# Patient Record
Sex: Female | Born: 1960 | Race: White | Hispanic: No | Smoking: Current every day smoker
Health system: Southern US, Community
[De-identification: ages and names within clinical notes are randomized; demographics above are authoritative.]

## PROBLEM LIST (undated history)

## (undated) DIAGNOSIS — I10 Essential (primary) hypertension: Secondary | ICD-10-CM

## (undated) DIAGNOSIS — E119 Type 2 diabetes mellitus without complications: Secondary | ICD-10-CM

## (undated) HISTORY — PX: ABDOMINAL HYSTERECTOMY: SHX81

## (undated) HISTORY — DX: Essential (primary) hypertension: I10

## (undated) HISTORY — DX: Type 2 diabetes mellitus without complications: E11.9

---

## 1997-06-03 ENCOUNTER — Emergency Department (HOSPITAL_COMMUNITY): Admission: EM | Admit: 1997-06-03 | Discharge: 1997-06-03 | Payer: Self-pay | Admitting: Emergency Medicine

## 1998-05-24 ENCOUNTER — Ambulatory Visit (HOSPITAL_COMMUNITY): Admission: RE | Admit: 1998-05-24 | Discharge: 1998-05-24 | Payer: Self-pay | Admitting: *Deleted

## 1998-05-24 ENCOUNTER — Encounter: Payer: Self-pay | Admitting: Family Medicine

## 1999-04-10 ENCOUNTER — Other Ambulatory Visit: Admission: RE | Admit: 1999-04-10 | Discharge: 1999-04-10 | Payer: Self-pay | Admitting: Obstetrics and Gynecology

## 1999-05-30 ENCOUNTER — Ambulatory Visit (HOSPITAL_COMMUNITY): Admission: RE | Admit: 1999-05-30 | Discharge: 1999-05-30 | Payer: Self-pay | Admitting: Obstetrics and Gynecology

## 2000-07-15 ENCOUNTER — Encounter: Payer: Self-pay | Admitting: Internal Medicine

## 2000-07-15 ENCOUNTER — Encounter: Admission: RE | Admit: 2000-07-15 | Discharge: 2000-07-15 | Payer: Self-pay | Admitting: Internal Medicine

## 2001-04-18 ENCOUNTER — Other Ambulatory Visit: Admission: RE | Admit: 2001-04-18 | Discharge: 2001-04-18 | Payer: Self-pay | Admitting: Obstetrics and Gynecology

## 2001-05-02 ENCOUNTER — Observation Stay (HOSPITAL_COMMUNITY): Admission: RE | Admit: 2001-05-02 | Discharge: 2001-05-03 | Payer: Self-pay | Admitting: Obstetrics and Gynecology

## 2005-03-19 ENCOUNTER — Other Ambulatory Visit: Admission: RE | Admit: 2005-03-19 | Discharge: 2005-03-19 | Payer: Self-pay | Admitting: Obstetrics and Gynecology

## 2010-05-23 ENCOUNTER — Ambulatory Visit: Payer: Self-pay | Admitting: Cardiology

## 2018-12-22 ENCOUNTER — Other Ambulatory Visit (HOSPITAL_COMMUNITY): Payer: Self-pay | Admitting: Obstetrics and Gynecology

## 2018-12-22 DIAGNOSIS — R011 Cardiac murmur, unspecified: Secondary | ICD-10-CM

## 2018-12-27 ENCOUNTER — Ambulatory Visit (HOSPITAL_COMMUNITY): Payer: 59

## 2019-02-14 ENCOUNTER — Encounter: Payer: Self-pay | Admitting: Obstetrics and Gynecology

## 2019-02-21 ENCOUNTER — Encounter: Payer: Self-pay | Admitting: General Practice

## 2019-03-06 ENCOUNTER — Encounter: Payer: Self-pay | Admitting: Obstetrics and Gynecology

## 2019-03-13 ENCOUNTER — Telehealth: Payer: Self-pay

## 2019-03-13 NOTE — Telephone Encounter (Signed)
NOTES ON FILE FROM DR JOHN MCCOMB [phone], REFERRAL SENT TO SCHEDULING 

## 2019-04-03 ENCOUNTER — Encounter (INDEPENDENT_AMBULATORY_CARE_PROVIDER_SITE_OTHER): Payer: Self-pay

## 2019-04-03 ENCOUNTER — Ambulatory Visit: Payer: 59 | Admitting: Internal Medicine

## 2019-04-03 ENCOUNTER — Other Ambulatory Visit: Payer: Self-pay

## 2019-04-03 ENCOUNTER — Encounter: Payer: Self-pay | Admitting: Internal Medicine

## 2019-04-03 VITALS — BP 152/64 | HR 53 | Temp 94.8°F | Ht 63.0 in | Wt 152.8 lb

## 2019-04-03 DIAGNOSIS — I10 Essential (primary) hypertension: Secondary | ICD-10-CM | POA: Diagnosis not present

## 2019-04-03 DIAGNOSIS — I358 Other nonrheumatic aortic valve disorders: Secondary | ICD-10-CM | POA: Diagnosis not present

## 2019-04-03 DIAGNOSIS — R001 Bradycardia, unspecified: Secondary | ICD-10-CM | POA: Diagnosis not present

## 2019-04-03 NOTE — Patient Instructions (Signed)
Medication Instructions:  No changes *If you need a refill on your cardiac medications before your next appointment, please call your pharmacy*  Lab Work: Not needed  Testing/Procedures: Not needed  Follow-Up: At [facility], you and your health needs are our priority.  As part of our continuing mission to provide you with exceptional heart care, we have created designated Provider Care Teams.  These Care Teams include your primary Cardiologist (physician) and Advanced Practice Providers (APPs -  Physician Assistants and Nurse Practitioners) who all work together to provide you with the care you need, when you need it.  Your next appointment:   3 month(s)  The format for your next appointment:   In Person  Provider:   Cherlynn Kaiser, MD  Other Instructions  n/a    Coping with Quitting Smoking  Quitting smoking is a physical and mental challenge. You will face cravings, withdrawal symptoms, and temptation. Before quitting, work with your health care provider to make a plan that can help you cope. Preparation can help you quit and keep you from giving in. How can I cope with cravings? Cravings usually last for 5-10 minutes. If you get through it, the craving will pass. Consider taking the following actions to help you cope with cravings:  Keep your mouth busy: ? Chew sugar-free gum. ? Suck on hard candies or a straw. ? Brush your teeth.  Keep your hands and body busy: ? Immediately change to a different activity when you feel a craving. ? Squeeze or play with a ball. ? Do an activity or a hobby, like making bead jewelry, practicing needlepoint, or working with wood. ? Mix up your normal routine. ? Take a short exercise break. Go for a quick walk or run up and down stairs. ? Spend time in public places where smoking is not allowed.  Focus on doing something kind or helpful for someone else.  Call a friend or family member to talk during a craving.  Join a support  group.  Call a quit line, such as 1-800-QUIT-NOW.  Talk with your health care provider about medicines that might help you cope with cravings and make quitting easier for you. How can I deal with withdrawal symptoms? Your body may experience negative effects as it tries to get used to not having nicotine in the system. These effects are called withdrawal symptoms. They may include:  Feeling hungrier than normal.  Trouble concentrating.  Irritability.  Trouble sleeping.  Feeling depressed.  Restlessness and agitation.  Craving a cigarette. To manage withdrawal symptoms:  Avoid places, people, and activities that trigger your cravings.  Remember why you want to quit.  Get plenty of sleep.  Avoid coffee and other caffeinated drinks. These may worsen some of your symptoms. How can I handle social situations? Social situations can be difficult when you are quitting smoking, especially in the first few weeks. To manage this, you can:  Avoid parties, bars, and other social situations where people might be smoking.  Avoid alcohol.  Leave right away if you have the urge to smoke.  Explain to your family and friends that you are quitting smoking. Ask for understanding and support.  Plan activities with friends or family where smoking is not an option. What are some ways I can cope with stress? Wanting to smoke may cause stress, and stress can make you want to smoke. Find ways to manage your stress. Relaxation techniques can help. For example:  Breathe slowly and deeply, in through your  nose and out through your mouth.  Listen to soothing, relaxing music.  Talk with a family member or friend about your stress.  Light a candle.  Soak in a bath or take a shower.  Think about a peaceful place. What are some ways I can prevent weight gain? Be aware that many people gain weight after they quit smoking. However, not everyone does. To keep from gaining weight, have a plan in  place before you quit and stick to the plan after you quit. Your plan should include:  Having healthy snacks. When you have a craving, it may help to: ? Eat plain popcorn, crunchy carrots, celery, or other cut vegetables. ? Chew sugar-free gum.  Changing how you eat: ? Eat small portion sizes at meals. ? Eat 4-6 small meals throughout the day instead of 1-2 large meals a day. ? Be mindful when you eat. Do not watch television or do other things that might distract you as you eat.  Exercising regularly: ? Make time to exercise each day. If you do not have time for a long workout, do short bouts of exercise for 5-10 minutes several times a day. ? Do some form of strengthening exercise, like weight lifting, and some form of aerobic exercise, like running or swimming.  Drinking plenty of water or other low-calorie or no-calorie drinks. Drink 6-8 glasses of water daily, or as much as instructed by your health care provider. Summary  Quitting smoking is a physical and mental challenge. You will face cravings, withdrawal symptoms, and temptation to smoke again. Preparation can help you as you go through these challenges.  You can cope with cravings by keeping your mouth busy (such as by chewing gum), keeping your body and hands busy, and making calls to family, friends, or a helpline for people who want to quit smoking.  You can cope with withdrawal symptoms by avoiding places where people smoke, avoiding drinks with caffeine, and getting plenty of rest.  Ask your health care provider about the different ways to prevent weight gain, avoid stress, and handle social situations. This information is not intended to replace advice given to you by your health care provider. Make sure you discuss any questions you have with your health care provider. Document Revised: 01/22/2017 Document Reviewed: 02/07/2016 Elsevier Patient Education  2020 [employer].    Mediterranean Diet A Mediterranean diet  refers to food and lifestyle choices that are based on the traditions of countries located on the [employer] [place]. This way of eating has been shown to help prevent certain conditions and improve outcomes for people who have chronic diseases, like kidney disease and heart disease. What are tips for following this plan? Lifestyle  Cook and eat meals together with your family, when possible.  Drink enough fluid to keep your urine clear or pale yellow.  Be physically active every day. This includes: ? Aerobic exercise like running or swimming. ? Leisure activities like gardening, walking, or housework.  Get 7-8 hours of sleep each night.  If recommended by your health care provider, drink red wine in moderation. This means 1 glass a day for nonpregnant women and 2 glasses a day for men. A glass of wine equals 5 oz (150 mL). Reading food labels   Check the serving size of packaged foods. For foods such as rice and pasta, the serving size refers to the amount of cooked product, not dry.  Check the total fat in packaged foods. Avoid foods that have saturated fat  or trans fats.  Check the ingredients list for added sugars, such as corn syrup. Shopping  At the grocery store, buy most of your food from the areas near the walls of the store. This includes: ? Fresh fruits and vegetables (produce). ? Grains, beans, nuts, and seeds. Some of these may be available in unpackaged forms or large amounts (in bulk). ? Fresh seafood. ? Poultry and eggs. ? Low-fat dairy products.  Buy whole ingredients instead of prepackaged foods.  Buy fresh fruits and vegetables in-season from local farmers markets.  Buy frozen fruits and vegetables in resealable bags.  If you do not have access to quality fresh seafood, buy precooked frozen shrimp or canned fish, such as tuna, salmon, or sardines.  Buy small amounts of raw or cooked vegetables, salads, or olives from the deli or salad bar at your  store.  Stock your pantry so you always have certain foods on hand, such as olive oil, canned tuna, canned tomatoes, rice, pasta, and beans. Cooking  Cook foods with extra-virgin olive oil instead of using butter or other vegetable oils.  Have meat as a side dish, and have vegetables or grains as your main dish. This means having meat in small portions or adding small amounts of meat to foods like pasta or stew.  Use beans or vegetables instead of meat in common dishes like chili or lasagna.  Experiment with different cooking methods. Try roasting or broiling vegetables instead of steaming or sauteing them.  Add frozen vegetables to soups, stews, pasta, or rice.  Add nuts or seeds for added healthy fat at each meal. You can add these to yogurt, salads, or vegetable dishes.  Marinate fish or vegetables using olive oil, lemon juice, garlic, and fresh herbs. Meal planning   Plan to eat 1 vegetarian meal one day each week. Try to work up to 2 vegetarian meals, if possible.  Eat seafood 2 or more times a week.  Have healthy snacks readily available, such as: ? Vegetable sticks with hummus. ? [place] yogurt. ? Fruit and nut trail mix.  Eat balanced meals throughout the week. This includes: ? Fruit: 2-3 servings a day ? Vegetables: 4-5 servings a day ? Low-fat dairy: 2 servings a day ? Fish, poultry, or lean meat: 1 serving a day ? Beans and legumes: 2 or more servings a week ? Nuts and seeds: 1-2 servings a day ? Whole grains: 6-8 servings a day ? Extra-virgin olive oil: 3-4 servings a day  Limit red meat and sweets to only a few servings a month What are my food choices?  Mediterranean diet ? Recommended  Grains: Whole-grain pasta. Brown rice. Bulgar wheat. Polenta. Couscous. Whole-wheat bread. Orpah Cobb.  Vegetables: Artichokes. Beets. Broccoli. Cabbage. Carrots. Eggplant. Green beans. Chard. Kale. Spinach. Onions. Leeks. Peas. Squash. Tomatoes. Peppers.  Radishes.  Fruits: Apples. Apricots. Avocado. Berries. Bananas. Cherries. Dates. Figs. Grapes. Lemons. Melon. Oranges. Peaches. Plums. Pomegranate.  Meats and other protein foods: Beans. Almonds. Sunflower seeds. Pine nuts. Peanuts. Cod. Salmon. Scallops. Shrimp. Tuna. Tilapia. Clams. Oysters. Eggs.  Dairy: Low-fat milk. Cheese. Greek yogurt.  Beverages: Water. Red wine. Herbal tea.  Fats and oils: Extra virgin olive oil. Avocado oil. Grape seed oil.  Sweets and desserts: [place] yogurt with honey. Baked apples. Poached pears. Trail mix.  Seasoning and other foods: Basil. Cilantro. Coriander. Cumin. Mint. Parsley. Sage. Rosemary. Tarragon. Garlic. Oregano. Thyme. Pepper. Balsalmic vinegar. Tahini. Hummus. Tomato sauce. Olives. Mushrooms. ? Limit these  Grains: Prepackaged pasta or rice  dishes. Prepackaged cereal with added sugar.  Vegetables: Deep fried potatoes (french fries).  Fruits: Fruit canned in syrup.  Meats and other protein foods: Beef. Pork. Lamb. Poultry with skin. Hot dogs. Tomasa Blase.  Dairy: Ice cream. Sour cream. Whole milk.  Beverages: Juice. Sugar-sweetened soft drinks. Beer. Liquor and spirits.  Fats and oils: Butter. Canola oil. Vegetable oil. Beef fat (tallow). Lard.  Sweets and desserts: Cookies. Cakes. Pies. Candy.  Seasoning and other foods: Mayonnaise. Premade sauces and marinades. The items listed may not be a complete list. Talk with your dietitian about what dietary choices are right for you. Summary  The Mediterranean diet includes both food and lifestyle choices.  Eat a variety of fresh fruits and vegetables, beans, nuts, seeds, and whole grains.  Limit the amount of red meat and sweets that you eat.  Talk with your health care provider about whether it is safe for you to drink red wine in moderation. This means 1 glass a day for nonpregnant women and 2 glasses a day for men. A glass of wine equals 5 oz (150 mL). This information is not intended to  replace advice given to you by your health care provider. Make sure you discuss any questions you have with your health care provider. Document Revised: 10/10/2015 Document Reviewed: 10/03/2015 Elsevier Patient Education  2020 [employer].   The Journal of Orthopaedic and Sports Physical Therapy, 44(10), 748. FishingAward.fi.2014.0506">  How to Increase Your Level of Physical Activity Getting regular physical activity is important for your overall health and well-being. Most people do not get enough exercise. There are easy ways to increase your level of physical activity, even if you have not been very active in the past or if you are just starting out. How can increasing my physical activity affect me? Physical activity has many short-term and long-term benefits. Being active on a regular basis can improve your physical and mental health as well as provide other benefits. Physical health benefits  Helping you lose weight or maintain a healthy weight.  Strengthening your muscles and bones.  Reducing your risk of certain long-term (chronic) diseases, including heart disease, cancer, and diabetes.  Being able to move around more easily and for longer periods of time without getting tired (increased stamina).  Improving your ability to fight off illness (enhanced immunity).  Being able to sleep better.  Helping you stay healthy as you get older, including: ? Helping you stay mobile, or capable of walking and moving around. ? Preventing accidents, such as falls. ? Increasing life expectancy. Mental health benefits  Boosting your mood and improving your self-esteem.  Lowering your chance of having mental health problems, such as depression or anxiety.  Helping you feel good about your body. Other benefits  Finding new sources of fun and enjoyment.  Meeting new people who share a common interest. What steps can I take to be more physically active? Getting  started  If you have a chronic illness or have not been active for a while, check with your health care provider about how to get started. Ask your health care provider what activities are safe for you.  Start out slowly. Walking or doing some simple chair exercises is a good place to start, especially if you have not been active before or for a long time.  Set goals that you can work toward. Ask your health care provider how much exercise is best for you. In general, most adults should: ? Do moderate-intensity exercise for at least  150 minutes each week (30 minutes on most days of the week) or vigorous exercise for at least 75 minutes each week, or a combination of these.  Moderate-intensity exercise can include walking at a quick pace, biking, yoga, water aerobics, or gardening.  Vigorous exercise involves activities that take more effort, such as jogging or running, playing sports, swimming laps, or jumping rope. ? Do strength exercises on at least 2 days each week. This can include weight lifting, body weight exercises, and resistance-band exercises.  Consider using a fitness tracker, such as a mobile phone app or a device worn like a watch, that will count the number of steps you take each day. Many people strive to reach 10,000 steps a day. Choosing activities  Try to find activities that you enjoy. You are more likely to commit to an exercise routine if it does not feel like a chore.  If you have bone or joint problems, choose low-impact exercises, like walking or swimming.  Use these tips for being successful with an exercise plan: ? Find a workout partner for accountability. ? Join a group or class, such as an aerobics class, cycling class, or sports team. ? Make family time active. Go for a walk, bike, or swim. ? Include a variety of exercises each week. Being active in your daily routines Besides your formal exercise plans, you can find ways to do physical activity during your  daily routines, such as:  Walking or biking to work or to the store.  Taking the stairs instead of the elevator.  Parking farther away from the door at work or at the store.  Planning walking meetings.  Walking around while you are on the phone.  Where to find more information  Centers for Disease Control and Prevention: CampusCasting.com.pt  President's Council on Fitness, Sports & Nutrition: www.fitness.gov  ChooseMyPlate: https://ball-collins.biz/ Contact a health care provider if:  You have headaches, muscle aches, or joint pain.  You feel dizzy or light-headed while exercising.  You faint.  You have chest pain while exercising. Summary  Exercise benefits your mind and body at any age, even if you are just starting out.  If you have a chronic illness or have not been active for a while, check with your health care provider before increasing your physical activity.  Choose activities that are safe and enjoyable for you. Ask your health care provider what activities are safe for you.  Start slowly. Tell your health care provider if you have problems as you start to increase your activity level. This information is not intended to replace advice given to you by your health care provider. Make sure you discuss any questions you have with your health care provider. Document Revised: 11/14/2018 Document Reviewed: 09/05/2018 Elsevier Patient Education  2020 [employer].

## 2019-04-03 NOTE — Progress Notes (Signed)
Cardiology Office Note:    Date:  04/03/2019   ID:  Samantha Cooley, DOB 1960/08/01, MRN [id number]  PCP:  Richardean Chimera, MD  Cardiologist:  No primary care provider on file.  Electrophysiologist:  None   Referring MD: Richardean Chimera, MD   Chief Complaint: Heart murmur  History of Present Illness:    Samantha Cooley is a 59 y.o. female with a history of hysterectomy for abnormal bleeding, diabetes, hypertension who presents today for evaluation of a systolic murmur noted at her gynecologist office.  She tells me she is asymptomatic from a cardiopulmonary standpoint.  She denies chest discomfort when she takes walks with her husband which is her primary activity.  They also ride [place], she does not drive she rides in the back.  She denies dyspnea on exertion, palpitations, PND, orthopnea, syncope.  She has a history of hypertension and takes metoprolol succinate 50 mg daily as well as spironolactone-HCTZ 25-25 mg daily.  Smoking - cig 40 year 1ppd.  Currently vaping in an attempt to quit smoking.  Past Medical History:  Diagnosis Date  . Diabetes (HCC)   . HTN (hypertension)     Past Surgical History:  Procedure Laterality Date  . ABDOMINAL HYSTERECTOMY    . CESAREAN SECTION      Current Medications: Current Meds  Medication Sig  . ALPRAZolam (XANAX) 1 MG tablet Take 1 mg by mouth 3 (three) times daily as needed.  Marland Kitchen escitalopram (LEXAPRO) 20 MG tablet Take 20 mg by mouth daily.  Marland Kitchen HYDROcodone-acetaminophen (NORCO) 10-325 MG tablet Take 1/2 to 1 tablet three times a day as needed for pain  . metFORMIN (GLUCOPHAGE-XR) 500 MG 24 hr tablet Take 1 tablet twice a day, at breakfast and supper, for control of diabetes.  . metoprolol succinate (TOPROL-XL) 50 MG 24 hr tablet TAKE 1 TABLET BY MOUTH ONCE DAILY FOR BLOOD PRESSURE AND TO PREVENT PALPITATIONS  . omeprazole (PRILOSEC) 20 MG capsule TAKE ONE CAPSULE BY MOUTH TWICE DAILY TO CONTROL ACID REFLUX  .  spironolactone-hydrochlorothiazide (ALDACTAZIDE) 25-25 MG tablet TAKE ONE TABLET BY MOUTH ONCE DAILY  . SUMAtriptan (IMITREX) 50 MG tablet Take one tablet at the earliest sign of a migraine  . zolpidem (AMBIEN) 10 MG tablet TAKE 1/2 TO 1 (ONE-HALF TO ONE) TABLET BY MOUTH AT BEDTIME AS NEEDED FOR INSOMNIA     Allergies:   Patient has no allergy information on record.   Social History   Socioeconomic History  . Marital status: Divorced    Spouse name: Not on file  . Number of children: Not on file  . Years of education: Not on file  . Highest education level: Not on file  Occupational History  . Not on file  Tobacco Use  . Smoking status: Current Every Day Smoker    Types: E-cigarettes, Cigarettes  . Smokeless tobacco: Former Engineer, water and Sexual Activity  . Alcohol use: Yes  . Drug use: Yes    Types: Marijuana    Comment: once in a while  . Sexual activity: Yes  Other Topics Concern  . Not on file  Social History Narrative  . Not on file   Social Determinants of Health   Financial Resource Strain:   . Difficulty of Paying Living Expenses: Not on file  Food Insecurity:   . Worried About Programme researcher, broadcasting/film/video in the Last Year: Not on file  . Ran Out of Food in the Last Year: Not on file  Transportation Needs:   .  Lack of Transportation (Medical): Not on file  . Lack of Transportation (Non-Medical): Not on file  Physical Activity:   . Days of Exercise per Week: Not on file  . Minutes of Exercise per Session: Not on file  Stress:   . Feeling of Stress : Not on file  Social Connections:   . Frequency of Communication with Friends and Family: Not on file  . Frequency of Social Gatherings with Friends and Family: Not on file  . Attends Religious Services: Not on file  . Active Member of Clubs or Organizations: Not on file  . Attends Banker Meetings: Not on file  . Marital Status: Not on file     Family History: The patient's family history includes  Cancer in her mother.  ROS:   Please see the history of present illness.    All other systems reviewed and are negative.  EKGs/Labs/Other Studies Reviewed:    The following studies were reviewed today:  EKG:  Sinus bradycardia, rate 53.   Recent Labs: No results found for requested labs within last 8760 hours.  Recent Lipid Panel No results found for: CHOL, TRIG, HDL, CHOLHDL, VLDL, LDLCALC, LDLDIRECT  Physical Exam:    VS:  BP (!) 152/64   Pulse (!) 53   Temp (!) 94.8 F (34.9 C)   Ht 5\' 3"  (1.6 m)   Wt 152 lb 12.8 oz (69.3 kg)   SpO2 99%   BMI 27.07 kg/m     Wt Readings from Last 5 Encounters:  04/03/19 152 lb 12.8 oz (69.3 kg)     Constitutional: No acute distress Eyes: sclera non-icteric, normal conjunctiva and lids ENMT: normal dentition, moist mucous membranes Cardiovascular: regular rhythm, normal rate, 2/6 early peaking systolic ejection murmur.  S1 and S2 normal. Radial pulses normal bilaterally. No jugular venous distention.  Respiratory: clear to auscultation bilaterally GI : normal bowel sounds, soft and nontender. No distention.   MSK: extremities warm, well perfused. No edema.  NEURO: grossly nonfocal exam, moves all extremities. PSYCH: alert and oriented x 3, normal mood and affect.   ASSESSMENT:    1. Aortic valve sclerosis   2. Bradycardia, sinus   3. Essential hypertension    PLAN:    Aortic valve sclerosis-her systolic murmur heard on physical exam appears to be aortic valve sclerosis based on recent outside echocardiogram.  Mean gradient is not provided, however the peak gradient is noted to be 12 mmHg.  We discussed how aortic valve sclerosis creates a murmur.  She has mild aortic valve regurgitation and mild tricuspid valve regurgitation.  Given the mild aortic valve regurgitation, we will follow her with echocardiograms intermittently.  Sinus bradycardia-likely secondary to beta-blocker therapy for hypertension.  Not symptomatic,  continue.  Hypertension-she takes metoprolol succinate 50 mg daily, this is also for the indication of palpitations.  She also takes spironolactone HCTZ 25-25 mg daily.  She tells me her blood pressure when checked at [facility] is 125/85.  No change to therapy today.  At follow-up if blood pressure continues to be elevated, we will discuss change in therapy.  Tobacco use-currently vaping in hopes to quit smoking, in the contemplative phase.  I have encouraged smoking cessation.  Total time of encounter: 60 minutes total time of encounter, including 35 minutes spent in face-to-face patient care. This time includes coordination of care and counseling regarding above mentioned problem list. Remainder of non-face-to-face time involved reviewing chart documents/testing relevant to the patient encounter and documentation in the medical  record. I have independently reviewed documentation from referring provider.  Approximately 32 pages of outside medical records were reviewed in preparation for this consultation.  Weston BrassGayatri Dan Dissinger, MD [place]  [facility]    Medication Adjustments/Labs and Tests Ordered: Current medicines are reviewed at length with the patient today.  Concerns regarding medicines are outlined above.  Orders Placed This Encounter  Procedures  . EKG 12-Lead   No orders of the defined types were placed in this encounter.   Patient Instructions  Medication Instructions:  No changes *If you need a refill on your cardiac medications before your next appointment, please call your pharmacy*  Lab Work: Not needed  Testing/Procedures: Not needed  Follow-Up: [facility], you and your health needs are our priority.  As part of our continuing mission to provide you with exceptional heart care, we have created designated Provider Care Teams.  These Care Teams include your primary Cardiologist (physician) and Advanced Practice Providers (APPs -  Physician Assistants and Nurse  Practitioners) who all work together to provide you with the care you need, when you need it.  Your next appointment:   3 month(s)  The format for your next appointment:   In Person  Provider:   Weston BrassGayatri Rehan Holness, MD  Other Instructions  n/a    Coping with Quitting Smoking  Quitting smoking is a physical and mental challenge. You will face cravings, withdrawal symptoms, and temptation. Before quitting, work with your health care provider to make a plan that can help you cope. Preparation can help you quit and keep you from giving in. How can I cope with cravings? Cravings usually last for 5-10 minutes. If you get through it, the craving will pass. Consider taking the following actions to help you cope with cravings:  Keep your mouth busy: ? Chew sugar-free gum. ? Suck on hard candies or a straw. ? Brush your teeth.  Keep your hands and body busy: ? Immediately change to a different activity when you feel a craving. ? Squeeze or play with a ball. ? Do an activity or a hobby, like making bead jewelry, practicing needlepoint, or working with wood. ? Mix up your normal routine. ? Take a short exercise break. Go for a quick walk or run up and down stairs. ? Spend time in public places where smoking is not allowed.  Focus on doing something kind or helpful for someone else.  Call a friend or family member to talk during a craving.  Join a support group.  Call a quit line, such as 1-800-QUIT-NOW.  Talk with your health care provider about medicines that might help you cope with cravings and make quitting easier for you. How can I deal with withdrawal symptoms? Your body may experience negative effects as it tries to get used to not having nicotine in the system. These effects are called withdrawal symptoms. They may include:  Feeling hungrier than normal.  Trouble concentrating.  Irritability.  Trouble sleeping.  Feeling depressed.  Restlessness and  agitation.  Craving a cigarette. To manage withdrawal symptoms:  Avoid places, people, and activities that trigger your cravings.  Remember why you want to quit.  Get plenty of sleep.  Avoid coffee and other caffeinated drinks. These may worsen some of your symptoms. How can I handle social situations? Social situations can be difficult when you are quitting smoking, especially in the first few weeks. To manage this, you can:  Avoid parties, bars, and other social situations where people might be  smoking.  Avoid alcohol.  Leave right away if you have the urge to smoke.  Explain to your family and friends that you are quitting smoking. Ask for understanding and support.  Plan activities with friends or family where smoking is not an option. What are some ways I can cope with stress? Wanting to smoke may cause stress, and stress can make you want to smoke. Find ways to manage your stress. Relaxation techniques can help. For example:  Breathe slowly and deeply, in through your nose and out through your mouth.  Listen to soothing, relaxing music.  Talk with a family member or friend about your stress.  Light a candle.  Soak in a bath or take a shower.  Think about a peaceful place. What are some ways I can prevent weight gain? Be aware that many people gain weight after they quit smoking. However, not everyone does. To keep from gaining weight, have a plan in place before you quit and stick to the plan after you quit. Your plan should include:  Having healthy snacks. When you have a craving, it may help to: ? Eat plain popcorn, crunchy carrots, celery, or other cut vegetables. ? Chew sugar-free gum.  Changing how you eat: ? Eat small portion sizes at meals. ? Eat 4-6 small meals throughout the day instead of 1-2 large meals a day. ? Be mindful when you eat. Do not watch television or do other things that might distract you as you eat.  Exercising regularly: ? Make time  to exercise each day. If you do not have time for a long workout, do short bouts of exercise for 5-10 minutes several times a day. ? Do some form of strengthening exercise, like weight lifting, and some form of aerobic exercise, like running or swimming.  Drinking plenty of water or other low-calorie or no-calorie drinks. Drink 6-8 glasses of water daily, or as much as instructed by your health care provider. Summary  Quitting smoking is a physical and mental challenge. You will face cravings, withdrawal symptoms, and temptation to smoke again. Preparation can help you as you go through these challenges.  You can cope with cravings by keeping your mouth busy (such as by chewing gum), keeping your body and hands busy, and making calls to family, friends, or a helpline for people who want to quit smoking.  You can cope with withdrawal symptoms by avoiding places where people smoke, avoiding drinks with caffeine, and getting plenty of rest.  Ask your health care provider about the different ways to prevent weight gain, avoid stress, and handle social situations. This information is not intended to replace advice given to you by your health care provider. Make sure you discuss any questions you have with your health care provider. Document Revised: 01/22/2017 Document Reviewed: 02/07/2016 Elsevier Patient Education  2020 [place] refers to food and lifestyle choices that are based on the traditions of countries located on the [employer]. This way of eating has been shown to help prevent certain conditions and improve outcomes for people who have chronic diseases, like kidney disease and heart disease. What are tips for following this plan? Lifestyle  Cook and eat meals together with your family, when possible.  Drink enough fluid to keep your urine clear or pale yellow.  Be physically active every day. This includes: ? Aerobic exercise  like running or swimming. ? Leisure activities like gardening, walking, or housework.  Get 7-8  hours of sleep each night.  If recommended by your health care provider, drink red wine in moderation. This means 1 glass a day for nonpregnant women and 2 glasses a day for men. A glass of wine equals 5 oz (150 mL). Reading food labels   Check the serving size of packaged foods. For foods such as rice and pasta, the serving size refers to the amount of cooked product, not dry.  Check the total fat in packaged foods. Avoid foods that have saturated fat or trans fats.  Check the ingredients list for added sugars, such as corn syrup. Shopping  At the grocery store, buy most of your food from the areas near the walls of the store. This includes: ? Fresh fruits and vegetables (produce). ? Grains, beans, nuts, and seeds. Some of these may be available in unpackaged forms or large amounts (in bulk). ? Fresh seafood. ? Poultry and eggs. ? Low-fat dairy products.  Buy whole ingredients instead of prepackaged foods.  Buy fresh fruits and vegetables in-season from local farmers markets.  Buy frozen fruits and vegetables in resealable bags.  If you do not have access to quality fresh seafood, buy precooked frozen shrimp or canned fish, such as tuna, salmon, or sardines.  Buy small amounts of raw or cooked vegetables, salads, or olives from the deli or salad bar at your store.  Stock your pantry so you always have certain foods on hand, such as olive oil, canned tuna, canned tomatoes, rice, pasta, and beans. Cooking  Cook foods with extra-virgin olive oil instead of using butter or other vegetable oils.  Have meat as a side dish, and have vegetables or grains as your main dish. This means having meat in small portions or adding small amounts of meat to foods like pasta or stew.  Use beans or vegetables instead of meat in common dishes like chili or lasagna.  Experiment with different cooking  methods. Try roasting or broiling vegetables instead of steaming or sauteing them.  Add frozen vegetables to soups, stews, pasta, or rice.  Add nuts or seeds for added healthy fat at each meal. You can add these to yogurt, salads, or vegetable dishes.  Marinate fish or vegetables using olive oil, lemon juice, garlic, and fresh herbs. Meal planning   Plan to eat 1 vegetarian meal one day each week. Try to work up to 2 vegetarian meals, if possible.  Eat seafood 2 or more times a week.  Have healthy snacks readily available, such as: ? Vegetable sticks with hummus. ? [place] yogurt. ? Fruit and nut trail mix.  Eat balanced meals throughout the week. This includes: ? Fruit: 2-3 servings a day ? Vegetables: 4-5 servings a day ? Low-fat dairy: 2 servings a day ? Fish, poultry, or lean meat: 1 serving a day ? Beans and legumes: 2 or more servings a week ? Nuts and seeds: 1-2 servings a day ? Whole grains: 6-8 servings a day ? Extra-virgin olive oil: 3-4 servings a day  Limit red meat and sweets to only a few servings a month What are my food choices?  Mediterranean diet ? Recommended  Grains: Whole-grain pasta. Brown rice. Bulgar wheat. Polenta. Couscous. Whole-wheat bread. [place] Cobbatmeal. Quinoa.  Vegetables: Artichokes. Beets. Broccoli. Cabbage. Carrots. Eggplant. Green beans. Chard. Kale. Spinach. Onions. Leeks. Peas. Squash. Tomatoes. Peppers. Radishes.  Fruits: Apples. Apricots. Avocado. Berries. Bananas. Cherries. Dates. Figs. Grapes. Lemons. Melon. Oranges. Peaches. Plums. Pomegranate.  Meats and other protein foods: Beans. Almonds. Sunflower seeds. Starbucks CorporationPine  nuts. Peanuts. Cod. Salmon. Scallops. Shrimp. Tuna. Tilapia. Clams. Oysters. Eggs.  Dairy: Low-fat milk. Cheese. Greek yogurt.  Beverages: Water. Red wine. Herbal tea.  Fats and oils: Extra virgin olive oil. Avocado oil. Grape seed oil.  Sweets and desserts: [place] yogurt with honey. Baked apples. Poached pears. Trail  mix.  Seasoning and other foods: Basil. Cilantro. Coriander. Cumin. Mint. Parsley. Sage. Rosemary. Tarragon. Garlic. Oregano. Thyme. Pepper. Balsalmic vinegar. Tahini. Hummus. Tomato sauce. Olives. Mushrooms. ? Limit these  Grains: Prepackaged pasta or rice dishes. Prepackaged cereal with added sugar.  Vegetables: Deep fried potatoes (french fries).  Fruits: Fruit canned in syrup.  Meats and other protein foods: Beef. Pork. Lamb. Poultry with skin. Hot dogs. Tomasa Blase.  Dairy: Ice cream. Sour cream. Whole milk.  Beverages: Juice. Sugar-sweetened soft drinks. Beer. Liquor and spirits.  Fats and oils: Butter. Canola oil. Vegetable oil. Beef fat (tallow). Lard.  Sweets and desserts: Cookies. Cakes. Pies. Candy.  Seasoning and other foods: Mayonnaise. Premade sauces and marinades. The items listed may not be a complete list. Talk with your dietitian about what dietary choices are right for you. Summary  The Mediterranean diet includes both food and lifestyle choices.  Eat a variety of fresh fruits and vegetables, beans, nuts, seeds, and whole grains.  Limit the amount of red meat and sweets that you eat.  Talk with your health care provider about whether it is safe for you to drink red wine in moderation. This means 1 glass a day for nonpregnant women and 2 glasses a day for men. A glass of wine equals 5 oz (150 mL). This information is not intended to replace advice given to you by your health care provider. Make sure you discuss any questions you have with your health care provider. Document Revised: 10/10/2015 Document Reviewed: 10/03/2015 Elsevier Patient Education  2020 [employer].   The Journal of Orthopaedic and Sports Physical Therapy, 44(10), 748. FishingAward.fi.2014.0506">  How to Increase Your Level of Physical Activity Getting regular physical activity is important for your overall health and well-being. Most people do not get enough exercise. There are  easy ways to increase your level of physical activity, even if you have not been very active in the past or if you are just starting out. How can increasing my physical activity affect me? Physical activity has many short-term and long-term benefits. Being active on a regular basis can improve your physical and mental health as well as provide other benefits. Physical health benefits  Helping you lose weight or maintain a healthy weight.  Strengthening your muscles and bones.  Reducing your risk of certain long-term (chronic) diseases, including heart disease, cancer, and diabetes.  Being able to move around more easily and for longer periods of time without getting tired (increased stamina).  Improving your ability to fight off illness (enhanced immunity).  Being able to sleep better.  Helping you stay healthy as you get older, including: ? Helping you stay mobile, or capable of walking and moving around. ? Preventing accidents, such as falls. ? Increasing life expectancy. Mental health benefits  Boosting your mood and improving your self-esteem.  Lowering your chance of having mental health problems, such as depression or anxiety.  Helping you feel good about your body. Other benefits  Finding new sources of fun and enjoyment.  Meeting new people who share a common interest. What steps can I take to be more physically active? Getting started  If you have a chronic illness or have not been active  for a while, check with your health care provider about how to get started. Ask your health care provider what activities are safe for you.  Start out slowly. Walking or doing some simple chair exercises is a good place to start, especially if you have not been active before or for a long time.  Set goals that you can work toward. Ask your health care provider how much exercise is best for you. In general, most adults should: ? Do moderate-intensity exercise for at least 150 minutes  each week (30 minutes on most days of the week) or vigorous exercise for at least 75 minutes each week, or a combination of these.  Moderate-intensity exercise can include walking at a quick pace, biking, yoga, water aerobics, or gardening.  Vigorous exercise involves activities that take more effort, such as jogging or running, playing sports, swimming laps, or jumping rope. ? Do strength exercises on at least 2 days each week. This can include weight lifting, body weight exercises, and resistance-band exercises.  Consider using a fitness tracker, such as a mobile phone app or a device worn like a watch, that will count the number of steps you take each day. Many people strive to reach 10,000 steps a day. Choosing activities  Try to find activities that you enjoy. You are more likely to commit to an exercise routine if it does not feel like a chore.  If you have bone or joint problems, choose low-impact exercises, like walking or swimming.  Use these tips for being successful with an exercise plan: ? Find a workout partner for accountability. ? Join a group or class, such as an aerobics class, cycling class, or sports team. ? Make family time active. Go for a walk, bike, or swim. ? Include a variety of exercises each week. Being active in your daily routines Besides your formal exercise plans, you can find ways to do physical activity during your daily routines, such as:  Walking or biking to work or to the store.  Taking the stairs instead of the elevator.  Parking farther away from the door at work or at the store.  Planning walking meetings.  Walking around while you are on the phone.  Where to find more information  Centers for Disease Control and Prevention: CampusCasting.com.pt  President's Council on Fitness, Sports & Nutrition: www.fitness.gov  ChooseMyPlate: https://ball-collins.biz/ Contact a health care provider if:  You have headaches, muscle aches, or joint  pain.  You feel dizzy or light-headed while exercising.  You faint.  You have chest pain while exercising. Summary  Exercise benefits your mind and body at any age, even if you are just starting out.  If you have a chronic illness or have not been active for a while, check with your health care provider before increasing your physical activity.  Choose activities that are safe and enjoyable for you. Ask your health care provider what activities are safe for you.  Start slowly. Tell your health care provider if you have problems as you start to increase your activity level. This information is not intended to replace advice given to you by your health care provider. Make sure you discuss any questions you have with your health care provider. Document Revised: 11/14/2018 Document Reviewed: 09/05/2018 Elsevier Patient Education  2020 [employer].

## 2019-05-12 ENCOUNTER — Ambulatory Visit: Payer: 59 | Attending: Internal Medicine

## 2019-05-12 DIAGNOSIS — Z23 Encounter for immunization: Secondary | ICD-10-CM

## 2019-05-12 NOTE — Progress Notes (Signed)
   Covid-19 Vaccination Clinic  Name:  Samantha Cooley    MRN: [id number] DOB: [birth year]  05/12/2019  Samantha Cooley was observed post Covid-19 immunization for 15 minutes without incident. She was provided with Vaccine Information Sheet and instruction to access the V-Safe system.   Samantha Cooley was instructed to call 911 with any severe reactions post vaccine: Marland Kitchen Difficulty breathing  . Swelling of face and throat  . A fast heartbeat  . A bad rash all over body  . Dizziness and weakness   Immunizations Administered    Name Date Dose VIS Date Route   Pfizer COVID-19 Vaccine 05/12/2019  1:38 PM 0.3 mL 02/03/2019 Intramuscular   Manufacturer: [employer], [employer]   Lot: [id number]   NDC: [address]-3

## 2019-06-06 ENCOUNTER — Ambulatory Visit: Payer: 59 | Attending: Internal Medicine

## 2019-06-06 DIAGNOSIS — Z23 Encounter for immunization: Secondary | ICD-10-CM

## 2019-06-06 NOTE — Progress Notes (Signed)
   Covid-19 Vaccination Clinic  Name:  Samantha Cooley    MRN: [id number] DOB: Jun 23, 1960  06/06/2019  [name] was observed post Covid-19 immunization for 15 minutes without incident. She was provided with Vaccine Information Sheet and instruction to access the V-Safe system.   [name] was instructed to call 911 with any severe reactions post vaccine: Marland Kitchen Difficulty breathing  . Swelling of face and throat  . A fast heartbeat  . A bad rash all over body  . Dizziness and weakness   Immunizations Administered    Name Date Dose VIS Date Route   Pfizer COVID-19 Vaccine 06/06/2019  2:28 PM 0.3 mL 02/03/2019 Intramuscular   Manufacturer: [employer], [employer]   Lot: [id number]   NDC: [address]-7

## 2019-07-12 ENCOUNTER — Ambulatory Visit: Payer: 59 | Admitting: Internal Medicine

## 2020-11-22 ENCOUNTER — Emergency Department (HOSPITAL_COMMUNITY): Payer: Self-pay

## 2020-11-22 ENCOUNTER — Encounter (HOSPITAL_COMMUNITY): Payer: Self-pay

## 2020-11-22 ENCOUNTER — Inpatient Hospital Stay (HOSPITAL_COMMUNITY)
Admission: EM | Admit: 2020-11-22 | Discharge: 2020-12-24 | DRG: 296 | Disposition: E | Payer: Self-pay | Attending: Internal Medicine | Admitting: Internal Medicine

## 2020-11-22 ENCOUNTER — Inpatient Hospital Stay (HOSPITAL_COMMUNITY): Payer: Self-pay

## 2020-11-22 ENCOUNTER — Other Ambulatory Visit: Payer: Self-pay

## 2020-11-22 DIAGNOSIS — Z79899 Other long term (current) drug therapy: Secondary | ICD-10-CM

## 2020-11-22 DIAGNOSIS — Z20822 Contact with and (suspected) exposure to covid-19: Secondary | ICD-10-CM | POA: Diagnosis present

## 2020-11-22 DIAGNOSIS — Z7984 Long term (current) use of oral hypoglycemic drugs: Secondary | ICD-10-CM

## 2020-11-22 DIAGNOSIS — R570 Cardiogenic shock: Secondary | ICD-10-CM | POA: Diagnosis present

## 2020-11-22 DIAGNOSIS — Z886 Allergy status to analgesic agent status: Secondary | ICD-10-CM

## 2020-11-22 DIAGNOSIS — Z90710 Acquired absence of both cervix and uterus: Secondary | ICD-10-CM

## 2020-11-22 DIAGNOSIS — R5381 Other malaise: Secondary | ICD-10-CM | POA: Diagnosis present

## 2020-11-22 DIAGNOSIS — G9349 Other encephalopathy: Secondary | ICD-10-CM | POA: Diagnosis present

## 2020-11-22 DIAGNOSIS — E785 Hyperlipidemia, unspecified: Secondary | ICD-10-CM | POA: Diagnosis present

## 2020-11-22 DIAGNOSIS — J9690 Respiratory failure, unspecified, unspecified whether with hypoxia or hypercapnia: Secondary | ICD-10-CM | POA: Insufficient documentation

## 2020-11-22 DIAGNOSIS — R000 Tachycardia, unspecified: Secondary | ICD-10-CM | POA: Diagnosis not present

## 2020-11-22 DIAGNOSIS — N179 Acute kidney failure, unspecified: Secondary | ICD-10-CM | POA: Diagnosis present

## 2020-11-22 DIAGNOSIS — F419 Anxiety disorder, unspecified: Secondary | ICD-10-CM | POA: Diagnosis present

## 2020-11-22 DIAGNOSIS — E8729 Other acidosis: Secondary | ICD-10-CM | POA: Diagnosis present

## 2020-11-22 DIAGNOSIS — J811 Chronic pulmonary edema: Secondary | ICD-10-CM

## 2020-11-22 DIAGNOSIS — J441 Chronic obstructive pulmonary disease with (acute) exacerbation: Secondary | ICD-10-CM | POA: Diagnosis present

## 2020-11-22 DIAGNOSIS — R778 Other specified abnormalities of plasma proteins: Secondary | ICD-10-CM | POA: Diagnosis present

## 2020-11-22 DIAGNOSIS — I10 Essential (primary) hypertension: Secondary | ICD-10-CM | POA: Diagnosis present

## 2020-11-22 DIAGNOSIS — F17290 Nicotine dependence, other tobacco product, uncomplicated: Secondary | ICD-10-CM | POA: Diagnosis present

## 2020-11-22 DIAGNOSIS — Z515 Encounter for palliative care: Secondary | ICD-10-CM

## 2020-11-22 DIAGNOSIS — G8191 Hemiplegia, unspecified affecting right dominant side: Secondary | ICD-10-CM | POA: Diagnosis not present

## 2020-11-22 DIAGNOSIS — F32A Depression, unspecified: Secondary | ICD-10-CM | POA: Diagnosis present

## 2020-11-22 DIAGNOSIS — R578 Other shock: Secondary | ICD-10-CM | POA: Diagnosis present

## 2020-11-22 DIAGNOSIS — Z8673 Personal history of transient ischemic attack (TIA), and cerebral infarction without residual deficits: Secondary | ICD-10-CM

## 2020-11-22 DIAGNOSIS — F5105 Insomnia due to other mental disorder: Secondary | ICD-10-CM | POA: Diagnosis present

## 2020-11-22 DIAGNOSIS — D751 Secondary polycythemia: Secondary | ICD-10-CM | POA: Diagnosis present

## 2020-11-22 DIAGNOSIS — R0902 Hypoxemia: Secondary | ICD-10-CM

## 2020-11-22 DIAGNOSIS — I447 Left bundle-branch block, unspecified: Secondary | ICD-10-CM | POA: Diagnosis present

## 2020-11-22 DIAGNOSIS — J690 Pneumonitis due to inhalation of food and vomit: Secondary | ICD-10-CM | POA: Diagnosis present

## 2020-11-22 DIAGNOSIS — Z66 Do not resuscitate: Secondary | ICD-10-CM | POA: Diagnosis not present

## 2020-11-22 DIAGNOSIS — Z888 Allergy status to other drugs, medicaments and biological substances status: Secondary | ICD-10-CM

## 2020-11-22 DIAGNOSIS — Z4659 Encounter for fitting and adjustment of other gastrointestinal appliance and device: Secondary | ICD-10-CM

## 2020-11-22 DIAGNOSIS — Q210 Ventricular septal defect: Secondary | ICD-10-CM

## 2020-11-22 DIAGNOSIS — I330 Acute and subacute infective endocarditis: Secondary | ICD-10-CM | POA: Diagnosis present

## 2020-11-22 DIAGNOSIS — J9601 Acute respiratory failure with hypoxia: Secondary | ICD-10-CM | POA: Diagnosis present

## 2020-11-22 DIAGNOSIS — G4700 Insomnia, unspecified: Secondary | ICD-10-CM | POA: Diagnosis present

## 2020-11-22 DIAGNOSIS — E11649 Type 2 diabetes mellitus with hypoglycemia without coma: Secondary | ICD-10-CM | POA: Diagnosis not present

## 2020-11-22 DIAGNOSIS — I351 Nonrheumatic aortic (valve) insufficiency: Secondary | ICD-10-CM | POA: Diagnosis present

## 2020-11-22 DIAGNOSIS — R140 Abdominal distension (gaseous): Secondary | ICD-10-CM | POA: Diagnosis not present

## 2020-11-22 DIAGNOSIS — E669 Obesity, unspecified: Secondary | ICD-10-CM | POA: Diagnosis present

## 2020-11-22 DIAGNOSIS — E1165 Type 2 diabetes mellitus with hyperglycemia: Secondary | ICD-10-CM | POA: Diagnosis present

## 2020-11-22 DIAGNOSIS — R29716 NIHSS score 16: Secondary | ICD-10-CM | POA: Diagnosis not present

## 2020-11-22 DIAGNOSIS — E8770 Fluid overload, unspecified: Secondary | ICD-10-CM | POA: Diagnosis not present

## 2020-11-22 DIAGNOSIS — J9602 Acute respiratory failure with hypercapnia: Secondary | ICD-10-CM | POA: Diagnosis present

## 2020-11-22 DIAGNOSIS — G931 Anoxic brain damage, not elsewhere classified: Secondary | ICD-10-CM | POA: Diagnosis present

## 2020-11-22 DIAGNOSIS — I469 Cardiac arrest, cause unspecified: Principal | ICD-10-CM | POA: Diagnosis present

## 2020-11-22 DIAGNOSIS — I6340 Cerebral infarction due to embolism of unspecified cerebral artery: Secondary | ICD-10-CM | POA: Diagnosis not present

## 2020-11-22 DIAGNOSIS — R579 Shock, unspecified: Secondary | ICD-10-CM

## 2020-11-22 DIAGNOSIS — Z6830 Body mass index (BMI) 30.0-30.9, adult: Secondary | ICD-10-CM

## 2020-11-22 LAB — LACTIC ACID, PLASMA
Lactic Acid, Venous: >9 mmol/L (ref 0.5–1.9)
Lactic Acid, Venous: 6.3 mmol/L (ref 0.5–1.9)

## 2020-11-22 LAB — RESP PANEL BY RT-PCR (FLU A&B, COVID) ARPGX2
Influenza A by PCR: NEGATIVE
Influenza B by PCR: NEGATIVE
SARS Coronavirus 2 by RT PCR: NEGATIVE

## 2020-11-22 LAB — URINALYSIS, ROUTINE W REFLEX MICROSCOPIC
Bilirubin Urine: NEGATIVE
Glucose, UA: >=500 mg/dL — AB
Ketones, ur: NEGATIVE mg/dL
Leukocytes,Ua: NEGATIVE
Nitrite: NEGATIVE
Protein, ur: 100 mg/dL — AB
Specific Gravity, Urine: 1.02 (ref 1.005–1.030)
pH: 5 (ref 5.0–8.0)

## 2020-11-22 LAB — I-STAT VENOUS BLOOD GAS, ED
Acid-base deficit: 22 mmol/L — ABNORMAL HIGH (ref 0.0–2.0)
Bicarbonate: 12.8 mmol/L — ABNORMAL LOW (ref 20.0–28.0)
Calcium, Ion: 1 mmol/L — ABNORMAL LOW (ref 1.15–1.40)
HCT: 50 % — ABNORMAL HIGH (ref 36.0–46.0)
Hemoglobin: 17 g/dL — ABNORMAL HIGH (ref 12.0–15.0)
O2 Saturation: 50 %
Potassium: 3.5 mmol/L (ref 3.5–5.1)
Sodium: 130 mmol/L — ABNORMAL LOW (ref 135–145)
TCO2: 15 mmol/L — ABNORMAL LOW (ref 22–32)
pCO2, Ven: 69.2 mmHg — ABNORMAL HIGH (ref 44.0–60.0)
pH, Ven: 6.875 — CL (ref 7.250–7.430)
pO2, Ven: 46 mmHg — ABNORMAL HIGH (ref 32.0–45.0)

## 2020-11-22 LAB — I-STAT ARTERIAL BLOOD GAS, ED
Acid-base deficit: 12 mmol/L — ABNORMAL HIGH (ref 0.0–2.0)
Bicarbonate: 17.6 mmol/L — ABNORMAL LOW (ref 20.0–28.0)
Calcium, Ion: 1.09 mmol/L — ABNORMAL LOW (ref 1.15–1.40)
HCT: 49 % — ABNORMAL HIGH (ref 36.0–46.0)
Hemoglobin: 16.7 g/dL — ABNORMAL HIGH (ref 12.0–15.0)
O2 Saturation: 89 %
Patient temperature: 95.7
Potassium: 3.5 mmol/L (ref 3.5–5.1)
Sodium: 132 mmol/L — ABNORMAL LOW (ref 135–145)
TCO2: 19 mmol/L — ABNORMAL LOW (ref 22–32)
pCO2 arterial: 50.2 mmHg — ABNORMAL HIGH (ref 32.0–48.0)
pH, Arterial: 7.142 — CL (ref 7.350–7.450)
pO2, Arterial: 69 mmHg — ABNORMAL LOW (ref 83.0–108.0)

## 2020-11-22 LAB — CBC WITH DIFFERENTIAL/PLATELET
Abs Immature Granulocytes: 0.83 10*3/uL — ABNORMAL HIGH (ref 0.00–0.07)
Basophils Absolute: 0.1 10*3/uL (ref 0.0–0.1)
Basophils Relative: 1 %
Eosinophils Absolute: 0.1 10*3/uL (ref 0.0–0.5)
Eosinophils Relative: 1 %
HCT: 48.6 % — ABNORMAL HIGH (ref 36.0–46.0)
Hemoglobin: 15.3 g/dL — ABNORMAL HIGH (ref 12.0–15.0)
Immature Granulocytes: 5 %
Lymphocytes Relative: 29 %
Lymphs Abs: 5.3 10*3/uL — ABNORMAL HIGH (ref 0.7–4.0)
MCH: 33.3 pg (ref 26.0–34.0)
MCHC: 31.5 g/dL (ref 30.0–36.0)
MCV: 105.9 fL — ABNORMAL HIGH (ref 80.0–100.0)
Monocytes Absolute: 0.4 10*3/uL (ref 0.1–1.0)
Monocytes Relative: 2 %
Neutro Abs: 11.4 10*3/uL — ABNORMAL HIGH (ref 1.7–7.7)
Neutrophils Relative %: 62 %
Platelets: 149 10*3/uL — ABNORMAL LOW (ref 150–400)
RBC: 4.59 MIL/uL (ref 3.87–5.11)
RDW: 12.9 % (ref 11.5–15.5)
WBC: 18.3 10*3/uL — ABNORMAL HIGH (ref 4.0–10.5)
nRBC: 0.2 % (ref 0.0–0.2)

## 2020-11-22 LAB — I-STAT CHEM 8, ED
BUN: 21 mg/dL (ref 8–23)
Calcium, Ion: 0.99 mmol/L — ABNORMAL LOW (ref 1.15–1.40)
Chloride: 97 mmol/L — ABNORMAL LOW (ref 98–111)
Creatinine, Ser: 1.4 mg/dL — ABNORMAL HIGH (ref 0.44–1.00)
Glucose, Bld: 409 mg/dL — ABNORMAL HIGH (ref 70–99)
HCT: 50 % — ABNORMAL HIGH (ref 36.0–46.0)
Hemoglobin: 17 g/dL — ABNORMAL HIGH (ref 12.0–15.0)
Potassium: 3.6 mmol/L (ref 3.5–5.1)
Sodium: 129 mmol/L — ABNORMAL LOW (ref 135–145)
TCO2: 17 mmol/L — ABNORMAL LOW (ref 22–32)

## 2020-11-22 LAB — POCT I-STAT 7, (LYTES, BLD GAS, ICA,H+H)
Acid-base deficit: 7 mmol/L — ABNORMAL HIGH (ref 0.0–2.0)
Bicarbonate: 22.2 mmol/L (ref 20.0–28.0)
Calcium, Ion: 0.99 mmol/L — ABNORMAL LOW (ref 1.15–1.40)
HCT: 44 % (ref 36.0–46.0)
Hemoglobin: 15 g/dL (ref 12.0–15.0)
O2 Saturation: 90 %
Patient temperature: 36.7
Potassium: 4.1 mmol/L (ref 3.5–5.1)
Sodium: 137 mmol/L (ref 135–145)
TCO2: 24 mmol/L (ref 22–32)
pCO2 arterial: 56.2 mmHg — ABNORMAL HIGH (ref 32.0–48.0)
pH, Arterial: 7.204 — ABNORMAL LOW (ref 7.350–7.450)
pO2, Arterial: 70 mmHg — ABNORMAL LOW (ref 83.0–108.0)

## 2020-11-22 LAB — BASIC METABOLIC PANEL
Anion gap: 16 — ABNORMAL HIGH (ref 5–15)
Anion gap: 18 — ABNORMAL HIGH (ref 5–15)
BUN: 19 mg/dL (ref 8–23)
BUN: 20 mg/dL (ref 8–23)
CO2: 15 mmol/L — ABNORMAL LOW (ref 22–32)
CO2: 13 mmol/L — ABNORMAL LOW (ref 22–32)
Calcium: 8.2 mg/dL — ABNORMAL LOW (ref 8.9–10.3)
Calcium: 8.2 mg/dL — ABNORMAL LOW (ref 8.9–10.3)
Chloride: 99 mmol/L (ref 98–111)
Chloride: 100 mmol/L (ref 98–111)
Creatinine, Ser: 1.35 mg/dL — ABNORMAL HIGH (ref 0.44–1.00)
Creatinine, Ser: 1.35 mg/dL — ABNORMAL HIGH (ref 0.44–1.00)
GFR, Estimated: 44 mL/min — ABNORMAL LOW (ref >60)
GFR, Estimated: 44 mL/min — ABNORMAL LOW (ref >60)
Glucose, Bld: 242 mg/dL — ABNORMAL HIGH (ref 70–99)
Glucose, Bld: 214 mg/dL — ABNORMAL HIGH (ref 70–99)
Potassium: 5.5 mmol/L — ABNORMAL HIGH (ref 3.5–5.1)
Potassium: 5 mmol/L (ref 3.5–5.1)
Sodium: 130 mmol/L — ABNORMAL LOW (ref 135–145)
Sodium: 131 mmol/L — ABNORMAL LOW (ref 135–145)

## 2020-11-22 LAB — RAPID URINE DRUG SCREEN, HOSP PERFORMED
Amphetamines: NOT DETECTED
Barbiturates: NOT DETECTED
Benzodiazepines: POSITIVE — AB
Cocaine: NOT DETECTED
Opiates: NOT DETECTED
Tetrahydrocannabinol: POSITIVE — AB

## 2020-11-22 LAB — CK: Total CK: 574 U/L — ABNORMAL HIGH (ref 38–234)

## 2020-11-22 LAB — MAGNESIUM
Magnesium: 2.3 mg/dL (ref 1.7–2.4)
Magnesium: 1.9 mg/dL (ref 1.7–2.4)

## 2020-11-22 LAB — BETA-HYDROXYBUTYRIC ACID: Beta-Hydroxybutyric Acid: 0.23 mmol/L (ref 0.05–0.27)

## 2020-11-22 LAB — PHOSPHORUS: Phosphorus: 7.6 mg/dL — ABNORMAL HIGH (ref 2.5–4.6)

## 2020-11-22 LAB — PROCALCITONIN: Procalcitonin: 2.11 ng/mL

## 2020-11-22 LAB — SALICYLATE LEVEL: Salicylate Lvl: <7 mg/dL — ABNORMAL LOW (ref 7.0–30.0)

## 2020-11-22 LAB — GLUCOSE, CAPILLARY
Glucose-Capillary: 212 mg/dL — ABNORMAL HIGH (ref 70–99)
Glucose-Capillary: 205 mg/dL — ABNORMAL HIGH (ref 70–99)
Glucose-Capillary: 202 mg/dL — ABNORMAL HIGH (ref 70–99)

## 2020-11-22 LAB — CBG MONITORING, ED: Glucose-Capillary: 372 mg/dL — ABNORMAL HIGH (ref 70–99)

## 2020-11-22 LAB — ACETAMINOPHEN LEVEL: Acetaminophen (Tylenol), Serum: <10 ug/mL — ABNORMAL LOW (ref 10–30)

## 2020-11-22 LAB — TROPONIN I (HIGH SENSITIVITY): Troponin I (High Sensitivity): 1684 ng/L (ref <18)

## 2020-11-22 IMAGING — CT CT CERVICAL SPINE W/O CM
3 of 4 series · 12 of 35 positions shown, 14 images · non-contrast
Comparison: None.

CLINICAL DATA: Cardiac arrest

EXAM:
CT HEAD WITHOUT CONTRAST
CT CERVICAL SPINE WITHOUT CONTRAST
TECHNIQUE: Multidetector CT imaging of the head and cervical spine was
performed following the standard protocol without intravenous
contrast. Multiplanar CT image reconstructions of the cervical spine
were also generated.

[Series 3: c_spine 2.0 st · axial · 0.38mm/px · z∈[-276,-166]mm · 4 of 83 slices shown, 5 images]
[im 14/83  soft-tissue]
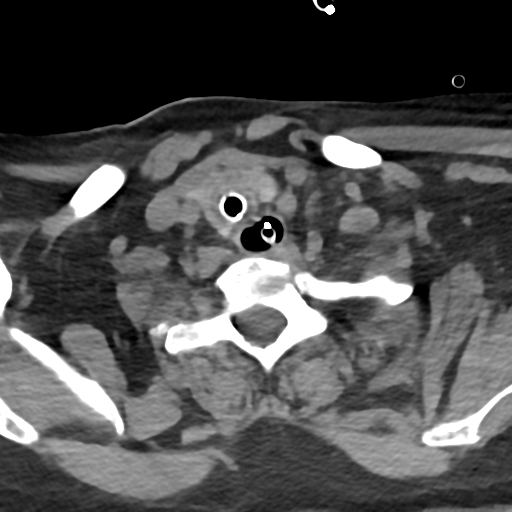
[im 14/83  bone]
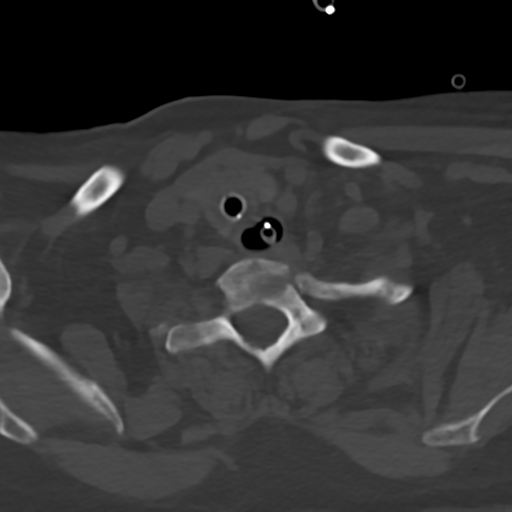
[im 28/83  bone]
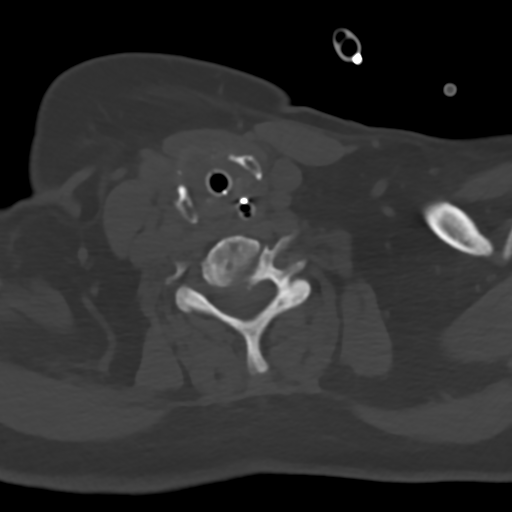
[im 55/83  bone]
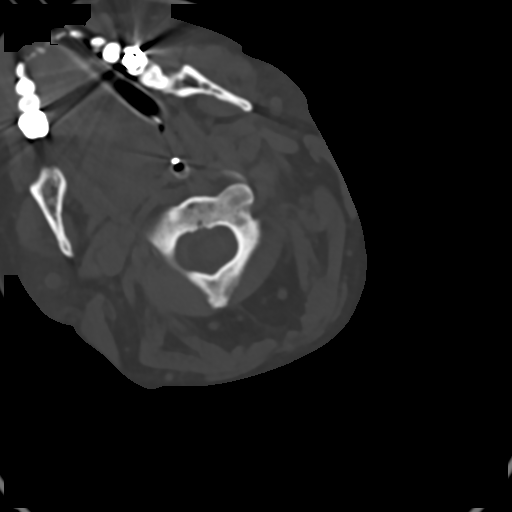
[im 69/83  bone]
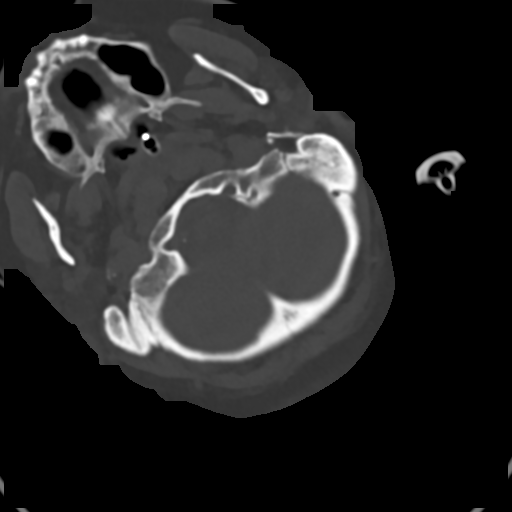

[Series 8: c_spine 2.0 sag bone · sagittal · 0.23mm/px · 5 of 61 slices shown, 6 images]
[im 21/61  bone]
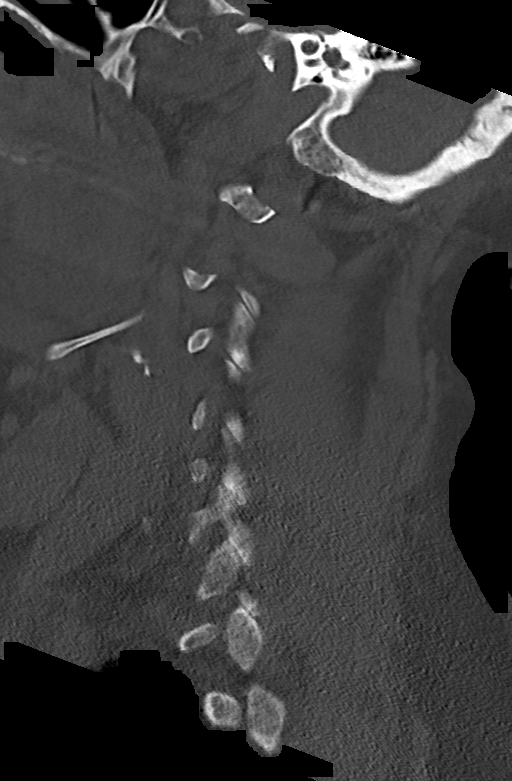
[im 26/61  bone]
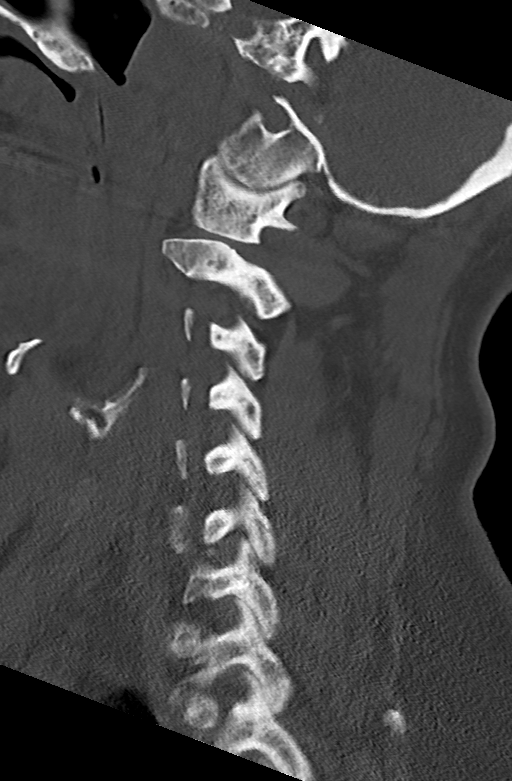
[im 31/61  soft-tissue]
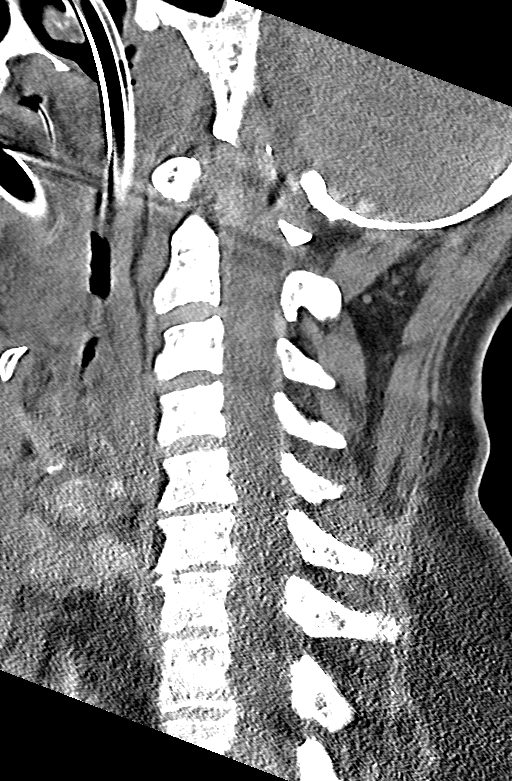
[im 31/61  bone]
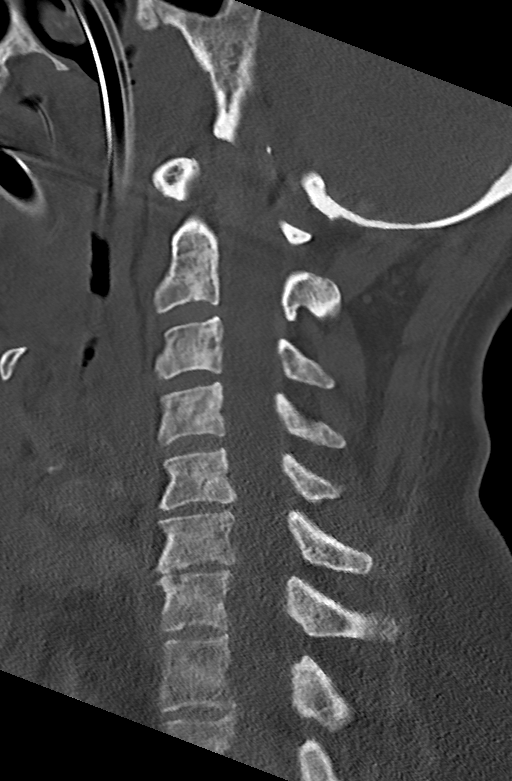
[im 36/61  bone]
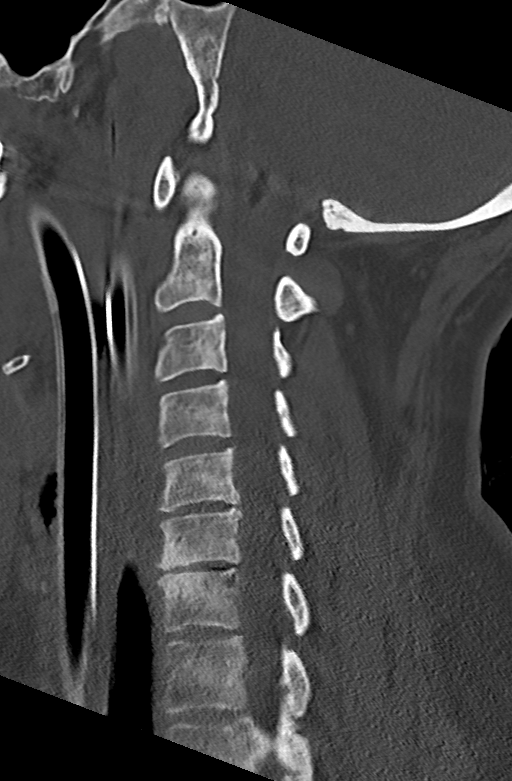
[im 41/61  bone]
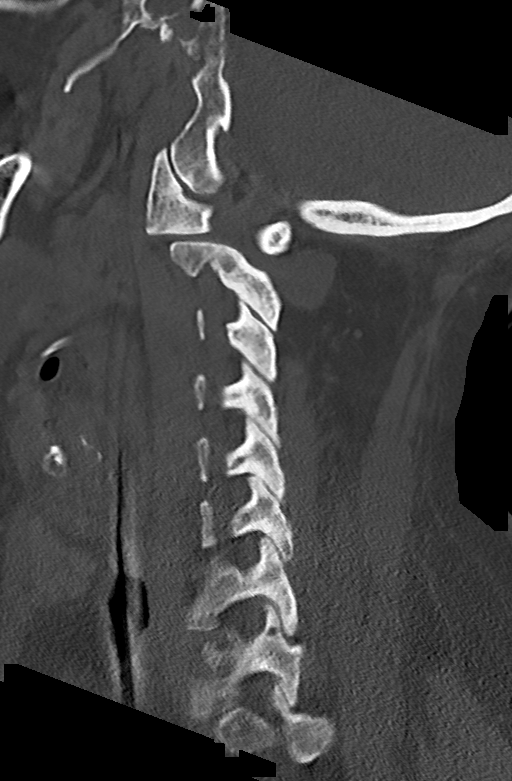

[Series 9: c_spine 2.0 cor bone · coronal · 0.23mm/px · 3 of 61 slices shown]
[im 13/61  bone]
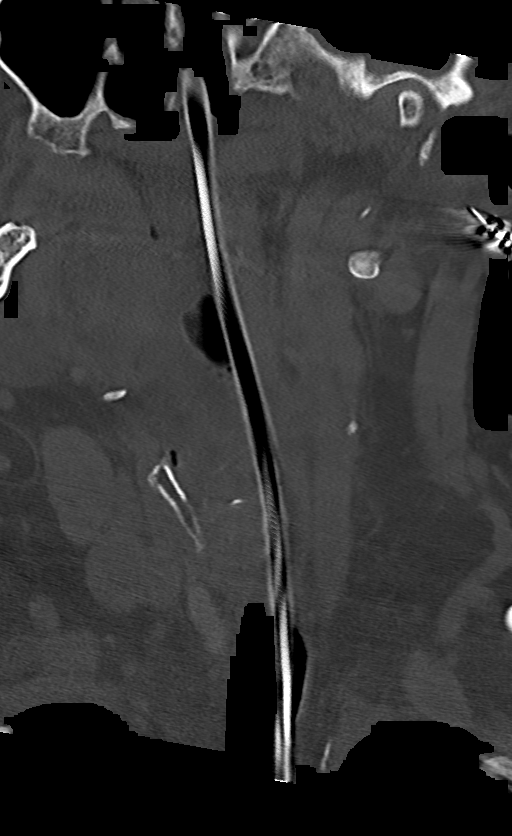
[im 25/61  bone]
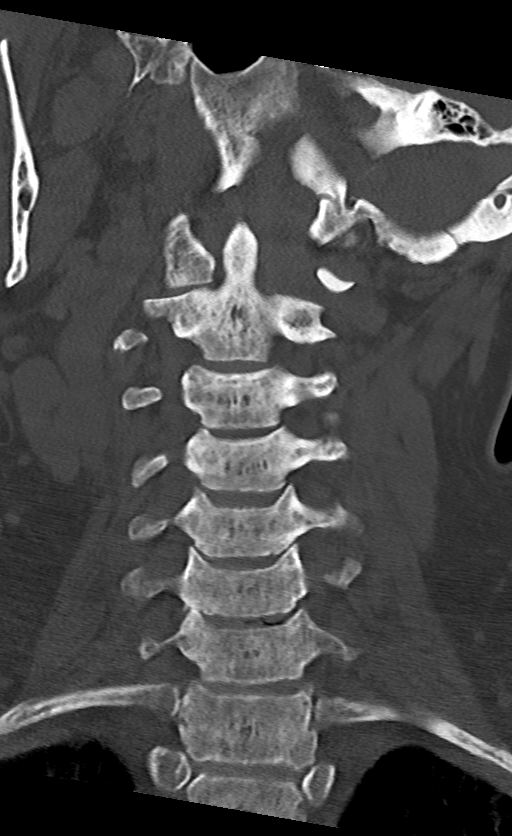
[im 37/61  bone]
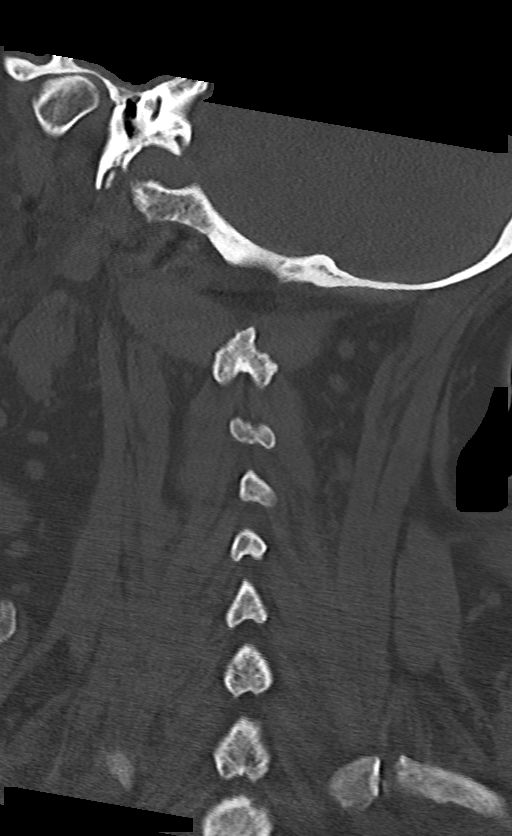

[12 of 35 positions shown; findings below may reference images not displayed]

FINDINGS: CT HEAD FINDINGS

Brain: No evidence of acute infarction, hemorrhage, hydrocephalus,
extra-axial collection or mass lesion/mass effect.

Vascular: No hyperdense vessel or unexpected calcification.

Skull: Normal. Negative for fracture or focal lesion.

Sinuses/Orbits: No acute finding.

Other: None.

CT CERVICAL SPINE FINDINGS

Alignment: Normal.

Skull base and vertebrae: No acute fracture. No primary bone lesion
or focal pathologic process.

Soft tissues and spinal canal: No prevertebral fluid or swelling. No
visible canal hematoma.

Disc levels: Mild disc space height loss and osteophytosis of the
lower cervical levels.

Upper chest: Please see separately dictated examination of the
chest.

Other: None.
IMPRESSION: 1. No acute intracranial pathology.
2. No fracture or static subluxation of the cervical spine.
3. Mild multilevel cervical disc degenerative disease.

## 2020-11-22 IMAGING — DX DG CHEST 1V PORT
1 series · 1 of 1 positions shown · non-contrast
Comparison: None.

CLINICAL DATA: Cardiac arrest, ETT placement

EXAM:
PORTABLE CHEST 1 VIEW

[chest ap]
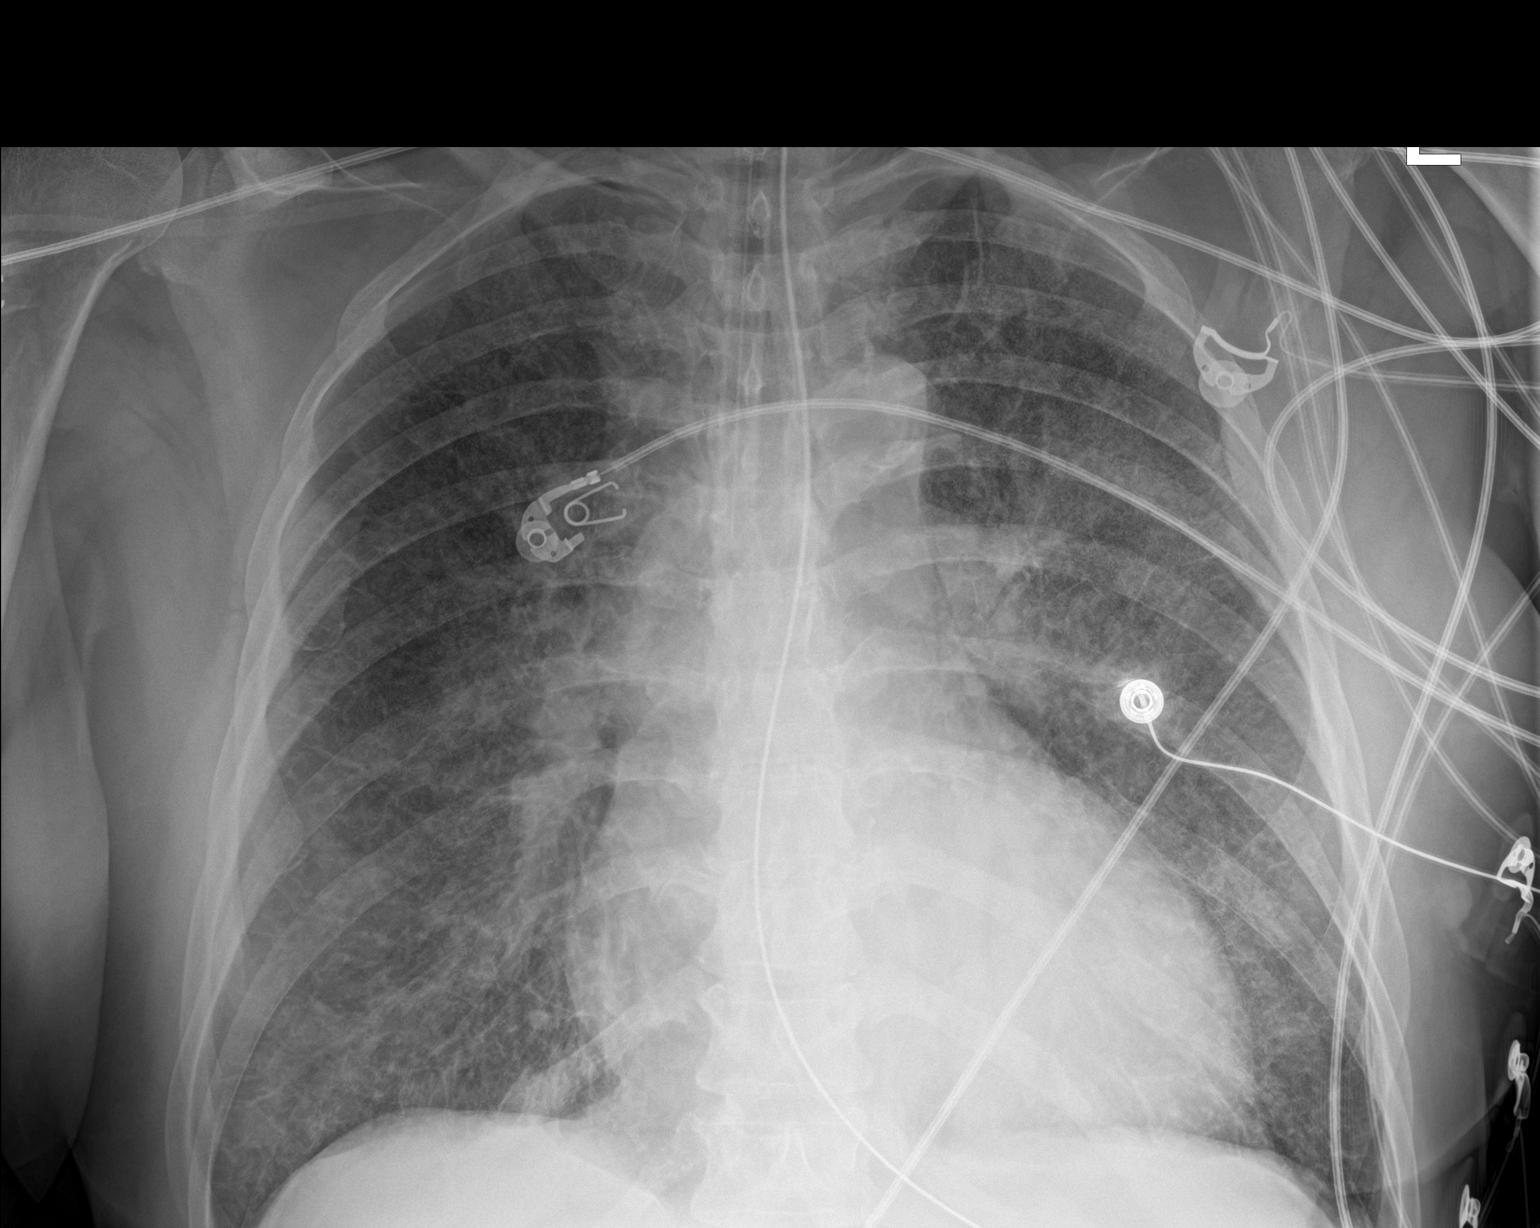

[1 of 1 positions shown; findings below may reference images not displayed]

FINDINGS: Cardiomegaly. Diffuse bilateral interstitial pulmonary opacity.
Endotracheal tube is positioned with tip at the carina.
Esophagogastric tube with tip and side port below the diaphragm.
IMPRESSION: 1. Endotracheal tube is positioned with tip at the carina. Recommend
retraction.
2. Esophagogastric tube with tip and side port below the diaphragm.
3. Diffuse bilateral interstitial pulmonary opacity, likely edema in
the setting of cardiomegaly.

## 2020-11-22 IMAGING — DX DG CHEST 1V PORT
1 series · 1 of 1 positions shown · non-contrast
Comparison: Chest x-ray [DATE].

CLINICAL DATA: Endotracheal tube repositioned.

EXAM:
PORTABLE CHEST 1 VIEW

[chest ap]
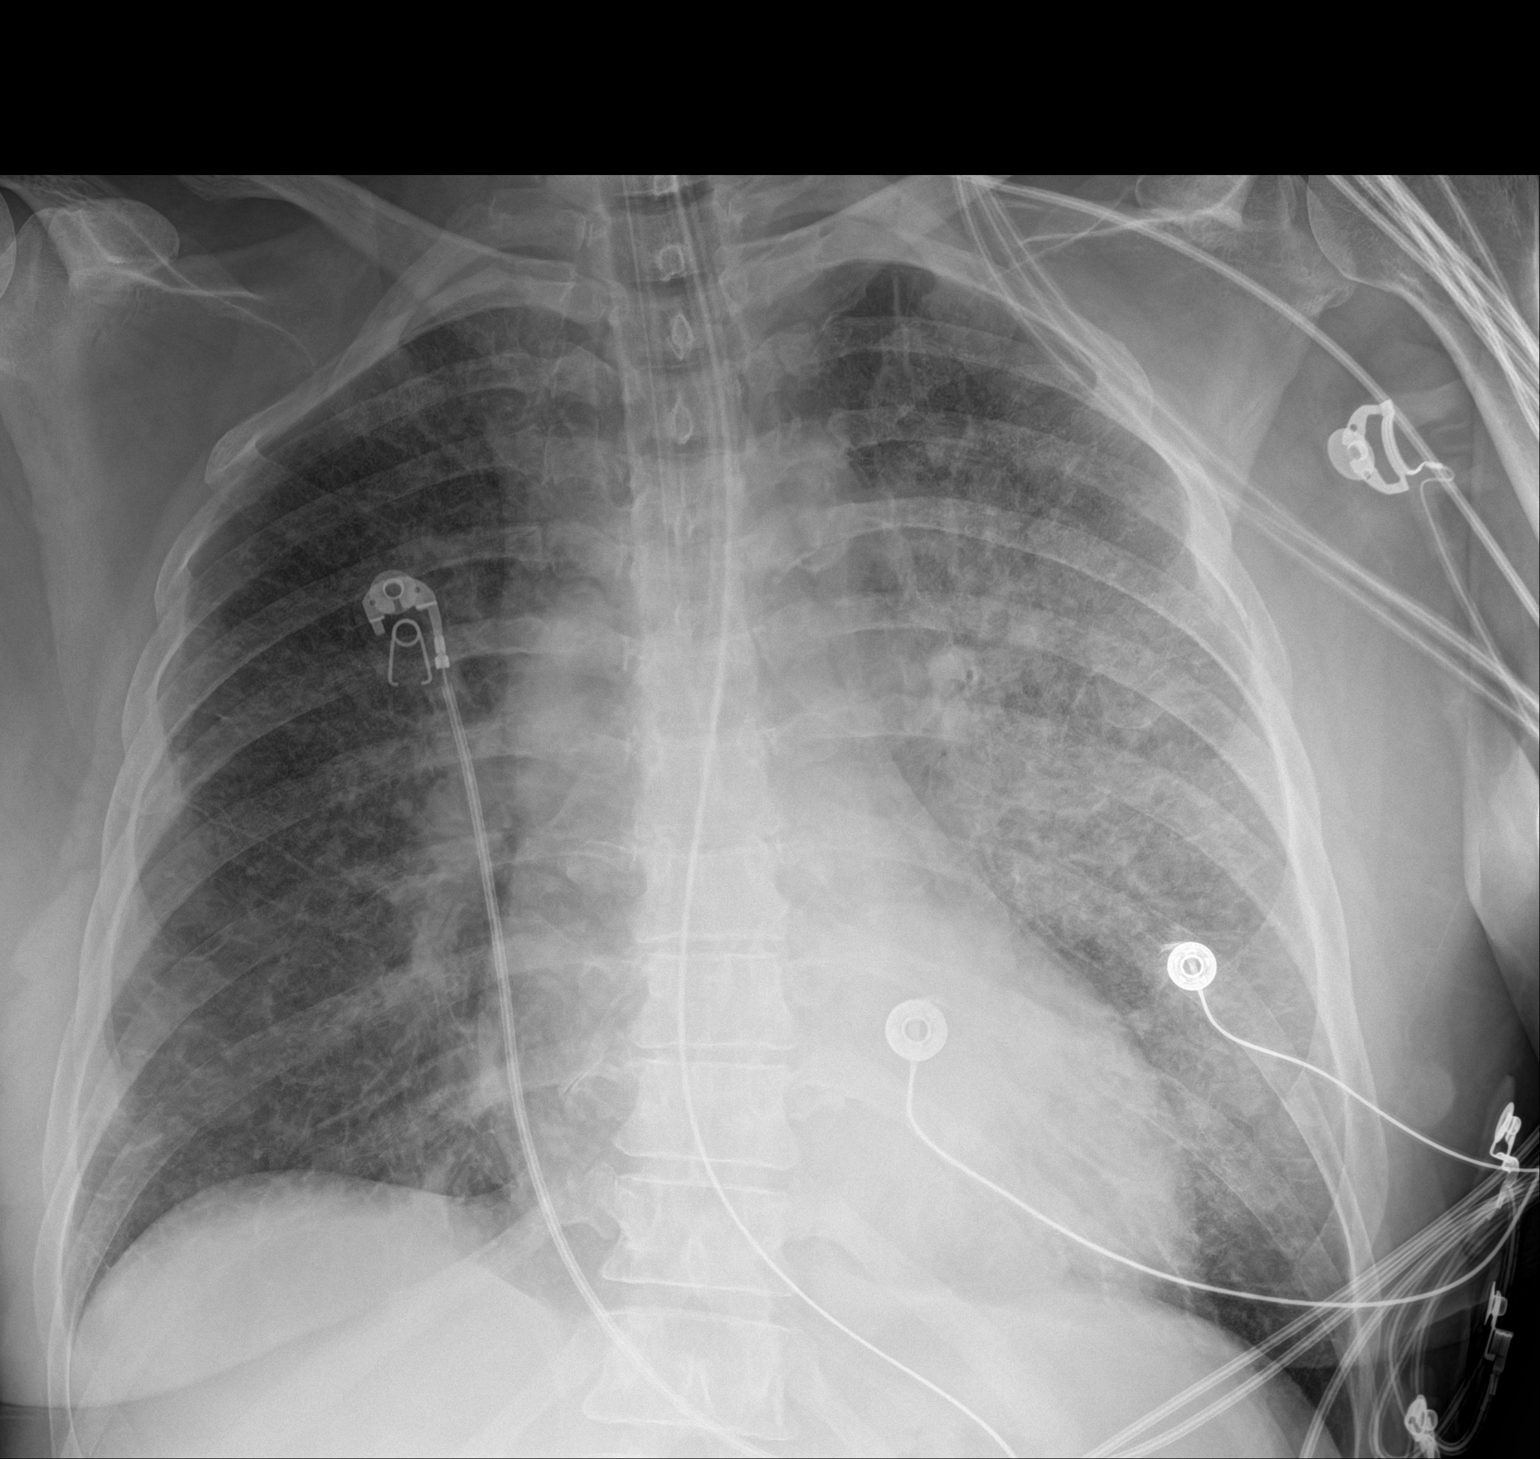

[1 of 1 positions shown; findings below may reference images not displayed]

FINDINGS: Endotracheal tube tip is just at the level of the carina. Enteric
tube extends below the diaphragm.

The cardiomediastinal silhouette is stable, the heart is mildly
enlarged. Diffuse interstitial opacities are again seen. There are
increasing airspace opacities throughout the left mid lung.
Costophrenic angles are clear. There is no pneumothorax.
IMPRESSION: 1. Endotracheal tube tip at the level of the carina. Recommend
repositioning.
2. Findings suspicious for pulmonary edema, increasing on the left.

## 2020-11-22 IMAGING — CT CT HEAD W/O CM
2 series · 15 of 30 positions shown, 17 images · non-contrast
Comparison: None.

CLINICAL DATA: Cardiac arrest

EXAM:
CT HEAD WITHOUT CONTRAST
CT CERVICAL SPINE WITHOUT CONTRAST
TECHNIQUE: Multidetector CT imaging of the head and cervical spine was
performed following the standard protocol without intravenous
contrast. Multiplanar CT image reconstructions of the cervical spine
were also generated.

[Series 3: head without · axial · non-contrast · 0.42mm/px · z∈[-176,-42]mm · 7 of 37 slices shown, 9 images]
[im 5/37  brain]
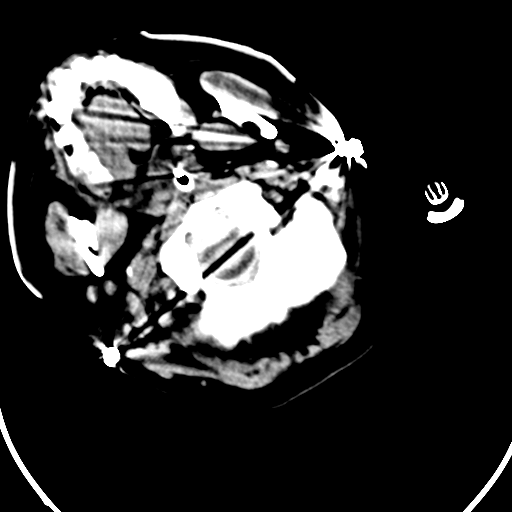
[im 5/37  bone]
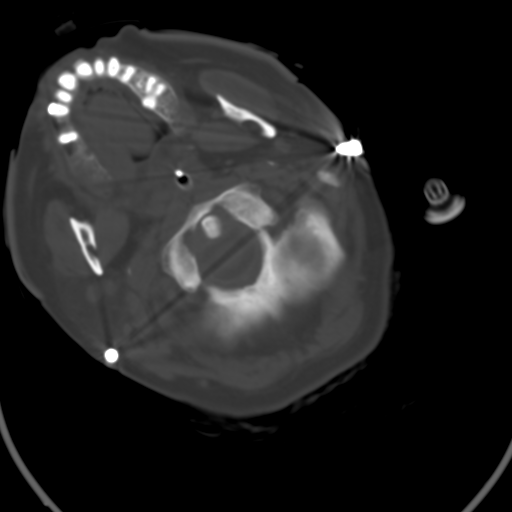
[im 10/37  brain]
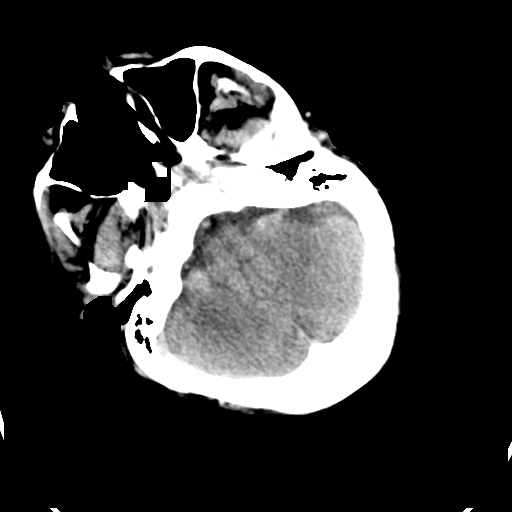
[im 14/37  brain]
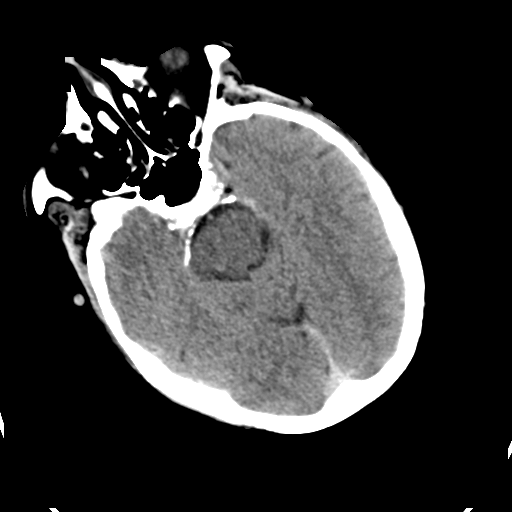
[im 19/37  brain]
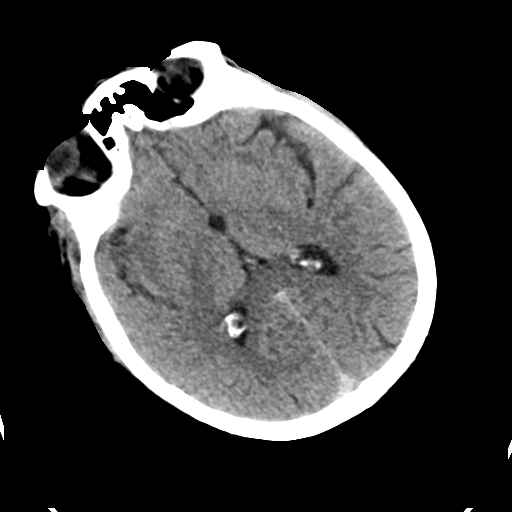
[im 23/37  brain]
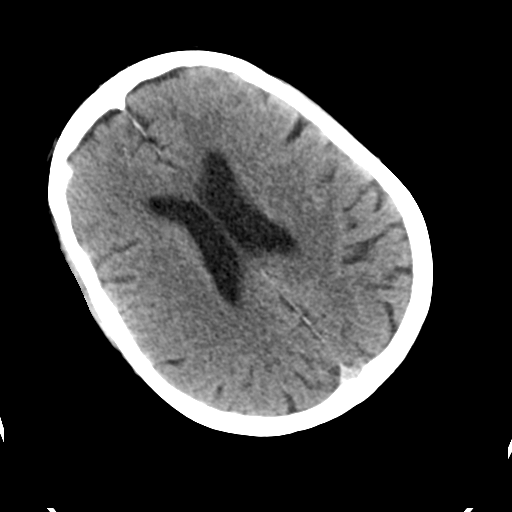
[im 23/37  bone]
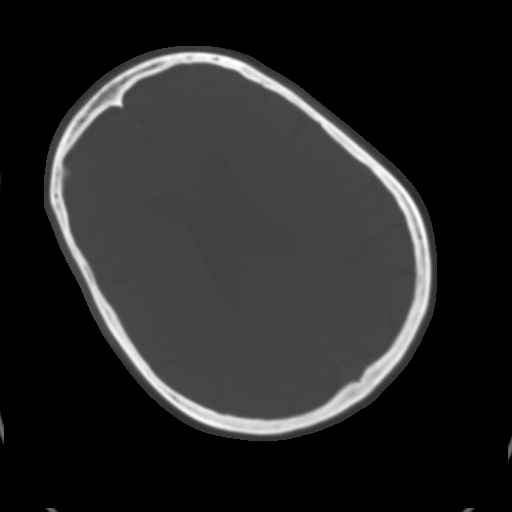
[im 28/37  brain]
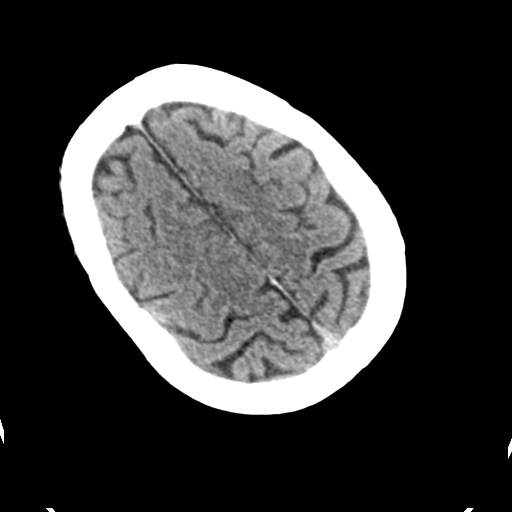
[im 32/37  brain]
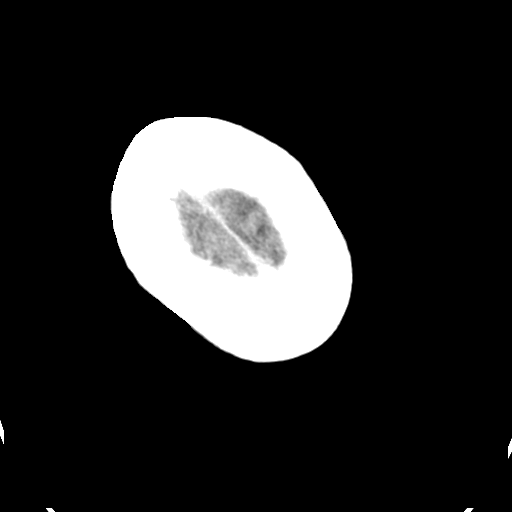

[Series 4: head bone · axial · 0.42mm/px · z∈[-178,-34]mm · 8 of 91 slices shown]
[im 10/91  bone]
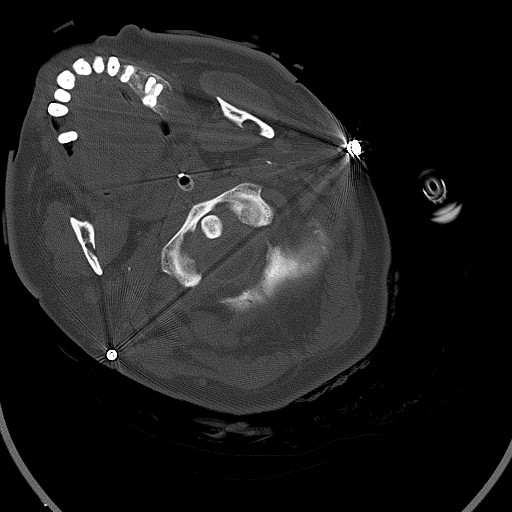
[im 19/91  bone]
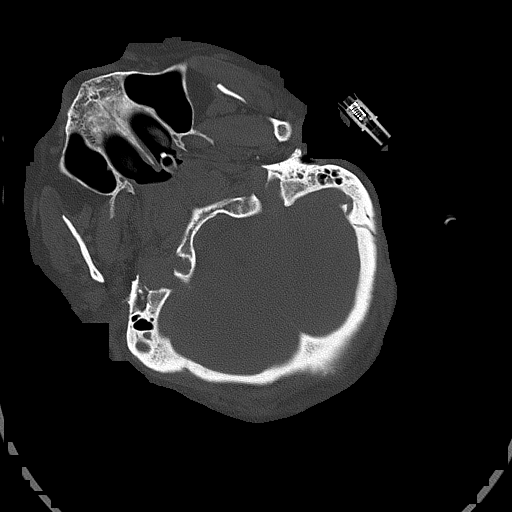
[im 28/91  bone]
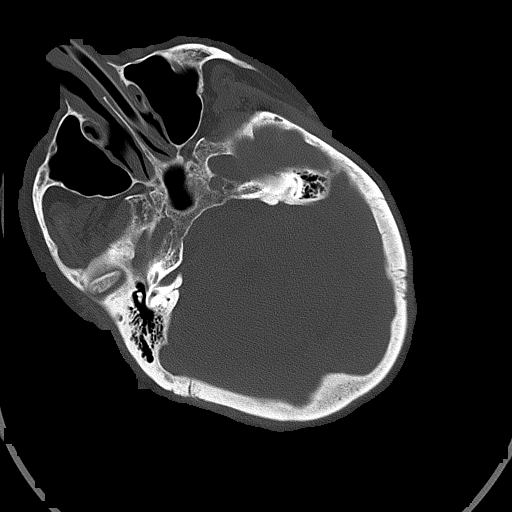
[im 41/91  bone]
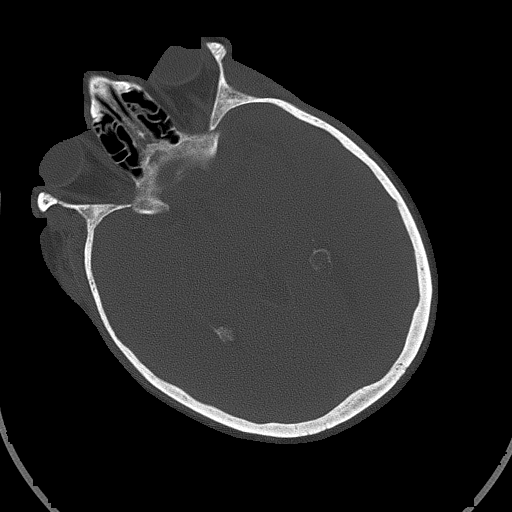
[im 50/91  bone]
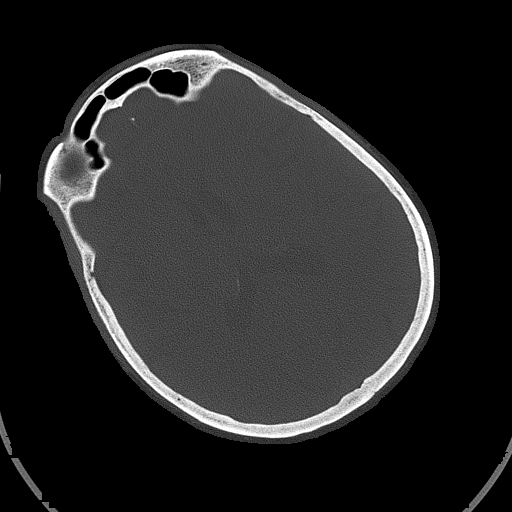
[im 64/91  bone]
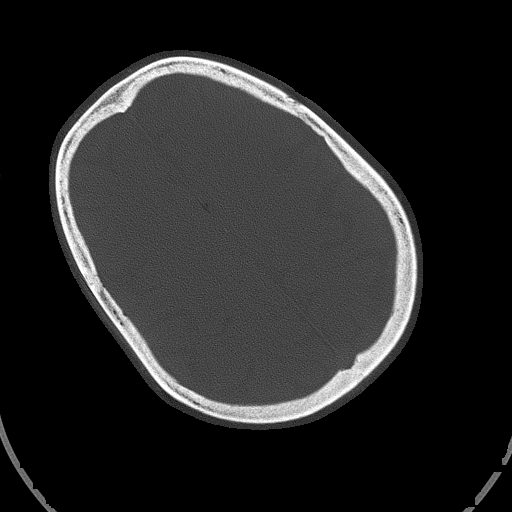
[im 73/91  bone]
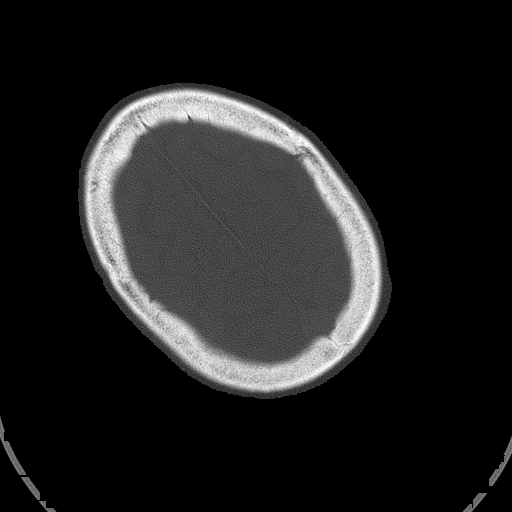
[im 82/91  bone]
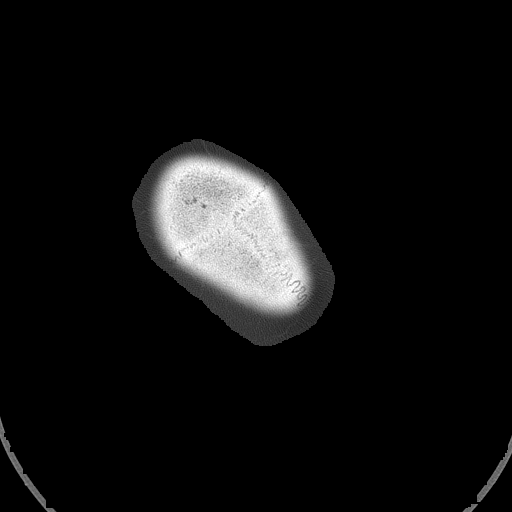

[15 of 30 positions shown; findings below may reference images not displayed]

FINDINGS: CT HEAD FINDINGS

Brain: No evidence of acute infarction, hemorrhage, hydrocephalus,
extra-axial collection or mass lesion/mass effect.

Vascular: No hyperdense vessel or unexpected calcification.

Skull: Normal. Negative for fracture or focal lesion.

Sinuses/Orbits: No acute finding.

Other: None.

CT CERVICAL SPINE FINDINGS

Alignment: Normal.

Skull base and vertebrae: No acute fracture. No primary bone lesion
or focal pathologic process.

Soft tissues and spinal canal: No prevertebral fluid or swelling. No
visible canal hematoma.

Disc levels: Mild disc space height loss and osteophytosis of the
lower cervical levels.

Upper chest: Please see separately dictated examination of the
chest.

Other: None.
IMPRESSION: 1. No acute intracranial pathology.
2. No fracture or static subluxation of the cervical spine.
3. Mild multilevel cervical disc degenerative disease.

## 2020-11-22 IMAGING — CT CT ANGIO CHEST
2 of 6 series · 18 of 36 positions shown · IV contrast (omnipaque)
Comparison: None.

CLINICAL DATA: Cardiac arrest, PE suspected

EXAM:
CT ANGIOGRAPHY CHEST WITH CONTRAST
TECHNIQUE: Multidetector CT imaging of the chest was performed using the
standard protocol during bolus administration of intravenous
contrast. Multiplanar CT image reconstructions and MIPs were
obtained to evaluate the vascular anatomy.
CONTRAST:  75mL OMNIPAQUE IOHEXOL 350 MG/ML SOLN

[Series 7: pe thins · axial · 0.69mm/px · z∈[-347,-90]mm · 17 of 409 slices shown]
[im 21/409  lung]
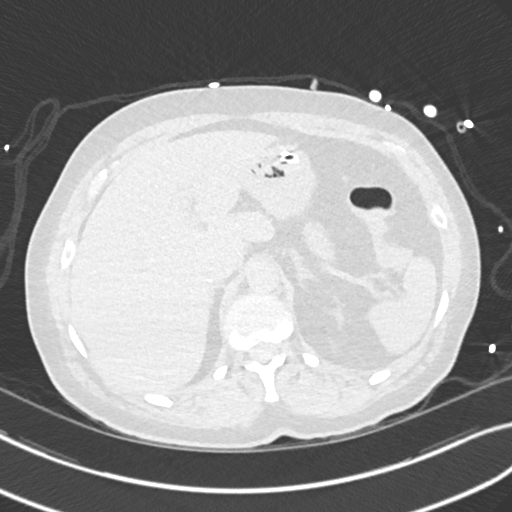
[im 41/409  mediastinal]
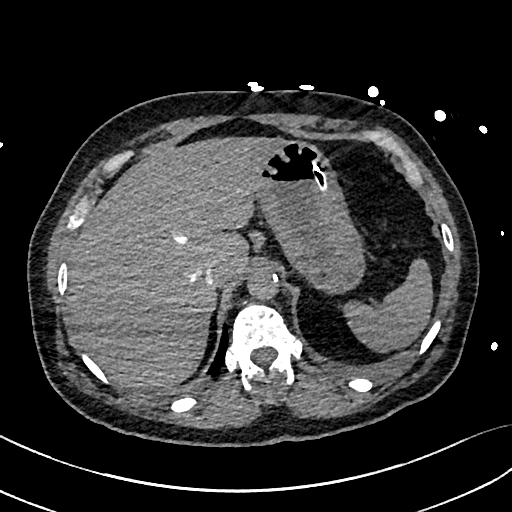
[im 62/409  lung]
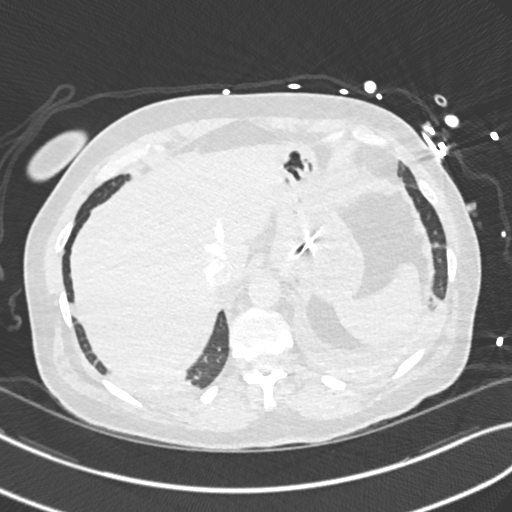
[im 82/409  mediastinal]
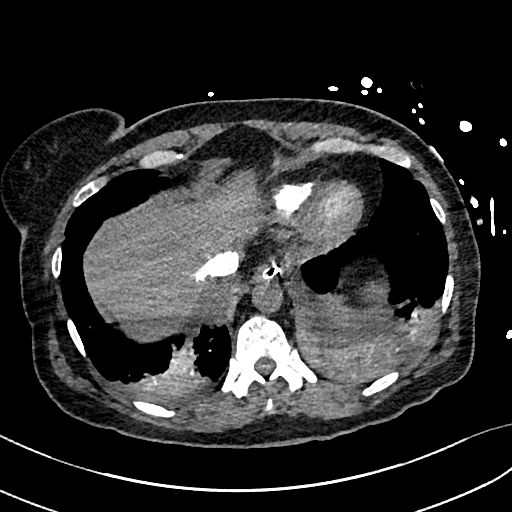
[im 123/409  lung]
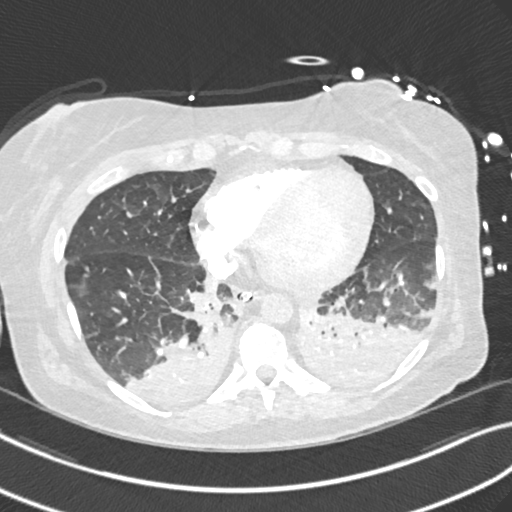
[im 143/409  mediastinal]
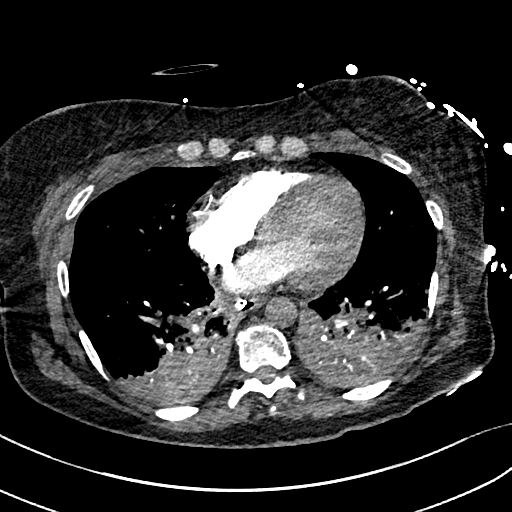
[im 164/409  lung]
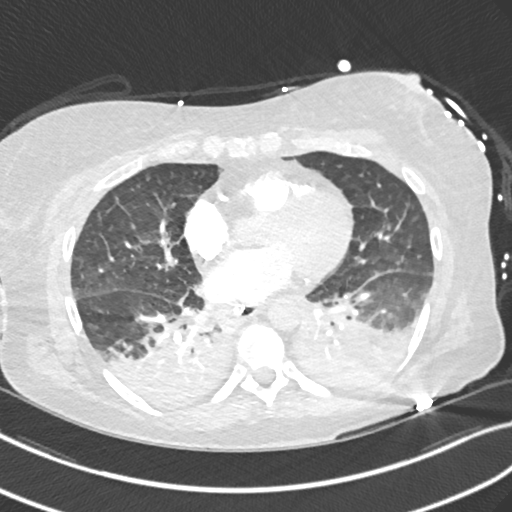
[im 184/409  mediastinal]
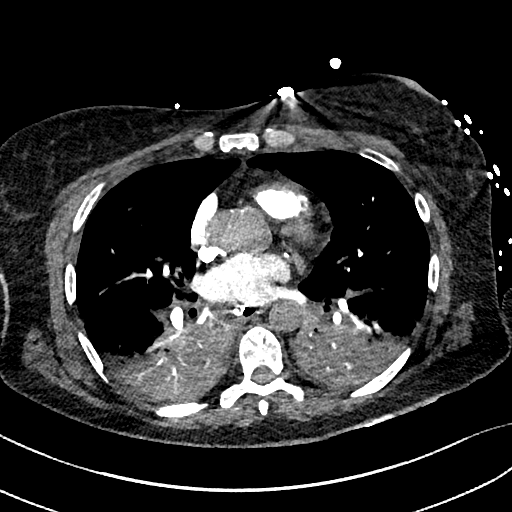
[im 205/409  lung]
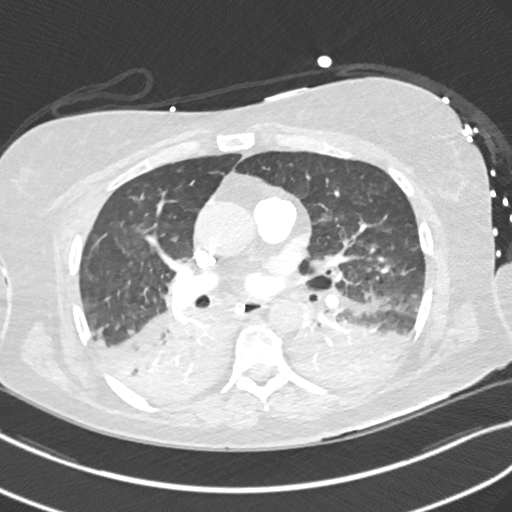
[im 225/409  mediastinal]
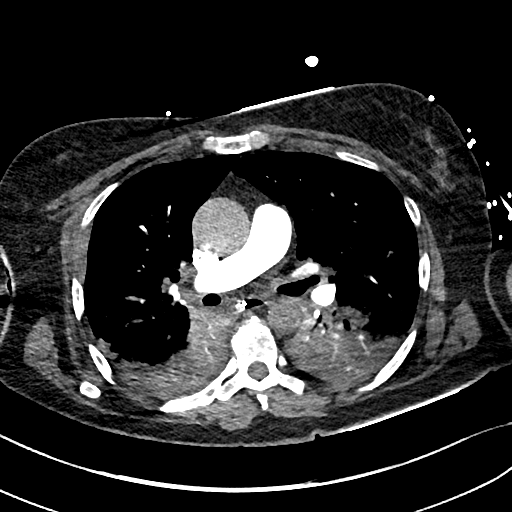
[im 245/409  lung]
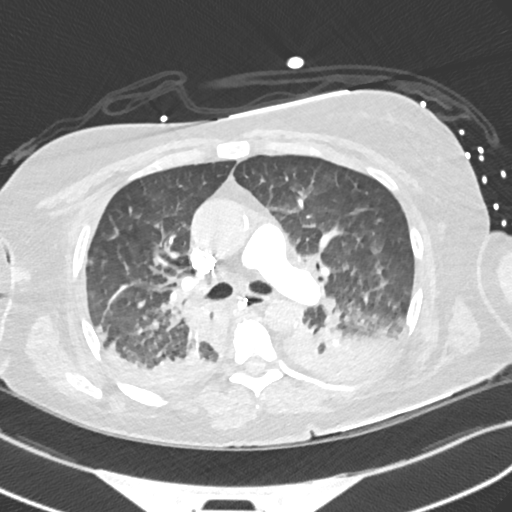
[im 266/409  mediastinal]
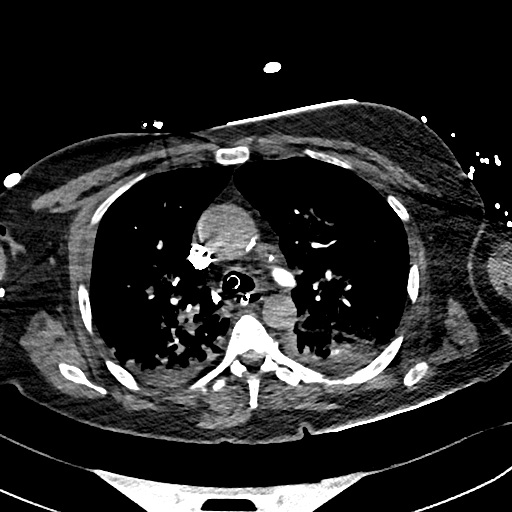
[im 286/409  lung]
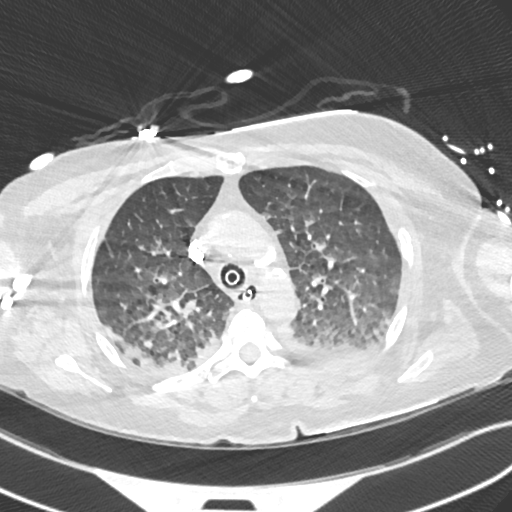
[im 327/409  mediastinal]
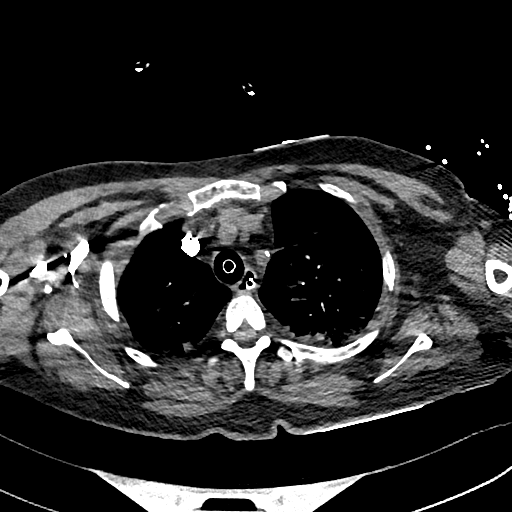
[im 347/409  lung]
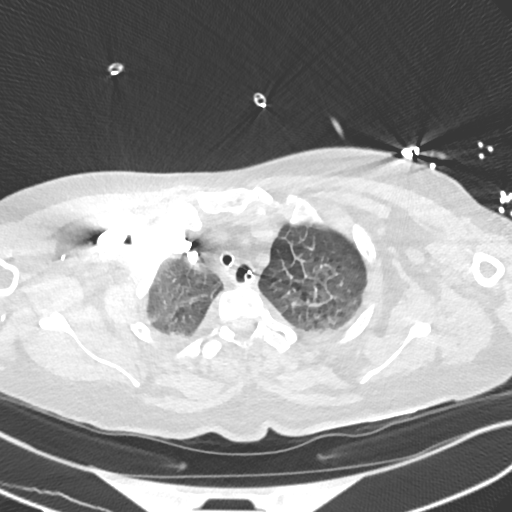
[im 368/409  mediastinal]
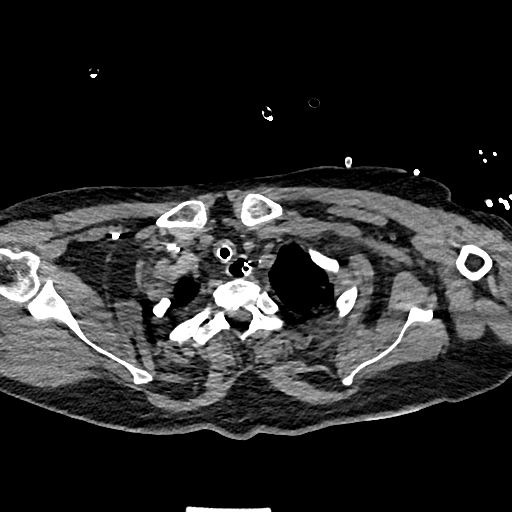
[im 388/409  lung]
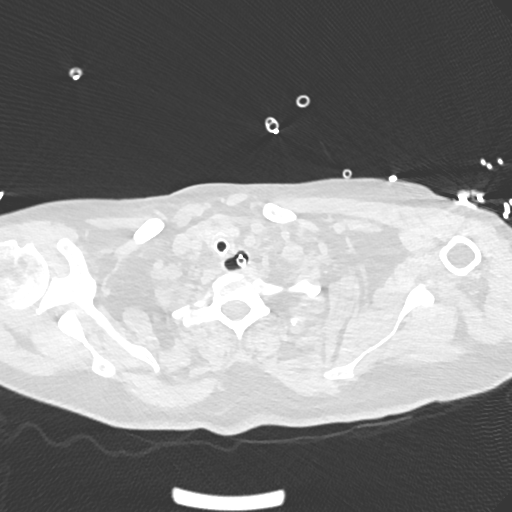

[Series 8: pe 2mm cor · coronal · 0.59mm/px · 1 of 151 slices shown]
[im 76/151  mediastinal]
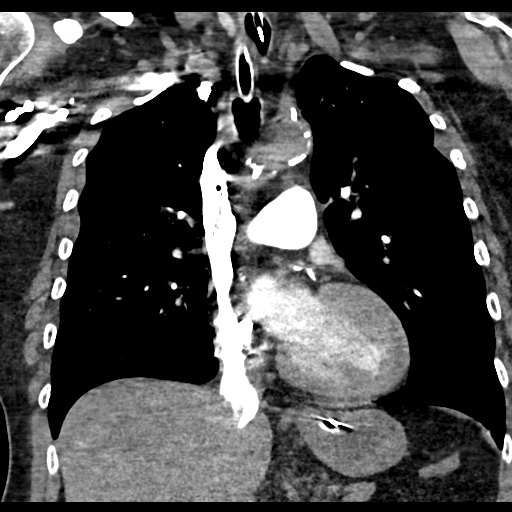

[18 of 36 positions shown; findings below may reference images not displayed]

FINDINGS: Cardiovascular: Satisfactory opacification of the pulmonary arteries
to the segmental level. No evidence of pulmonary embolism. Normal
heart size. Three-vessel coronary artery calcifications. No
pericardial effusion. Aortic atherosclerosis. Enlargement of the
tubular ascending thoracic aorta measuring up to 4.1 x 4.0 cm.

Mediastinum/Nodes: No enlarged mediastinal, hilar, or axillary lymph
nodes. Thyroid gland, trachea, and esophagus demonstrate no
significant findings.

Lungs/Pleura: Endotracheal intubation. Endotracheal tube tip is at
the ostium of the right mainstem bronchus (series 6, image 55).
Moderate bilateral pleural effusions and associated atelectasis or
consolidation. Diffuse bilateral interlobular septal thickening and
ground-glass airspace opacity. Mild underlying centrilobular
emphysema. Diffuse bilateral bronchial wall thickening.

Upper Abdomen: No acute abnormality.

Musculoskeletal: No chest wall abnormality. No acute or significant
osseous findings.

Review of the MIP images confirms the above findings.
IMPRESSION: 1.  Negative examination for pulmonary embolism.

2. Moderate bilateral pleural effusions and associated atelectasis
or consolidation. Diffuse bilateral interlobular septal thickening
and ground-glass airspace opacity. Diffuse bilateral bronchial wall
thickening. Constellation of findings is consistent with pulmonary
edema and or infection.

3. Endotracheal intubation. Endotracheal tube tip is at the ostium
of the right mainstem bronchus. Recommend slight retraction.

4.  Mild underlying centrilobular emphysema.

5.  Coronary artery disease.

6. Enlargement of the tubular ascending thoracic aorta, measuring up
to 4.1 x 4.0 cm. Recommend annual imaging followup by CTA or MRA.
This recommendation follows [VF]
ACCF/AHA/AATS/ACR/ASA/SCA/RENSKE/RENSKE/RENSKE/RENSKE Guidelines for the
Diagnosis and Management of Patients with Thoracic Aortic Disease.
Circulation. [VF]; 121: [id number]. Aortic aneurysm NOS ([VF]-[VF])

Aortic Atherosclerosis ([VF]-[VF]).

## 2020-11-22 MED ORDER — MIDAZOLAM HCL 2 MG/2ML IJ SOLN
2.0000 mg | INTRAMUSCULAR | Status: DC | PRN
  Administered 2020-11-23 – 2020-11-28 (×11): 2 mg via INTRAVENOUS
  Filled 2020-11-22 (×11): qty 2

## 2020-11-22 MED ORDER — IOHEXOL 350 MG/ML SOLN
75.0000 mL | Freq: Once | INTRAVENOUS | Status: AC | PRN
  Administered 2020-11-22: 75 mL via INTRAVENOUS

## 2020-11-22 MED ORDER — HEPARIN SODIUM (PORCINE) 1000 UNIT/ML IJ SOLN
1200.0000 [IU] | Freq: Once | INTRAMUSCULAR | Status: AC
  Administered 2020-11-23: 1200 [IU]
  Filled 2020-11-22: qty 2

## 2020-11-22 MED ORDER — POTASSIUM CHLORIDE 10 MEQ/100ML IV SOLN
10.0000 meq | INTRAVENOUS | Status: AC
  Filled 2020-11-22: qty 100

## 2020-11-22 MED ORDER — FENTANYL BOLUS VIA INFUSION
50.0000 ug | INTRAVENOUS | Status: DC | PRN
  Filled 2020-11-22: qty 100

## 2020-11-22 MED ORDER — INSULIN REGULAR(HUMAN) IN NACL 100-0.9 UT/100ML-% IV SOLN
INTRAVENOUS | Status: DC
  Administered 2020-11-22: 2.4 [IU]/h via INTRAVENOUS
  Filled 2020-11-22: qty 100

## 2020-11-22 MED ORDER — FENTANYL CITRATE PF 50 MCG/ML IJ SOSY: 50.0000 ug | PREFILLED_SYRINGE | Freq: Once | INTRAMUSCULAR | Status: DC

## 2020-11-22 MED ORDER — PHENYLEPHRINE 40 MCG/ML (10ML) SYRINGE FOR IV PUSH (FOR BLOOD PRESSURE SUPPORT)
PREFILLED_SYRINGE | INTRAVENOUS | Status: AC
  Filled 2020-11-22: qty 10

## 2020-11-22 MED ORDER — PHENYLEPHRINE HCL-NACL 20-0.9 MG/250ML-% IV SOLN
25.0000 ug/min | INTRAVENOUS | Status: DC
Start: 2020-11-22 — End: 2020-11-24
  Administered 2020-11-22: 25 ug/min via INTRAVENOUS
  Filled 2020-11-22: qty 250

## 2020-11-22 MED ORDER — DOCUSATE SODIUM 50 MG/5ML PO LIQD
100.0000 mg | Freq: Two times a day (BID) | ORAL | Status: DC
Start: 2020-11-22 — End: 2020-11-28
  Administered 2020-11-22 – 2020-11-28 (×11): 100 mg
  Filled 2020-11-22 (×11): qty 10

## 2020-11-22 MED ORDER — DEXTROSE 50 % IV SOLN: 0.0000 mL | INTRAVENOUS | Status: DC | PRN

## 2020-11-22 MED ORDER — CHLORHEXIDINE GLUCONATE CLOTH 2 % EX PADS
6.0000 | MEDICATED_PAD | Freq: Every day | CUTANEOUS | Status: DC
  Administered 2020-11-22 – 2020-11-28 (×7): 6 via TOPICAL

## 2020-11-22 MED ORDER — NOREPINEPHRINE 4 MG/250ML-% IV SOLN
2.0000 ug/min | INTRAVENOUS | Status: DC
  Administered 2020-11-22: 30 ug/min via INTRAVENOUS

## 2020-11-22 MED ORDER — SODIUM BICARBONATE 8.4 % IV SOLN
INTRAVENOUS | Status: AC
  Administered 2020-11-22: 100 meq via INTRAVENOUS
  Filled 2020-11-22: qty 100

## 2020-11-22 MED ORDER — VANCOMYCIN HCL 1250 MG/250ML IV SOLN
1250.0000 mg | Freq: Once | INTRAVENOUS | Status: AC
  Administered 2020-11-22: 1250 mg via INTRAVENOUS
  Filled 2020-11-22 (×2): qty 250

## 2020-11-22 MED ORDER — POLYETHYLENE GLYCOL 3350 17 G PO PACK
17.0000 g | PACK | Freq: Every day | ORAL | Status: DC
  Administered 2020-11-23 – 2020-11-28 (×5): 17 g
  Filled 2020-11-22 (×5): qty 1

## 2020-11-22 MED ORDER — PANTOPRAZOLE SODIUM 40 MG IV SOLR
40.0000 mg | INTRAVENOUS | Status: DC
  Administered 2020-11-22 – 2020-11-27 (×6): 40 mg via INTRAVENOUS
  Filled 2020-11-22 (×6): qty 40

## 2020-11-22 MED ORDER — PANTOPRAZOLE SODIUM 40 MG IV SOLR: 40.0000 mg | INTRAVENOUS | Status: DC

## 2020-11-22 MED ORDER — SODIUM CHLORIDE 0.9 % IV SOLN: INTRAVENOUS | Status: DC

## 2020-11-22 MED ORDER — NOREPINEPHRINE 4 MG/250ML-% IV SOLN
INTRAVENOUS | Status: AC
  Filled 2020-11-22: qty 250

## 2020-11-22 MED ORDER — FENTANYL 2500MCG IN NS 250ML (10MCG/ML) PREMIX INFUSION
50.0000 ug/h | INTRAVENOUS | Status: DC
  Administered 2020-11-22: 50 ug/h via INTRAVENOUS
  Filled 2020-11-22: qty 250

## 2020-11-22 MED ORDER — ATROPINE SULFATE 1 MG/10ML IJ SOSY
PREFILLED_SYRINGE | INTRAMUSCULAR | Status: AC
  Filled 2020-11-22: qty 10

## 2020-11-22 MED ORDER — DEXTROSE-NACL 5-0.45 % IV SOLN: INTRAVENOUS | Status: DC

## 2020-11-22 MED ORDER — VANCOMYCIN HCL IN DEXTROSE 1-5 GM/200ML-% IV SOLN: 1000.0000 mg | INTRAVENOUS | Status: DC

## 2020-11-22 MED ORDER — FENTANYL 2500MCG IN NS 250ML (10MCG/ML) PREMIX INFUSION
50.0000 ug/h | INTRAVENOUS | Status: DC
  Administered 2020-11-22: 150 ug/h via INTRAVENOUS
  Administered 2020-11-23: 200 ug/h via INTRAVENOUS
  Administered 2020-11-23: 125 ug/h via INTRAVENOUS
  Administered 2020-11-24 – 2020-11-25 (×3): 150 ug/h via INTRAVENOUS
  Administered 2020-11-26: 200 ug/h via INTRAVENOUS
  Administered 2020-11-26: 100 ug/h via INTRAVENOUS
  Administered 2020-11-26 – 2020-11-27 (×2): 200 ug/h via INTRAVENOUS
  Administered 2020-11-27: 125 ug/h via INTRAVENOUS
  Administered 2020-11-28 – 2020-11-29 (×3): 200 ug/h via INTRAVENOUS
  Filled 2020-11-22 (×14): qty 250

## 2020-11-22 MED ORDER — PIPERACILLIN-TAZOBACTAM 3.375 G IVPB
3.3750 g | Freq: Three times a day (TID) | INTRAVENOUS | Status: DC
  Administered 2020-11-22 – 2020-11-23 (×3): 3.375 g via INTRAVENOUS
  Filled 2020-11-22 (×2): qty 50

## 2020-11-22 MED ORDER — INSULIN ASPART 100 UNIT/ML IJ SOLN
0.0000 [IU] | INTRAMUSCULAR | Status: DC
Start: 2020-11-22 — End: 2020-11-22

## 2020-11-22 MED ORDER — SODIUM BICARBONATE 8.4 % IV SOLN
50.0000 meq | Freq: Once | INTRAVENOUS | Status: AC
  Administered 2020-11-22: 50 meq via INTRAVENOUS

## 2020-11-22 MED ORDER — ACETAMINOPHEN 160 MG/5ML PO SOLN
650.0000 mg | Freq: Four times a day (QID) | ORAL | Status: DC | PRN
  Administered 2020-11-23 – 2020-11-28 (×7): 650 mg
  Filled 2020-11-22 (×7): qty 20.3

## 2020-11-22 MED ORDER — SODIUM BICARBONATE 8.4 % IV SOLN: 100.0000 meq | Freq: Once | INTRAVENOUS | Status: AC

## 2020-11-22 MED ORDER — NOREPINEPHRINE 4 MG/250ML-% IV SOLN
0.0000 ug/min | INTRAVENOUS | Status: DC
Start: 2020-11-23 — End: 2020-11-27
  Administered 2020-11-23: 15 ug/min via INTRAVENOUS
  Administered 2020-11-23: 13 ug/min via INTRAVENOUS
  Administered 2020-11-23: 24 ug/min via INTRAVENOUS
  Administered 2020-11-23: 25 ug/min via INTRAVENOUS
  Administered 2020-11-23: 22 ug/min via INTRAVENOUS
  Administered 2020-11-23: 28 ug/min via INTRAVENOUS
  Administered 2020-11-23: 25 ug/min via INTRAVENOUS
  Administered 2020-11-23: 17 ug/min via INTRAVENOUS
  Administered 2020-11-24: 8 ug/min via INTRAVENOUS
  Administered 2020-11-24: 6 ug/min via INTRAVENOUS
  Filled 2020-11-22 (×4): qty 250
  Filled 2020-11-22: qty 500
  Filled 2020-11-22 (×2): qty 250
  Filled 2020-11-22: qty 500

## 2020-11-22 MED ORDER — SODIUM CHLORIDE 0.9 % IV SOLN: 250.0000 mL | INTRAVENOUS | Status: DC

## 2020-11-22 MED ORDER — FENTANYL BOLUS VIA INFUSION
50.0000 ug | INTRAVENOUS | Status: DC | PRN
  Administered 2020-11-24: 100 ug via INTRAVENOUS
  Administered 2020-11-29: 50 ug via INTRAVENOUS
  Filled 2020-11-22: qty 100

## 2020-11-22 NOTE — Consult Note (Signed)
Cardiology Consultation:   Patient ID: Samantha Cooley MRN: [id number]; DOB: 06-13-1960  Admit date: 10/31/2020 Date of Consult: 11/09/2020  PCP:  Richardean Chimera, MD   [facility] Providers Cardiologist:  None        Patient Profile:   Samantha Cooley is a 60 y.o. female with a hx of HTN who is being seen 10/26/2020 for the evaluation of cardiac arrest at the request of Dr. Wilkie Aye.  History of Present Illness:   History was obtained from EMS since the patient was intubated.  Samantha Cooley was reportedly in her usual state of health.  She talked to her boyfriend at about 2:00.  She apparently did not report that she had been feeling poorly.  He went to her house at about 2:50 PM.  He found her unresponsive on the floor.  He called EMS.  Apparently, her boyfriend did do CPR.  EMS began CPR at about 2:55.  After several rounds of epinephrine, she had ROSC at 3:07 PM.  She was brought to the ER.  An ECG was transmitted showing a wide-complex QRS.  There were diffuse ST segment depressions, more pronounced in the inferior leads.  ECG done in the emergency room showed left bundle branch block.  It was reported that the patient was biting the airway.  She had not been given sedation but was not showing any purposeful movements.  It was unclear exactly the duration of her downtime given the gap between when her boyfriend spoke to her and when he saw her.   Past Medical History:  Diagnosis Date   Diabetes (HCC)    HTN (hypertension)     Past Surgical History:  Procedure Laterality Date   ABDOMINAL HYSTERECTOMY     CESAREAN SECTION         Inpatient Medications: Scheduled Meds:  fentaNYL (SUBLIMAZE) injection  50 mcg Intravenous Once   Continuous Infusions:  fentaNYL infusion INTRAVENOUS 125 mcg/hr (11/07/2020 1626)   PRN Meds: fentaNYL  Allergies:   Not on File  Social History:   Social History   Socioeconomic History   Marital status: Divorced    Spouse name: Not on file    Number of children: Not on file   Years of education: Not on file   Highest education level: Not on file  Occupational History   Not on file  Tobacco Use   Smoking status: Every Day    Types: E-cigarettes, Cigarettes   Smokeless tobacco: Former  Substance and Sexual Activity   Alcohol use: Yes   Drug use: Yes    Types: Marijuana    Comment: once in a while   Sexual activity: Yes  Other Topics Concern   Not on file  Social History Narrative   Not on file   Social Determinants of Health   Financial Resource Strain: Not on file  Food Insecurity: Not on file  Transportation Needs: Not on file  Physical Activity: Not on file  Stress: Not on file  Social Connections: Not on file  Intimate Partner Violence: Not on file    Family History:    Family History  Problem Relation Age of Onset   Cancer Mother      ROS:  Please see the history of present illness.  Unable to obtain as the patient is intubated All other ROS reviewed and negative.     Physical Exam/Data:   Vitals:   11/16/2020 1621 11/10/2020 1624 10/25/2020 1627 11/09/2020 1629  BP: (!) 172/109 (!) 169/102 Marland Kitchen)  170/97   Pulse: (!) 117 (!) 114 (!) 110   Resp: (!) 26 (!) 25 (!) 35   SpO2: 97% 96% 96% 95%  Height:        Intake/Output Summary (Last 24 hours) at December 15, 2020 1645 Last data filed at 12-15-20 1626 Gross per 24 hour  Intake 1001.48 ml  Output --  Net 1001.48 ml   Last 3 Weights 04/03/2019  Weight (lbs) 152 lb 12.8 oz  Weight (kg) 69.31 kg     Body mass index is 26.23 kg/m.  General:  Well nourished, well developed, unresponsive, intubated HEENT: normal Neck: no JVD Vascular: No carotid bruits; Distal pulses 2+ bilaterally Cardiac:  normal S1, S2; tachycardic; no murmur ; distant heart sounds Lungs:  clear to auscultation bilaterally, no wheezing, rhonchi or rales  Abd: soft, nontender, no hepatomegaly, mildly obese Ext: no edema Musculoskeletal:  No deformities, BUE and BLE strength normal and  equal Skin: warm and dry  Neuro:  CNs 2-12 intact, no focal abnormalities noted Psych:  Normal affect   EKG:  The EKG was personally reviewed and demonstrates: Left bundle branch block Telemetry:  Telemetry was personally reviewed and demonstrates: Tachycardia  Relevant CV Studies: Bedside ultrasound of the heart showed no significant pericardial effusion.  RV function appeared normal, LV function was difficult to determine in the subcostal view.  Other views were difficult visualization.  Laboratory Data:  High Sensitivity Troponin:  No results for input(s): TROPONINIHS in the last 720 hours.   Chemistry Recent Labs  Lab Dec 15, 2020 1609 12-15-20 1614  NA 130* 129*  K 3.5 3.6  CL  --  97*  GLUCOSE  --  409*  BUN  --  21  CREATININE  --  1.40*    No results for input(s): PROT, ALBUMIN, AST, ALT, ALKPHOS, BILITOT in the last 168 hours. Lipids No results for input(s): CHOL, TRIG, HDL, LABVLDL, LDLCALC, CHOLHDL in the last 168 hours.  Hematology Recent Labs  Lab 2020/12/15 1609 12/15/2020 1610 12-15-20 1614  WBC  --  18.3*  --   RBC  --  4.59  --   HGB 17.0* 15.3* 17.0*  HCT 50.0* 48.6* 50.0*  MCV  --  105.9*  --   MCH  --  33.3  --   MCHC  --  31.5  --   RDW  --  12.9  --   PLT  --  149*  --    Thyroid No results for input(s): TSH, FREET4 in the last 168 hours.  BNPNo results for input(s): BNP, PROBNP in the last 168 hours.  DDimer No results for input(s): DDIMER in the last 168 hours.   Radiology/Studies:  DG Chest Portable 1 View  Result Date: 2020-12-15 CLINICAL DATA:  Cardiac arrest, ETT placement EXAM: PORTABLE CHEST 1 VIEW COMPARISON:  None. FINDINGS: Cardiomegaly. Diffuse bilateral interstitial pulmonary opacity. Endotracheal tube is positioned with tip at the carina. Esophagogastric tube with tip and side port below the diaphragm. IMPRESSION: 1. Endotracheal tube is positioned with tip at the carina. Recommend retraction. 2. Esophagogastric tube with tip and side  port below the diaphragm. 3. Diffuse bilateral interstitial pulmonary opacity, likely edema in the setting of cardiomegaly. Electronically Signed   By: Jearld Lesch M.D.   On: 15-Dec-2020 16:28     Assessment and Plan:   Cardiac arrest: Called to see if she would be appropriate for the Cath Lab.  Given that she does not have clear ST elevation on her ECG, I would not  bring her straight to the Cath Lab.  She also did not have VT or VF.  She did not require defibrillation.  She only required epinephrine for asystole.  ECG may be indicative of some type of metabolic derangement or is abnormal just due to the post arrest circumstance.  Continue work-up in the ER.  Initially wanted to see lactate.  pH has come back quite low suggestive of more prolonged hypoperfusion.  Lactic acid level pending.  Further cardiac work-up will depend on her neurologic recovery.   Risk Assessment/Risk Scores:       CRITICAL CARE Performed by: Lance Muss   Total critical care time: 45 minutes  Critical care time was exclusive of separately billable procedures and treating other patients.  Critical care was necessary to treat or prevent imminent or life-threatening deterioration.  Critical care was time spent personally by me on the following activities: development of treatment plan with patient and/or surrogate as well as nursing, discussions with consultants, evaluation of patient's response to treatment, examination of patient, obtaining history from patient or surrogate, ordering and performing treatments and interventions, ordering and review of laboratory studies, ordering and review of radiographic studies, pulse oximetry and re-evaluation of patient's condition.       For questions or updates, please contact [facility] Please consult [email or web address] for contact info under    Signed, Lance Muss, MD  11/17/2020 4:45 PM

## 2020-11-22 NOTE — ED Notes (Signed)
Pt transported to CT and back w/o incident

## 2020-11-22 NOTE — H&P (Signed)
NAME:  Samantha Cooley, MRN:  [id number], DOB:  August 17, 1960, LOS: 0 ADMISSION DATE:  11/06/2020, CONSULTATION DATE:  9/30  REFERRING MD:  Horton, CHIEF COMPLAINT:  cardiac arrest    History of Present Illness:  60 year old white female with history as per below.  Presented to the emergency room on 9/30 status post PEA arrest.  History obtained in the emergency room: Was in normal state of health, had been on phone with her significant other around 2 PM, later at 2:50 PM found unresponsive and pulseless by significant other.  EMS was called, and significant other initiated CPR.  EMS arrival noted to be around 2:55 PM.  Initial rhythm PEA.  Return of spontaneous circulation estimated at approximately 15 minutes.  On arrival to the emergency room intubated, requiring high FiO2, initially hypertensive, improved with fentanyl.  Would open eyes, not appropriately, stick her tongue out, had equal muscle tone bilaterally and would lift arms spontaneously but could not follow commands with lower extremities. Preliminary evaluation in the ER: Glucose 372 White blood cell count 18.3 creatinine 1.75 anion gap metabolic acidosis with serum bicarbonate of 11, lactate of greater than 9 peak troponin of 588.   -Bedside ultrasound was negative for pericardial effusion RV function normal, LV function difficult to assess.  Seen by cardiology, did not think this was consistent with STEMI Pertinent  Medical History  HTN, diabetes, anxiety, depression, insomnia.  Significant Hospital Events: Including procedures, antibiotic start and stop dates in addition to other pertinent events   9/30 admitted s/p arrest, intubated in field, seen by cardiology and felt not primary ST elevation event.  Suspected more metabolically driven.  On presentation had severe anion gap metabolic acidosis, hyperglycemia, and encephalopathy but was following some simple commands such as sticking tongue out and nodding appropriately. Vanc/zosyn started.  CT head neck and chest ordered.   Interim History / Subjective:  Now sedated   Objective   Blood pressure (Abnormal) 142/93, pulse 93, temperature (Abnormal) 95.9 F (35.5 C), temperature source Rectal, resp. rate (Abnormal) 21, height 5\' 4"  (1.626 m), weight 69.3 kg, SpO2 92 %.    Vent Mode: PRVC FiO2 (%):  [100 %] 100 % Set Rate:  [22 bmp-24 bmp] 24 bmp Vt Set:  [440 mL] 440 mL PEEP:  [5 cmH20-8 cmH20] 8 cmH20 Plateau Pressure:  [24 cmH20] 24 cmH20   Intake/Output Summary (Last 24 hours) at 10/24/2020 1722 Last data filed at 11/11/2020 1700 Gross per 24 hour  Intake 1010.66 ml  Output no documentation  Net 1010.66 ml   Filed Weights   11/21/2020 1645  Weight: 69.3 kg    Examination: General: Chronically ill-appearing 60 year old white female currently sedated on fentanyl infusion HENT: Normocephalic atraumatic orally intubated mucous membranes are moist pupils reactive Lungs: Diffuse scattered rhonchi left greater than right portable chest x-ray personally reviewed demonstrating bilateral left greater than right airspace disease however endotracheal tube does need to be pulled back Cardiovascular: Regular rate and rhythm without murmur rub or gallop Abdomen: Soft not tender Extremities: Cool slightly mottled she has an IO in her left lower extremity pulses however are palpable and capillary refill is brisk Neuro: Opens eyes to verbal request, nods to verbal but request, can stick her tongue out.  Nonspecific with upper extremities, has rigid/tremulous upper extremity movement not quite following commands but seemingly somewhat purposeful GU: Due to void  Resolved Hospital Problem list     Assessment & Plan:  Status post PEA arrest (reason for admit)  Etiology  unclear.  Seen by cardiology, felt potentially arrest secondary to metabolic derangements and not primary cardiac event -Etiology of arrest not entirely clear.  She did have significant derangements acidosis, so in the  context of diabetes perhaps DKA and resultant rhythm dysfunction, also consider intentional versus unintentional overdose, also consider possibility of seizure and post ictal hypoxia leading to cardiac arrest Plan Admit to intensive care Obtain CT brain, C-spine, and chest, specifically C-spine given being found down in to rule out cervical injury Echocardiogram Cycle cardiac enzymes Telemetry Correct her metabolic derangements Sending UDS, acetaminophen and aspirin levels IV hydration  Acute hypoxic respiratory failure secondary to aspiration pneumonia, following PEA arrest (POA)and CPR Portable chest x-ray personally reviewed demonstrating left greater than right airspace disease however endotracheal tube is at tip of right carina  Plan Retract endotracheal tube Full ventilator support VAP bundle PAD protocol Sputum culture Empiric vancomycin and Zosyn started A.m. chest x-ray Follow-up ABG  Acute metabolic plus/minus toxic encephalopathy.  Etiology unclear.  Did suffer a cardiac arrest with downtime of approximately 15 minutes before ROSC, so certainly anoxic injury on differential.  Also consider acidosis, postictal state, or polysubstance overdoses potential contributing factors Plan Continue hydration Repeating lactic acid and checking beta hydroxybutyric acid UDS and drug screens as noted above PAD protocol CT head Consider EEG  Anion gap metabolic acidosis.  Suspect this is multifactorial she does have significant lactic acidosis, but also is hyper glycemic.  Lactic acid likely secondary to cardiac arrest and CPR and prolonged hypoperfusion state.  Also consider DKA, wonder if acidosis was the driving factor of her arrest, however review of her more recent hemoglobin A1c suggests she is typically fairly well controlled as of the summer Plan Checking beta hydroxybutyric acid Checking urinalysis Repeating lactic acid IV hydration and volume resuscitation Serial  chemistries  AKI.  Suspect secondary to volume depletion and organ hypoperfusion Plan IV hydration Holding antihypertensives:She is typically on metoprolol, hydrochlorothiazide, and spironolactone. Place Foley catheter Serial chemistries  Fluid and electrolyte imbalance: Pseudohyponatremia in the context of hyperglycemia Plan Continuing normal saline Serial chemistries Correct hyperglycemia  History of diabetes with now severe hyperglycemia with metabolic acidosis. Need to rule out DKA Plan Initiate DKA protocol Hourly blood glucose IV insulin IV resuscitation Close observation of acid-base  Leukocytosis.  Suspect mix of reactive plus minus aspiration Antibiotics started Plan Trend CBC  Elevated LFTs.  Suspect hypoperfusion following CPR Plan A.m. LFTs Best Practice (right click and "Reselect all SmartList Selections" daily)   Diet/type: NPO DVT prophylaxis: SCD GI prophylaxis: PPI Lines: N/A Foley:  Yes, and it is still needed Code Status:  full code Last date of multidisciplinary goals of care discussion [pending]  Labs   CBC: Recent Labs  Lab 11/07/2020 1609 11/05/2020 1610 10/27/2020 1614 10/27/2020 1710  WBC  --  18.3*  --   --   NEUTROABS  --  11.4*  --   --   HGB 17.0* 15.3* 17.0* 16.7*  HCT 50.0* 48.6* 50.0* 49.0*  MCV  --  105.9*  --   --   PLT  --  149*  --   --     Basic Metabolic Panel: Recent Labs  Lab 11/06/2020 1609 11/07/2020 1610 11/07/2020 1614 10/25/2020 1710  NA 130* 131* 129* 132*  K 3.5 3.6 3.6 3.5  CL  --  95* 97*  --   CO2  --  11*  --   --   GLUCOSE  --  422* 409*  --  BUN  --  17 21  --   CREATININE  --  1.75* 1.40*  --   CALCIUM  --  8.2*  --   --   MG  --  2.3  --   --    GFR: Estimated Creatinine Clearance: 39.8 mL/min (A) (by C-G formula based on SCr of 1.4 mg/dL (H)). Recent Labs  Lab 10/31/2020 1610  WBC 18.3*  LATICACIDVEN >9.0*    Liver Function Tests: Recent Labs  Lab 10/28/2020 1610  AST 106*  ALT 61*  ALKPHOS  148*  BILITOT 0.8  PROT 5.3*  ALBUMIN 2.9*   No results for input(s): LIPASE, AMYLASE in the last 168 hours. No results for input(s): AMMONIA in the last 168 hours.  ABG    Component Value Date/Time   PHART 7.142 (LL) 11/21/2020 1710   PCO2ART 50.2 (H) 10/31/2020 1710   PO2ART 69 (L) 11/08/2020 1710   HCO3 17.6 (L) 11/08/2020 1710   TCO2 19 (L) 11/10/2020 1710   ACIDBASEDEF 12.0 (H) 11/05/2020 1710   O2SAT 89.0 11/12/2020 1710     Coagulation Profile: No results for input(s): INR, PROTIME in the last 168 hours.  Cardiac Enzymes: No results for input(s): CKTOTAL, CKMB, CKMBINDEX, TROPONINI in the last 168 hours.  HbA1C: No results found for: HGBA1C  CBG: Recent Labs  Lab 11/19/2020 1609  GLUCAP 372*    Review of Systems:   Unable  Past Medical History:  She,  has a past medical history of Diabetes (HCC) and HTN (hypertension).   Surgical History:   Past Surgical History:  Procedure Laterality Date   ABDOMINAL HYSTERECTOMY     CESAREAN SECTION       Social History:   reports that she has been smoking e-cigarettes and cigarettes. She has quit using smokeless tobacco. She reports current alcohol use. She reports current drug use. Drug: Marijuana.   Family History:  Her family history includes Cancer in her mother.   Allergies Allergies  Allergen Reactions   Aspirin    Aspirin Buffered Other (See Comments)    unspecified   Sertraline Other (See Comments)    Face swelled up.   Sumatriptan Other (See Comments)    In a fog.  Like a zombie.     Home Medications  Prior to Admission medications   Medication Sig Start Date End Date Taking? Authorizing Provider  ALPRAZolam Prudy Feeler) 1 MG tablet Take 1 mg by mouth 3 (three) times daily as needed. 11/21/18   [provider]  escitalopram (LEXAPRO) 20 MG tablet Take 20 mg by mouth daily. 11/10/18   [provider]  HYDROcodone-acetaminophen (NORCO) 10-325 MG tablet Take 1/2 to 1 tablet three times  a day as needed for pain 11/19/18   [provider]  metFORMIN (GLUCOPHAGE-XR) 500 MG 24 hr tablet Take 1 tablet twice a day, at breakfast and supper, for control of diabetes. 11/10/18   [provider]  metoprolol succinate (TOPROL-XL) 50 MG 24 hr tablet TAKE 1 TABLET BY MOUTH ONCE DAILY FOR BLOOD PRESSURE AND TO PREVENT PALPITATIONS 11/10/18   [provider]  omeprazole (PRILOSEC) 20 MG capsule TAKE ONE CAPSULE BY MOUTH TWICE DAILY TO CONTROL ACID REFLUX 04/14/18   [provider]  spironolactone-hydrochlorothiazide (ALDACTAZIDE) 25-25 MG tablet TAKE ONE TABLET BY MOUTH ONCE DAILY 11/10/18   [provider]  SUMAtriptan (IMITREX) 50 MG tablet Take one tablet at the earliest sign of a migraine 11/10/18   [provider]  zolpidem (AMBIEN) 10 MG  tablet TAKE 1/2 TO 1 (ONE-HALF TO ONE) TABLET BY MOUTH AT BEDTIME AS NEEDED FOR INSOMNIA 11/19/18   [provider]     Critical care time: 40 min    Simonne Martinet ACNP-BC [facility] Pulmonary/Critical Care Pager # [phone] OR # [phone] if no answer

## 2020-11-22 NOTE — Progress Notes (Signed)
Pharmacy Antibiotic Note  Samantha Cooley is a 60 y.o. female admitted on 12/14/20 presenting post-CPR, concern for sepsis also aspiration.  Pharmacy has been consulted for vancomycin and zosyn dosing.  Plan: Vancomycin 1250 mg IV x 1, then 1000 mg IV q 36h (eAUC 472, Goal AUC 400-550, SCr 1.75 on CMET) Zosyn 3.375g IV q 8h (extended infusion) Monitor renal function, Cx and clinical progression to narrow Vancomycin levels as needed  Height: 5\' 4"  (162.6 cm) Weight: 69.3 kg (152 lb 12.5 oz) IBW/kg (Calculated) : 54.7  Temp (24hrs), Avg:90.9 F (32.7 C), Min:85.8 F (29.9 C), Max:95.9 F (35.5 C)  Recent Labs  Lab 12/14/2020 1610 12/14/20 1614  WBC 18.3*  --   CREATININE 1.75* 1.40*  LATICACIDVEN >9.0*  --     Estimated Creatinine Clearance: 39.8 mL/min (A) (by C-G formula based on SCr of 1.4 mg/dL (H)).    Allergies  Allergen Reactions   Aspirin    Aspirin Buffered Other (See Comments)    unspecified   Sertraline Other (See Comments)    Face swelled up.   Sumatriptan Other (See Comments)    In a fog.  Like a zombie.    11/24/20, PharmD Clinical Pharmacist ED Pharmacist Phone # [phone] 12/14/2020 6:02 PM

## 2020-11-22 NOTE — ED Notes (Signed)
Son Christiane Ha [phone] would like an update

## 2020-11-22 NOTE — Progress Notes (Signed)
eLink Physician-Brief Progress Note Patient Name: SINDHU NGUYEN DOB: May 07, 1960 MRN: [id number]   Date of Service  11/01/2020  HPI/Events of Note  Hypotensive again - BP = 67/20 on Phenylephrine IV infusion via PIV at 120 mcg/min.  eICU Interventions  Plan: Norepinephrine IV infusion via PIV. Titrate to MAP >= 65. NaHCO3 100 meq IV now. ABG STAT, Will notify PCCM ground team of need for CVL and bedside evaluation.     Intervention Category Major Interventions: Hypotension - evaluation and management  Denver Harder Eugene 11/17/2020, 10:22 PM

## 2020-11-22 NOTE — Progress Notes (Signed)
Patient transported to CT and back without complications.  

## 2020-11-22 NOTE — ED Notes (Signed)
RT at bedside.

## 2020-11-22 NOTE — Procedures (Addendum)
Arterial Catheter Insertion Procedure Note  Samantha Cooley  [id number]  December 29, 1960  Date:December 08, 2020  Time:11:35 PM   I was present and supervised the entire procedure - Karrie Doffing MD  Provider Performing: Eliezer Champagne    Procedure: Insertion of Arterial Line (35456) with US guidance (25638)   Indication(s) Blood pressure monitoring and/or need for frequent ABGs  Consent Unable to obtain consent due to emergent nature of procedure.  Anesthesia None   Time Out Verified patient identification, verified procedure, site/side was marked, verified correct patient position, special equipment/implants available, medications/allergies/relevant history reviewed, required imaging and test results available.   Sterile Technique Maximal sterile technique including full sterile barrier drape, hand hygiene, sterile gown, sterile gloves, mask, hair covering, sterile ultrasound probe cover (if used).   Procedure Description Area of catheter insertion was cleaned with chlorhexidine and draped in sterile fashion. With real-time ultrasound guidance an arterial catheter was placed into the right radial artery.  Appropriate arterial tracings confirmed on monitor.     Complications/Tolerance None; patient tolerated the procedure well.   EBL Minimal   Specimen(s) None  Samantha Mussel., MSN, APRN, AGACNP-BC [facility]  12/08/20 , 11:36 PM  Please see [email or web address] for pager details  If no response, please call [phone] After hours, please call Elink at [phone]

## 2020-11-22 NOTE — Progress Notes (Signed)
Patient transported to [facility] without complications.

## 2020-11-22 NOTE — ED Triage Notes (Signed)
Pt arrived to ED via EMS from home as a post cardiac arrest. Pt was LKN at 1400 per pt's boyfriend. Pt's boyfriend found pt unresponsive on floor at 1413 w/ agonal respirations and cyanosis. PD started CPR at 1452 and EMS arrived and took over CPR at 1455. ROSC was obtained at 1507. EMS reports initial rhythm asystole, then PEA, then Sinus and last junctional. EMS gave 3 rounds of epi. EMS intubated and placed 18g R AC, 20g L AC and L tibial IO. 1L NS bolus given. BP 109/83, ETCO2 26. HR 106, 93% w/ BVM. EMS transmitted post ROSC EKG d/t concerns for STEMI.

## 2020-11-22 NOTE — ED Provider Notes (Signed)
[facility] EMERGENCY DEPARTMENT Provider Note   CSN: [id number] Arrival date & time: 10/30/2020  1600     History Chief Complaint  Patient presents with   Cardiac Arrest    Samantha Cooley is a 60 y.o. female.  HPI This is a 60 year old female with history of diabetes and HTN who presents with EMS post cardiac arrest. Patient was reportedly found down at 2:50 pm by partner who reports she was unresponsive and without pulse so chest compressions were started and EMS was called. EMS arrived and found patient unresponsive and without pulse or respirations so CPR was done for approximately 10-15 minutes and then ROSC was obtained.Marland Kitchen Epi was given x3. Rhythm was reportedly PEA arrest at all points until ROSC. Patient was intubated in route. EKG en route with some wide QRS complexes and ST depressions, code STEMI called.   According to EMS patient was in normal state of health this morning and without complaints.       Past Medical History:  Diagnosis Date   Diabetes (HCC)    HTN (hypertension)     Patient Active Problem List   Diagnosis Date Noted   Cardiac arrest (HCC) 11/07/2020   Respiratory failure (HCC)    Shock (HCC)     Past Surgical History:  Procedure Laterality Date   ABDOMINAL HYSTERECTOMY     CESAREAN SECTION       OB History   No obstetric history on file.     Family History  Problem Relation Age of Onset   Cancer Mother     Social History   Tobacco Use   Smoking status: Every Day    Types: E-cigarettes, Cigarettes   Smokeless tobacco: Former  Substance Use Topics   Alcohol use: Yes   Drug use: Yes    Types: Marijuana    Comment: once in a while    Home Medications Prior to Admission medications   Medication Sig Start Date End Date Taking? Authorizing Provider  ALPRAZolam Prudy Feeler) 1 MG tablet Take 1 mg by mouth 3 (three) times daily as needed. 11/21/18   [provider]  escitalopram (LEXAPRO) 20 MG tablet Take 20 mg  by mouth daily. 11/10/18   [provider]  HYDROcodone-acetaminophen (NORCO) 10-325 MG tablet Take 1/2 to 1 tablet three times a day as needed for pain 11/19/18   [provider]  metFORMIN (GLUCOPHAGE-XR) 500 MG 24 hr tablet Take 1 tablet twice a day, at breakfast and supper, for control of diabetes. 11/10/18   [provider]  metoprolol succinate (TOPROL-XL) 50 MG 24 hr tablet TAKE 1 TABLET BY MOUTH ONCE DAILY FOR BLOOD PRESSURE AND TO PREVENT PALPITATIONS 11/10/18   [provider]  omeprazole (PRILOSEC) 20 MG capsule TAKE ONE CAPSULE BY MOUTH TWICE DAILY TO CONTROL ACID REFLUX 04/14/18   [provider]  spironolactone-hydrochlorothiazide (ALDACTAZIDE) 25-25 MG tablet TAKE ONE TABLET BY MOUTH ONCE DAILY 11/10/18   [provider]  SUMAtriptan (IMITREX) 50 MG tablet Take one tablet at the earliest sign of a migraine 11/10/18   [provider]  zolpidem (AMBIEN) 10 MG tablet TAKE 1/2 TO 1 (ONE-HALF TO ONE) TABLET BY MOUTH AT BEDTIME AS NEEDED FOR INSOMNIA 11/19/18   [provider]    Allergies    Aspirin, Aspirin buffered, Sertraline, and Sumatriptan  Review of Systems   Review of Systems  Unable to perform ROS: Intubated   Physical Exam Updated Vital Signs BP 125/62   Pulse Marland Kitchen)  54   Temp (P) 99.7 F (37.6 C)   Resp (!) 28   Ht 5\' 4"  (1.626 m)   Wt 69.3 kg   SpO2 100%   BMI 26.22 kg/m   Physical Exam Vitals and nursing note reviewed.  Constitutional:      General: She is not in acute distress.    Appearance: She is well-developed.  HENT:     Head: Normocephalic and atraumatic.  Eyes:     Conjunctiva/sclera: Conjunctivae normal.     Pupils: Pupils are equal, round, and reactive to light.  Cardiovascular:     Rate and Rhythm: Normal rate and regular rhythm.     Heart sounds: No murmur heard. Pulmonary:     Comments: Intubated and mechanically ventilated  Abdominal:     General: There is no distension.      Palpations: Abdomen is soft.  Musculoskeletal:     Cervical back: Neck supple.  Skin:    General: Skin is warm and dry.  Neurological:     Mental Status: She is alert.     Comments: Pupils round and reactive. Opens eyes spontaneously. Moving L arm spontaneously. Does not follow commands.     ED Results / Procedures / Treatments   Labs (all labs ordered are listed, but only abnormal results are displayed) Labs Reviewed  CBC WITH DIFFERENTIAL/PLATELET - Abnormal; Notable for the following components:      Result Value   WBC 18.3 (*)    Hemoglobin 15.3 (*)    HCT 48.6 (*)    MCV 105.9 (*)    Platelets 149 (*)    Neutro Abs 11.4 (*)    Lymphs Abs 5.3 (*)    Abs Immature Granulocytes 0.83 (*)    All other components within normal limits  COMPREHENSIVE METABOLIC PANEL - Abnormal; Notable for the following components:   Sodium 131 (*)    Chloride 95 (*)    CO2 11 (*)    Glucose, Bld 422 (*)    Creatinine, Ser 1.75 (*)    Calcium 8.2 (*)    Total Protein 5.3 (*)    Albumin 2.9 (*)    AST 106 (*)    ALT 61 (*)    Alkaline Phosphatase 148 (*)    GFR, Estimated 33 (*)    Anion gap 25 (*)    All other components within normal limits  LACTIC ACID, PLASMA - Abnormal; Notable for the following components:   Lactic Acid, Venous >9.0 (*)    All other components within normal limits  LACTIC ACID, PLASMA - Abnormal; Notable for the following components:   Lactic Acid, Venous 6.3 (*)    All other components within normal limits  URINALYSIS, ROUTINE W REFLEX MICROSCOPIC - Abnormal; Notable for the following components:   APPearance HAZY (*)    Glucose, UA >=500 (*)    Hgb urine dipstick MODERATE (*)    Protein, ur 100 (*)    Bacteria, UA RARE (*)    All other components within normal limits  RAPID URINE DRUG SCREEN, HOSP PERFORMED - Abnormal; Notable for the following components:   Benzodiazepines POSITIVE (*)    Tetrahydrocannabinol POSITIVE (*)    All other components within  normal limits  CK - Abnormal; Notable for the following components:   Total CK 574 (*)    All other components within normal limits  BASIC METABOLIC PANEL - Abnormal; Notable for the following components:   Sodium 131 (*)    CO2 13 (*)  Glucose, Bld 214 (*)    Creatinine, Ser 1.35 (*)    Calcium 8.2 (*)    GFR, Estimated 44 (*)    Anion gap 18 (*)    All other components within normal limits  BASIC METABOLIC PANEL - Abnormal; Notable for the following components:   Sodium 130 (*)    Potassium 5.5 (*)    CO2 15 (*)    Glucose, Bld 242 (*)    Creatinine, Ser 1.35 (*)    Calcium 8.2 (*)    GFR, Estimated 44 (*)    Anion gap 16 (*)    All other components within normal limits  PHOSPHORUS - Abnormal; Notable for the following components:   Phosphorus 7.6 (*)    All other components within normal limits  ACETAMINOPHEN LEVEL - Abnormal; Notable for the following components:   Acetaminophen (Tylenol), Serum <10 (*)    All other components within normal limits  SALICYLATE LEVEL - Abnormal; Notable for the following components:   Salicylate Lvl <7.0 (*)    All other components within normal limits  GLUCOSE, CAPILLARY - Abnormal; Notable for the following components:   Glucose-Capillary 212 (*)    All other components within normal limits  GLUCOSE, CAPILLARY - Abnormal; Notable for the following components:   Glucose-Capillary 205 (*)    All other components within normal limits  GLUCOSE, CAPILLARY - Abnormal; Notable for the following components:   Glucose-Capillary 202 (*)    All other components within normal limits  GLUCOSE, CAPILLARY - Abnormal; Notable for the following components:   Glucose-Capillary 167 (*)    All other components within normal limits  I-STAT CHEM 8, ED - Abnormal; Notable for the following components:   Sodium 129 (*)    Chloride 97 (*)    Creatinine, Ser 1.40 (*)    Glucose, Bld 409 (*)    Calcium, Ion 0.99 (*)    TCO2 17 (*)    Hemoglobin 17.0 (*)     HCT 50.0 (*)    All other components within normal limits  I-STAT VENOUS BLOOD GAS, ED - Abnormal; Notable for the following components:   pH, Ven 6.875 (*)    pCO2, Ven 69.2 (*)    pO2, Ven 46.0 (*)    Bicarbonate 12.8 (*)    TCO2 15 (*)    Acid-base deficit 22.0 (*)    Sodium 130 (*)    Calcium, Ion 1.00 (*)    HCT 50.0 (*)    Hemoglobin 17.0 (*)    All other components within normal limits  CBG MONITORING, ED - Abnormal; Notable for the following components:   Glucose-Capillary 372 (*)    All other components within normal limits  I-STAT ARTERIAL BLOOD GAS, ED - Abnormal; Notable for the following components:   pH, Arterial 7.142 (*)    pCO2 arterial 50.2 (*)    pO2, Arterial 69 (*)    Bicarbonate 17.6 (*)    TCO2 19 (*)    Acid-base deficit 12.0 (*)    Sodium 132 (*)    Calcium, Ion 1.09 (*)    HCT 49.0 (*)    Hemoglobin 16.7 (*)    All other components within normal limits  POCT I-STAT 7, (LYTES, BLD GAS, ICA,H+H) - Abnormal; Notable for the following components:   pH, Arterial 7.204 (*)    pCO2 arterial 56.2 (*)    pO2, Arterial 70 (*)    Acid-base deficit 7.0 (*)    Calcium, Ion 0.99 (*)  All other components within normal limits  POCT I-STAT 7, (LYTES, BLD GAS, ICA,H+H) - Abnormal; Notable for the following components:   pH, Arterial 7.272 (*)    Bicarbonate 19.4 (*)    TCO2 21 (*)    Acid-base deficit 7.0 (*)    Potassium 3.4 (*)    Calcium, Ion 1.00 (*)    All other components within normal limits  TROPONIN I (HIGH SENSITIVITY) - Abnormal; Notable for the following components:   Troponin I (High Sensitivity) 588 (*)    All other components within normal limits  TROPONIN I (HIGH SENSITIVITY) - Abnormal; Notable for the following components:   Troponin I (High Sensitivity) 1,684 (*)    All other components within normal limits  RESP PANEL BY RT-PCR (FLU A&B, COVID) [id number]  CULTURE, BLOOD (ROUTINE X 2)  CULTURE, BLOOD (ROUTINE X 2)  CULTURE,  RESPIRATORY W GRAM STAIN  MRSA NEXT GEN BY PCR, NASAL  MAGNESIUM  PROCALCITONIN  BETA-HYDROXYBUTYRIC ACID  MAGNESIUM  BLOOD GAS, ARTERIAL  COMPREHENSIVE METABOLIC PANEL  CBC  BLOOD GAS, ARTERIAL  PROCALCITONIN  LACTIC ACID, PLASMA  BASIC METABOLIC PANEL  BETA-HYDROXYBUTYRIC ACID  HEMOGLOBIN A1C  BLOOD GAS, ARTERIAL  BASIC METABOLIC PANEL  BETA-HYDROXYBUTYRIC ACID  BLOOD GAS, ARTERIAL  VOLATILES,BLD-ACETONE,ETHANOL,ISOPROP,METHANOL  COOXEMETRY PANEL    EKG None  Radiology CT HEAD WO CONTRAST ( )  Result Date: 11/05/2020 CLINICAL DATA:  Cardiac arrest EXAM: CT HEAD WITHOUT CONTRAST CT CERVICAL SPINE WITHOUT CONTRAST TECHNIQUE: Multidetector CT imaging of the head and cervical spine was performed following the standard protocol without intravenous contrast. Multiplanar CT image reconstructions of the cervical spine were also generated. COMPARISON:  None. FINDINGS: CT HEAD FINDINGS Brain: No evidence of acute infarction, hemorrhage, hydrocephalus, extra-axial collection or mass lesion/mass effect. Vascular: No hyperdense vessel or unexpected calcification. Skull: Normal. Negative for fracture or focal lesion. Sinuses/Orbits: No acute finding. Other: None. CT CERVICAL SPINE FINDINGS Alignment: Normal. Skull base and vertebrae: No acute fracture. No primary bone lesion or focal pathologic process. Soft tissues and spinal canal: No prevertebral fluid or swelling. No visible canal hematoma. Disc levels: Mild disc space height loss and osteophytosis of the lower cervical levels. Upper chest: Please see separately dictated examination of the chest. Other: None. IMPRESSION: 1. No acute intracranial pathology. 2. No fracture or static subluxation of the cervical spine. 3. Mild multilevel cervical disc degenerative disease. Electronically Signed   By: Jearld Lesch M.D.   On: 11/08/2020 18:25   CT Angio Chest Pulmonary Embolism (PE) W or WO Contrast  Result Date: 10/26/2020 CLINICAL DATA:   Cardiac arrest, PE suspected EXAM: CT ANGIOGRAPHY CHEST WITH CONTRAST TECHNIQUE: Multidetector CT imaging of the chest was performed using the standard protocol during bolus administration of intravenous contrast. Multiplanar CT image reconstructions and MIPs were obtained to evaluate the vascular anatomy. CONTRAST:  28mL OMNIPAQUE IOHEXOL 350 MG/ML SOLN COMPARISON:  None. FINDINGS: Cardiovascular: Satisfactory opacification of the pulmonary arteries to the segmental level. No evidence of pulmonary embolism. Normal heart size. Three-vessel coronary artery calcifications. No pericardial effusion. Aortic atherosclerosis. 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 Mediastinum/Nodes: No enlarged mediastinal, hilar, or axillary lymph nodes. Thyroid gland, trachea, and esophagus demonstrate no significant findings. Lungs/Pleura: Endotracheal intubation. Endotracheal tube tip is at the ostium of the right mainstem bronchus (series 6, image 55). Moderate bilateral pleural effusions and associated atelectasis or consolidation. Diffuse bilateral interlobular septal thickening and ground-glass airspace opacity. Mild underlying centrilobular emphysema. Diffuse bilateral bronchial wall  thickening. Upper Abdomen: No acute abnormality. Musculoskeletal: No chest wall abnormality. No acute or significant osseous findings. Review of the MIP images confirms the above findings. IMPRESSION: 1.  Negative examination for pulmonary embolism. 2. Moderate bilateral pleural effusions and associated atelectasis or consolidation. Diffuse bilateral interlobular septal thickening and ground-glass airspace opacity. Diffuse bilateral bronchial wall thickening. Constellation of findings is consistent with pulmonary edema and or infection. 3. Endotracheal intubation. Endotracheal tube tip is at the ostium of the right mainstem bronchus. Recommend slight retraction. 4.  Mild underlying centrilobular emphysema. 5.   Coronary artery disease. 6. 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 Recommend annual imaging followup by CTA or MRA. This recommendation follows 2010 ACCF/AHA/AATS/ACR/ASA/SCA/SCAI/SIR/STS/SVM Guidelines for the Diagnosis and Management of Patients with Thoracic Aortic Disease. Circulation. 2010; 121: Z610-R604. Aortic aneurysm NOS (ICD10-I71.9) Aortic Atherosclerosis (ICD10-I70.0). Electronically Signed   By: Jearld Lesch M.D.   On: 11/06/2020 18:42   CT Cervical Spine Wo Contrast  Result Date: 11/10/2020 CLINICAL DATA:  Cardiac arrest EXAM: CT HEAD WITHOUT CONTRAST CT CERVICAL SPINE WITHOUT CONTRAST TECHNIQUE: Multidetector CT imaging of the head and cervical spine was performed following the standard protocol without intravenous contrast. Multiplanar CT image reconstructions of the cervical spine were also generated. COMPARISON:  None. FINDINGS: CT HEAD FINDINGS Brain: No evidence of acute infarction, hemorrhage, hydrocephalus, extra-axial collection or mass lesion/mass effect. Vascular: No hyperdense vessel or unexpected calcification. Skull: Normal. Negative for fracture or focal lesion. Sinuses/Orbits: No acute finding. Other: None. CT CERVICAL SPINE FINDINGS Alignment: Normal. Skull base and vertebrae: No acute fracture. No primary bone lesion or focal pathologic process. Soft tissues and spinal canal: No prevertebral fluid or swelling. No visible canal hematoma. Disc levels: Mild disc space height loss and osteophytosis of the lower cervical levels. Upper chest: Please see separately dictated examination of the chest. Other: None. IMPRESSION: 1. No acute intracranial pathology. 2. No fracture or static subluxation of the cervical spine. 3. Mild multilevel cervical disc degenerative disease. Electronically Signed   By: Jearld Lesch M.D.   On: 11/11/2020 18:25   DG CHEST PORT 1 VIEW  Result Date: 11/06/2020 CLINICAL DATA:  Endotracheal tube repositioned.  EXAM: PORTABLE CHEST 1 VIEW COMPARISON:  Chest x-ray 11/06/2020. FINDINGS: Endotracheal tube tip is just at the level of the carina. Enteric tube extends below the diaphragm. The cardiomediastinal silhouette is stable, the heart is mildly enlarged. Diffuse interstitial opacities are again seen. There are increasing airspace opacities throughout the left mid lung. Costophrenic angles are clear. There is no pneumothorax. IMPRESSION: 1. Endotracheal tube tip at the level of the carina. Recommend repositioning. 2. Findings suspicious for pulmonary edema, increasing on the left. Electronically Signed   By: Darliss Cheney M.D.   On: 11/21/2020 17:05   DG Chest Portable 1 View  Result Date: 11/09/2020 CLINICAL DATA:  Cardiac arrest, ETT placement EXAM: PORTABLE CHEST 1 VIEW COMPARISON:  None. FINDINGS: Cardiomegaly. Diffuse bilateral interstitial pulmonary opacity. Endotracheal tube is positioned with tip at the carina. Esophagogastric tube with tip and side port below the diaphragm. IMPRESSION: 1. Endotracheal tube is positioned with tip at the carina. Recommend retraction. 2. Esophagogastric tube with tip and side port below the diaphragm. 3. Diffuse bilateral interstitial pulmonary opacity, likely edema in the setting of cardiomegaly. Electronically Signed   By: Jearld Lesch M.D.   On: 11/02/2020 16:28    Procedures Procedures   Medications Ordered in ED Medications  fentaNYL (SUBLIMAZE) injection 50 mcg (0 mcg  Intravenous Hold 11/04/2020 1627)  acetaminophen (TYLENOL) 160 MG/5ML solution 650 mg (650 mg Per Tube Given 11/23/20 0208)  docusate (COLACE) 50 MG/5ML liquid 100 mg (100 mg Per Tube Given 11/04/2020 2100)  polyethylene glycol (MIRALAX / GLYCOLAX) packet 17 g (17 g Per Tube Not Given 11/19/2020 1959)  fentaNYL in NS (19mcg/ml) infusion-PREMIX (100 mcg/hr Intravenous Infusion Verify 10/27/2020 2323)  fentaNYL (SUBLIMAZE) bolus via infusion 50-100 mcg (has no administration in time range)   midazolam (VERSED) injection 2 mg (2 mg Intravenous Given 11/23/20 0208)  Chlorhexidine Gluconate Cloth 2 % PADS 6 each (6 each Topical Given 11/05/2020 1959)  0.9 %  sodium chloride infusion ( Intravenous Paused 10/27/2020 2056)  insulin regular, human (MYXREDLIN) 100 units/ 100 mL infusion (1.2 Units/hr Intravenous Rate/Dose Change 11/23/20 0106)  dextrose 50 % solution 0-50 mL (has no administration in time range)  potassium chloride 10 mEq in 100 mL IVPB (10 mEq Intravenous Not Given 11/08/2020 2106)  dextrose 5 %-0.45 % sodium chloride infusion ( Intravenous Infusion Verify 10/25/2020 2323)  vancomycin (VANCOCIN) IVPB 1000 mg/200 mL premix (has no administration in time range)  piperacillin-tazobactam (ZOSYN) IVPB 3.375 g (0 g Intravenous Stopped 10/28/2020 2224)  pantoprazole (PROTONIX) injection 40 mg (40 mg Intravenous Given 10/30/2020 2058)  norepinephrine (LEVOPHED) 4-5 MG/250ML-% infusion SOLN (  Canceled Entry 10/27/2020 1947)  0.9 %  sodium chloride infusion (250 mLs Intravenous Not Given 10/30/2020 1949)  phenylephrine (NEO-SYNEPHRINE) 20mg /NS premix infusion (0 mcg/min Intravenous Stopped 11/05/2020 2255)  phenylephrine 0.4-0.9 MG/10ML-% injection (  Not Given 10/30/2020 2104)  phenylephrine 0.4-0.9 MG/10ML-% injection (  Not Given 11/03/2020 2104)  0.9 %  sodium chloride infusion (250 mLs Intravenous Not Given 11/05/2020 2321)  atropine 1 MG/10ML injection (  Not Given 10/31/2020 2321)  norepinephrine (LEVOPHED) 4mg  in 11/24/20 premix infusion (25 mcg/min Intravenous New Bag/Given 11/23/20 0034)  sodium bicarbonate injection 50 mEq (50 mEq Intravenous Given 11/09/2020 1617)  vancomycin (VANCOREADY) IVPB 1250 mg/250 mL (0 mg Intravenous Stopped 10/24/2020 2148)  iohexol (OMNIPAQUE) 350 MG/ML injection 75 mL (75 mLs Intravenous Contrast Given 11/08/2020 1816)  sodium bicarbonate injection 100 mEq (100 mEq Intravenous Given 11/05/2020 2232)  heparin sodium (porcine) injection 1,200 Units (1,200 Units Intracatheter Given 11/23/20  0004)    ED Course  I have reviewed the triage vital signs and the nursing notes.  Pertinent labs & imaging results that were available during my care of the patient were reviewed by me and considered in my medical decision making (see chart for details).    MDM Rules/Calculators/A&P                          On arrival patient is intubated but vitals are stable. Airway intact by end tidal monitoring, and with bilateral breath sounds. Has 2 points of IV access. Code cart at bedside and pads in place on chest. Is hypertensive but stable with strong peripheral pulses. Cardiology at bedside to review EKG. Does not meet stemi criteria. Wide complexes and ST changes more likely c/w post code. Will not activate cath lab. CXR reveals tube is deep, Moved back 2 cm and CXR repeated. No evidence of pneumothorax, pneumonia, or effusion. Diffuse opacities c/f infection vs contusion in setting of CPR.   POC glucose elevated. No signs of trauma on exam and initial hgb 17 on istat. Bedside POCUS without pericardial effusion or evidence of R heart strain. No hypoxia. PE unlikely. Initial istat with normal Na, hgb,  and K. Acidosis to 6.8. Given 1 amp bicarb with improvement to 7.14. Troponin 588. Likely demand in setting of cardiac arrest. Low suspicion for STEMI. No evidence of hypothermia or hypovolemia. UDS pending. No clear cause of PEA arrest at this time. CT head and C spine without acute pathology. Patient moving L arm and opening eyes. Fentanyl gtt started for pain.   Admitted to ICU.   Final Clinical Impression(s) / ED Diagnoses Final diagnoses:  Cardiac arrest [facility])    Rx / DC Orders ED Discharge Orders     None        Doran Clay, MD 11/23/20 0241    Rozelle Logan, DO 11/26/20 1127

## 2020-11-22 NOTE — Procedures (Signed)
Central Venous Catheter Insertion Procedure Note  Samantha Cooley  [id number]  [birth year]  Date:December 08, 2020  Time:11:36 PM   Provider Performing:Shadd Dunstan CHARLES Alexcis Bicking   Procedure: Insertion of Non-tunneled Central Venous Catheter(36556) with US guidance (70488)   Indication(s) Medication administration and Hemodialysis  Consent Unable to obtain consent due to emergent nature of procedure.  Anesthesia Topical only with 1% lidocaine   Timeout Verified patient identification, verified procedure, site/side was marked, verified correct patient position, special equipment/implants available, medications/allergies/relevant history reviewed, required imaging and test results available.  Sterile Technique Maximal sterile technique including full sterile barrier drape, hand hygiene, sterile gown, sterile gloves, mask, hair covering, sterile ultrasound probe cover (if used).  Procedure Description Area of catheter insertion was cleaned with chlorhexidine and draped in sterile fashion.  With real-time ultrasound guidance a HD catheter was placed into the right femoral vein. Nonpulsatile blood flow and easy flushing noted in all ports.  The catheter was sutured in place and sterile dressing applied.  Complications/Tolerance None; patient tolerated the procedure well. Chest X-ray is ordered to verify placement for internal jugular or subclavian cannulation.   Chest x-ray is not ordered for femoral cannulation.  EBL Minimal  Specimen(s) None

## 2020-11-22 NOTE — Progress Notes (Addendum)
eLink Physician-Brief Progress Note Patient Name: Samantha Cooley DOB: 05-31-1960 MRN: [id number]   Date of Service  11/21/2020  HPI/Events of Note  Hypotension - BP = 77/47.  HR = 68. No CVL or CVP.  eICU Interventions  Plan: Phenylephrine IV infusion via PIV. Titrate to MAP = 65.     Intervention Category Major Interventions: Hypotension - evaluation and management  Jackey Housey Dennard Nip 11/20/2020, 7:32 PM

## 2020-11-23 ENCOUNTER — Inpatient Hospital Stay (HOSPITAL_COMMUNITY): Payer: Self-pay

## 2020-11-23 DIAGNOSIS — I469 Cardiac arrest, cause unspecified: Secondary | ICD-10-CM

## 2020-11-23 LAB — COMPREHENSIVE METABOLIC PANEL
ALT: 61 U/L — ABNORMAL HIGH (ref 0–44)
ALT: 91 U/L — ABNORMAL HIGH (ref 0–44)
AST: 106 U/L — ABNORMAL HIGH (ref 15–41)
AST: 190 U/L — ABNORMAL HIGH (ref 15–41)
Albumin: 2.9 g/dL — ABNORMAL LOW (ref 3.5–5.0)
Albumin: 2.7 g/dL — ABNORMAL LOW (ref 3.5–5.0)
Alkaline Phosphatase: 148 U/L — ABNORMAL HIGH (ref 38–126)
Alkaline Phosphatase: 126 U/L (ref 38–126)
Anion gap: 25 — ABNORMAL HIGH (ref 5–15)
Anion gap: 11 (ref 5–15)
BUN: 17 mg/dL (ref 8–23)
BUN: 24 mg/dL — ABNORMAL HIGH (ref 8–23)
CO2: 11 mmol/L — ABNORMAL LOW (ref 22–32)
CO2: 21 mmol/L — ABNORMAL LOW (ref 22–32)
Calcium: 8.2 mg/dL — ABNORMAL LOW (ref 8.9–10.3)
Calcium: 6.8 mg/dL — ABNORMAL LOW (ref 8.9–10.3)
Chloride: 95 mmol/L — ABNORMAL LOW (ref 98–111)
Chloride: 100 mmol/L (ref 98–111)
Creatinine, Ser: 1.75 mg/dL — ABNORMAL HIGH (ref 0.44–1.00)
Creatinine, Ser: 1.89 mg/dL — ABNORMAL HIGH (ref 0.44–1.00)
GFR, Estimated: 33 mL/min — ABNORMAL LOW (ref >60)
GFR, Estimated: 30 mL/min — ABNORMAL LOW (ref >60)
Glucose, Bld: 422 mg/dL — ABNORMAL HIGH (ref 70–99)
Glucose, Bld: 196 mg/dL — ABNORMAL HIGH (ref 70–99)
Potassium: 3.6 mmol/L (ref 3.5–5.1)
Potassium: 3.6 mmol/L (ref 3.5–5.1)
Sodium: 131 mmol/L — ABNORMAL LOW (ref 135–145)
Sodium: 132 mmol/L — ABNORMAL LOW (ref 135–145)
Total Bilirubin: 0.8 mg/dL (ref 0.3–1.2)
Total Bilirubin: 1 mg/dL (ref 0.3–1.2)
Total Protein: 5.3 g/dL — ABNORMAL LOW (ref 6.5–8.1)
Total Protein: 4.8 g/dL — ABNORMAL LOW (ref 6.5–8.1)

## 2020-11-23 LAB — POCT I-STAT 7, (LYTES, BLD GAS, ICA,H+H)
Acid-base deficit: 7 mmol/L — ABNORMAL HIGH (ref 0.0–2.0)
Acid-base deficit: 7 mmol/L — ABNORMAL HIGH (ref 0.0–2.0)
Acid-base deficit: 7 mmol/L — ABNORMAL HIGH (ref 0.0–2.0)
Bicarbonate: 19.4 mmol/L — ABNORMAL LOW (ref 20.0–28.0)
Bicarbonate: 18.5 mmol/L — ABNORMAL LOW (ref 20.0–28.0)
Bicarbonate: 19 mmol/L — ABNORMAL LOW (ref 20.0–28.0)
Calcium, Ion: 1 mmol/L — ABNORMAL LOW (ref 1.15–1.40)
Calcium, Ion: 1.01 mmol/L — ABNORMAL LOW (ref 1.15–1.40)
Calcium, Ion: 0.97 mmol/L — ABNORMAL LOW (ref 1.15–1.40)
HCT: 42 % (ref 36.0–46.0)
HCT: 44 % (ref 36.0–46.0)
HCT: 42 % (ref 36.0–46.0)
Hemoglobin: 14.3 g/dL (ref 12.0–15.0)
Hemoglobin: 15 g/dL (ref 12.0–15.0)
Hemoglobin: 14.3 g/dL (ref 12.0–15.0)
O2 Saturation: 97 %
O2 Saturation: 99 %
O2 Saturation: 86 %
Patient temperature: 37.3
Patient temperature: 38
Patient temperature: 100.2
Potassium: 3.4 mmol/L — ABNORMAL LOW (ref 3.5–5.1)
Potassium: 3.5 mmol/L (ref 3.5–5.1)
Potassium: 3.3 mmol/L — ABNORMAL LOW (ref 3.5–5.1)
Sodium: 137 mmol/L (ref 135–145)
Sodium: 134 mmol/L — ABNORMAL LOW (ref 135–145)
Sodium: 132 mmol/L — ABNORMAL LOW (ref 135–145)
TCO2: 21 mmol/L — ABNORMAL LOW (ref 22–32)
TCO2: 20 mmol/L — ABNORMAL LOW (ref 22–32)
TCO2: 20 mmol/L — ABNORMAL LOW (ref 22–32)
pCO2 arterial: 42.3 mmHg (ref 32.0–48.0)
pCO2 arterial: 40 mmHg (ref 32.0–48.0)
pCO2 arterial: 40 mmHg (ref 32.0–48.0)
pH, Arterial: 7.272 — ABNORMAL LOW (ref 7.350–7.450)
pH, Arterial: 7.278 — ABNORMAL LOW (ref 7.350–7.450)
pH, Arterial: 7.29 — ABNORMAL LOW (ref 7.350–7.450)
pO2, Arterial: 106 mmHg (ref 83.0–108.0)
pO2, Arterial: 140 mmHg — ABNORMAL HIGH (ref 83.0–108.0)
pO2, Arterial: 60 mmHg — ABNORMAL LOW (ref 83.0–108.0)

## 2020-11-23 LAB — BASIC METABOLIC PANEL
Anion gap: 13 (ref 5–15)
BUN: 25 mg/dL — ABNORMAL HIGH (ref 8–23)
CO2: 17 mmol/L — ABNORMAL LOW (ref 22–32)
Calcium: 6.9 mg/dL — ABNORMAL LOW (ref 8.9–10.3)
Chloride: 101 mmol/L (ref 98–111)
Creatinine, Ser: 1.77 mg/dL — ABNORMAL HIGH (ref 0.44–1.00)
GFR, Estimated: 32 mL/min — ABNORMAL LOW (ref >60)
Glucose, Bld: 223 mg/dL — ABNORMAL HIGH (ref 70–99)
Potassium: 3.3 mmol/L — ABNORMAL LOW (ref 3.5–5.1)
Sodium: 131 mmol/L — ABNORMAL LOW (ref 135–145)

## 2020-11-23 LAB — HEMOGLOBIN A1C
Hgb A1c MFr Bld: 6.5 % — ABNORMAL HIGH (ref 4.8–5.6)
Mean Plasma Glucose: 139.85 mg/dL

## 2020-11-23 LAB — CBC
HCT: 43.1 % (ref 36.0–46.0)
Hemoglobin: 14.6 g/dL (ref 12.0–15.0)
MCH: 33.6 pg (ref 26.0–34.0)
MCHC: 33.9 g/dL (ref 30.0–36.0)
MCV: 99.1 fL (ref 80.0–100.0)
Platelets: 127 10*3/uL — ABNORMAL LOW (ref 150–400)
RBC: 4.35 MIL/uL (ref 3.87–5.11)
RDW: 13.2 % (ref 11.5–15.5)
WBC: 18.4 10*3/uL — ABNORMAL HIGH (ref 4.0–10.5)
nRBC: 0 % (ref 0.0–0.2)

## 2020-11-23 LAB — GLUCOSE, CAPILLARY
Glucose-Capillary: 167 mg/dL — ABNORMAL HIGH (ref 70–99)
Glucose-Capillary: 174 mg/dL — ABNORMAL HIGH (ref 70–99)
Glucose-Capillary: 238 mg/dL — ABNORMAL HIGH (ref 70–99)
Glucose-Capillary: 190 mg/dL — ABNORMAL HIGH (ref 70–99)
Glucose-Capillary: 228 mg/dL — ABNORMAL HIGH (ref 70–99)
Glucose-Capillary: 180 mg/dL — ABNORMAL HIGH (ref 70–99)
Glucose-Capillary: 191 mg/dL — ABNORMAL HIGH (ref 70–99)
Glucose-Capillary: 103 mg/dL — ABNORMAL HIGH (ref 70–99)
Glucose-Capillary: 167 mg/dL — ABNORMAL HIGH (ref 70–99)
Glucose-Capillary: 109 mg/dL — ABNORMAL HIGH (ref 70–99)
Glucose-Capillary: 206 mg/dL — ABNORMAL HIGH (ref 70–99)
Glucose-Capillary: 216 mg/dL — ABNORMAL HIGH (ref 70–99)
Glucose-Capillary: 194 mg/dL — ABNORMAL HIGH (ref 70–99)
Glucose-Capillary: 172 mg/dL — ABNORMAL HIGH (ref 70–99)

## 2020-11-23 LAB — PROCALCITONIN: Procalcitonin: 13.62 ng/mL

## 2020-11-23 LAB — ECHOCARDIOGRAM COMPLETE
AR max vel: 2 cm2
AV Area VTI: 1.86 cm2
AV Area mean vel: 1.88 cm2
AV Mean grad: 4 mmHg
AV Peak grad: 8.2 mmHg
Ao pk vel: 1.43 m/s
Area-P 1/2: 1.71 cm2
Height: 64 in
P 1/2 time: 495 msec
S' Lateral: 2.8 cm
Weight: 2564.39 oz

## 2020-11-23 LAB — COOXEMETRY PANEL
Carboxyhemoglobin: 0.3 % — ABNORMAL LOW (ref 0.5–1.5)
Carboxyhemoglobin: 0.7 % (ref 0.5–1.5)
Methemoglobin: 0.9 % (ref 0.0–1.5)
Methemoglobin: 1 % (ref 0.0–1.5)
O2 Saturation: 36.5 %
O2 Saturation: 50.6 %
Total hemoglobin: 13.8 g/dL (ref 12.0–16.0)
Total hemoglobin: 14.2 g/dL (ref 12.0–16.0)

## 2020-11-23 LAB — BETA-HYDROXYBUTYRIC ACID: Beta-Hydroxybutyric Acid: 0.07 mmol/L (ref 0.05–0.27)

## 2020-11-23 LAB — PHOSPHORUS
Phosphorus: 1.9 mg/dL — ABNORMAL LOW (ref 2.5–4.6)
Phosphorus: 1.4 mg/dL — ABNORMAL LOW (ref 2.5–4.6)

## 2020-11-23 LAB — TROPONIN I (HIGH SENSITIVITY): Troponin I (High Sensitivity): 9756 ng/L (ref <18)

## 2020-11-23 LAB — MAGNESIUM
Magnesium: 1.1 mg/dL — ABNORMAL LOW (ref 1.7–2.4)
Magnesium: 2.1 mg/dL (ref 1.7–2.4)

## 2020-11-23 IMAGING — DX DG CHEST 1V PORT
1 series · 1 of 1 positions shown · non-contrast
Comparison: [DATE]

CLINICAL DATA: Shortness of breath

EXAM:
PORTABLE CHEST 1 VIEW

[chest]
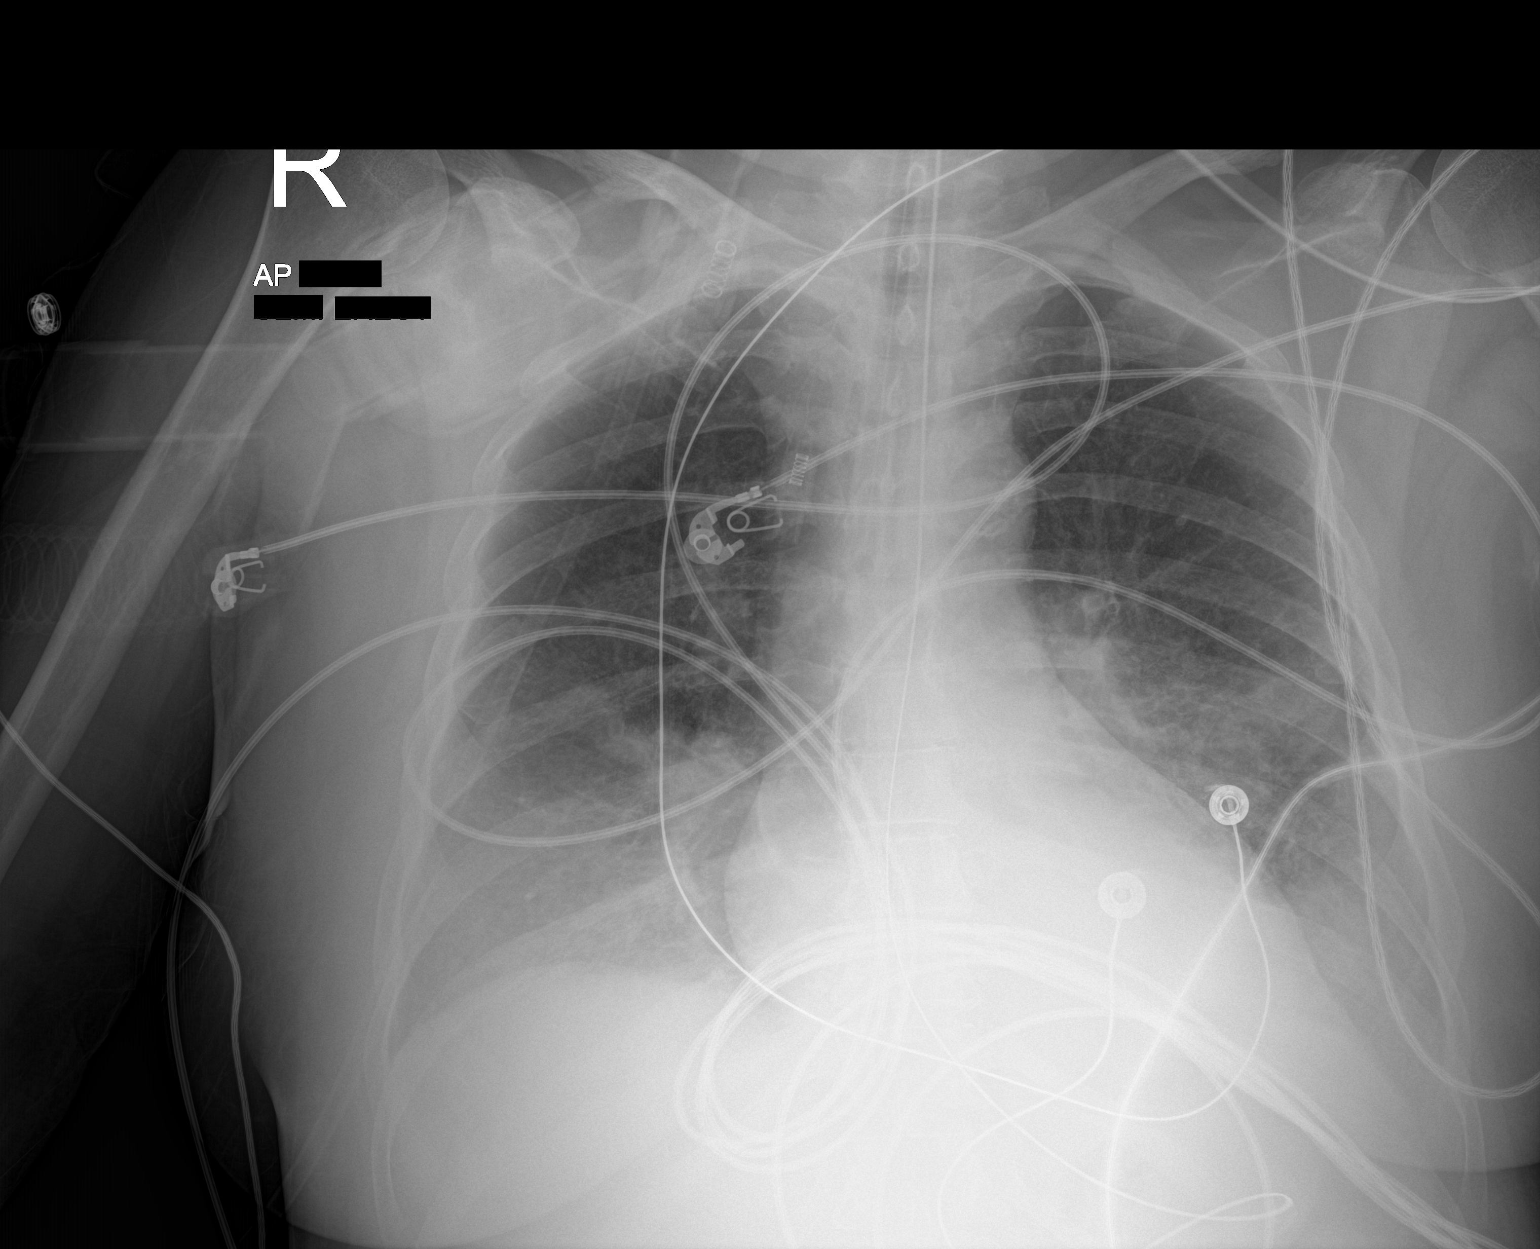

[1 of 1 positions shown; findings below may reference images not displayed]

FINDINGS: The ET tube terminates 1.5 cm above the carina. The NG tube
terminates in the stomach. No pneumothorax. Bibasilar pulmonary
opacities identified. No overt edema. No other interval changes.
IMPRESSION: 1. Support apparatus as above. ET tube terminates 1.5 cm above the
carina. Recommend withdrawing 1 cm.
2. Bibasilar opacities could represent atelectasis, pneumonia, or
layering effusions with underlying atelectasis. Effusions were not
seen on yesterday's CT scan.

These results will be called to the ordering clinician or
representative by the Radiologist Assistant, and communication
documented in the PACS or [REDACTED].

## 2020-11-23 MED ORDER — HEPARIN SODIUM (PORCINE) 5000 UNIT/ML IJ SOLN
5000.0000 [IU] | Freq: Three times a day (TID) | INTRAMUSCULAR | Status: DC
  Administered 2020-11-23 (×2): 5000 [IU] via SUBCUTANEOUS
  Filled 2020-11-23 (×2): qty 1

## 2020-11-23 MED ORDER — VITAL 1.5 CAL PO LIQD: 1000.0000 mL | ORAL | Status: AC

## 2020-11-23 MED ORDER — SODIUM CHLORIDE 0.9% FLUSH
10.0000 mL | Freq: Two times a day (BID) | INTRAVENOUS | Status: DC
Start: 2020-11-23 — End: 2020-11-30
  Administered 2020-11-23 – 2020-11-29 (×10): 10 mL

## 2020-11-23 MED ORDER — POTASSIUM CHLORIDE 20 MEQ PO PACK
40.0000 meq | PACK | Freq: Two times a day (BID) | ORAL | Status: AC
  Administered 2020-11-23 – 2020-11-24 (×3): 40 meq via ORAL
  Filled 2020-11-23 (×3): qty 2

## 2020-11-23 MED ORDER — INSULIN ASPART 100 UNIT/ML IJ SOLN
2.0000 [IU] | INTRAMUSCULAR | Status: DC
  Administered 2020-11-23 (×2): 4 [IU] via SUBCUTANEOUS
  Administered 2020-11-24: 2 [IU] via SUBCUTANEOUS
  Administered 2020-11-24: 4 [IU] via SUBCUTANEOUS
  Administered 2020-11-24: 2 [IU] via SUBCUTANEOUS
  Administered 2020-11-24: 4 [IU] via SUBCUTANEOUS
  Administered 2020-11-24: 2 [IU] via SUBCUTANEOUS
  Administered 2020-11-24 – 2020-11-25 (×3): 4 [IU] via SUBCUTANEOUS
  Administered 2020-11-25: 2 [IU] via SUBCUTANEOUS
  Administered 2020-11-25: 4 [IU] via SUBCUTANEOUS
  Administered 2020-11-26: 2 [IU] via SUBCUTANEOUS
  Administered 2020-11-27: 4 [IU] via SUBCUTANEOUS
  Administered 2020-11-27 (×2): 2 [IU] via SUBCUTANEOUS
  Administered 2020-11-27: 4 [IU] via SUBCUTANEOUS
  Administered 2020-11-27: 2 [IU] via SUBCUTANEOUS
  Administered 2020-11-28 (×3): 4 [IU] via SUBCUTANEOUS

## 2020-11-23 MED ORDER — DOBUTAMINE IN D5W 4-5 MG/ML-% IV SOLN
2.5000 ug/kg/min | INTRAVENOUS | Status: DC
  Administered 2020-11-23: 2.5 ug/kg/min via INTRAVENOUS

## 2020-11-23 MED ORDER — DOBUTAMINE IN D5W 4-5 MG/ML-% IV SOLN
INTRAVENOUS | Status: AC
  Filled 2020-11-23: qty 250

## 2020-11-23 MED ORDER — SODIUM CHLORIDE 0.9 % IV SOLN
2.0000 g | Freq: Every day | INTRAVENOUS | Status: DC
  Administered 2020-11-23: 2 g via INTRAVENOUS
  Filled 2020-11-23: qty 20

## 2020-11-23 MED ORDER — ORAL CARE MOUTH RINSE
15.0000 mL | OROMUCOSAL | Status: DC
  Administered 2020-11-23 – 2020-11-29 (×63): 15 mL via OROMUCOSAL

## 2020-11-23 MED ORDER — PERFLUTREN LIPID MICROSPHERE
1.0000 mL | INTRAVENOUS | Status: AC | PRN
  Administered 2020-11-23: 5 mL via INTRAVENOUS
  Filled 2020-11-23: qty 10

## 2020-11-23 MED ORDER — INSULIN DETEMIR 100 UNIT/ML ~~LOC~~ SOLN
12.0000 [IU] | Freq: Two times a day (BID) | SUBCUTANEOUS | Status: DC
  Administered 2020-11-23 – 2020-11-26 (×6): 12 [IU] via SUBCUTANEOUS
  Filled 2020-11-23 (×11): qty 0.12

## 2020-11-23 MED ORDER — VANCOMYCIN VARIABLE DOSE PER UNSTABLE RENAL FUNCTION (PHARMACIST DOSING): Status: DC

## 2020-11-23 MED ORDER — MAGNESIUM SULFATE 2 GM/50ML IV SOLN
2.0000 g | Freq: Once | INTRAVENOUS | Status: AC
  Administered 2020-11-23: 2 g via INTRAVENOUS
  Filled 2020-11-23: qty 50

## 2020-11-23 MED ORDER — SODIUM CHLORIDE 0.9% FLUSH: 10.0000 mL | INTRAVENOUS | Status: DC | PRN

## 2020-11-23 MED ORDER — CALCIUM GLUCONATE-NACL 1-0.675 GM/50ML-% IV SOLN
1.0000 g | Freq: Once | INTRAVENOUS | Status: AC
  Administered 2020-11-23: 1000 mg via INTRAVENOUS
  Filled 2020-11-23: qty 50

## 2020-11-23 MED ORDER — POTASSIUM PHOSPHATES 15 MMOLE/5ML IV SOLN
15.0000 mmol | Freq: Once | INTRAVENOUS | Status: AC
  Administered 2020-11-24: 15 mmol via INTRAVENOUS
  Filled 2020-11-23: qty 5

## 2020-11-23 MED ORDER — FREE WATER
30.0000 mL | Status: DC
  Administered 2020-11-23 – 2020-11-28 (×19): 30 mL

## 2020-11-23 MED ORDER — PROSOURCE TF PO LIQD
45.0000 mL | Freq: Two times a day (BID) | ORAL | Status: DC
  Administered 2020-11-23 – 2020-11-24 (×3): 45 mL
  Filled 2020-11-23 (×3): qty 45

## 2020-11-23 MED ORDER — CHLORHEXIDINE GLUCONATE 0.12% ORAL RINSE (MEDLINE KIT)
15.0000 mL | Freq: Two times a day (BID) | OROMUCOSAL | Status: DC
  Administered 2020-11-23 – 2020-11-29 (×13): 15 mL via OROMUCOSAL

## 2020-11-23 MED ORDER — VITAL HIGH PROTEIN PO LIQD
1000.0000 mL | ORAL | Status: DC
  Administered 2020-11-23: 1000 mL

## 2020-11-23 NOTE — Progress Notes (Signed)
Removed IO on Lt. Tibia without any complications. Applied dressing with Vaseline gauze & 2x2 gauze dressing. HS [employer]

## 2020-11-23 NOTE — Progress Notes (Signed)
Pharmacy Antibiotic Note  Samantha Cooley is a 60 y.o. female admitted on 11/02/2020 presenting post-CPR, concern for sepsis also aspiration.  Pharmacy has been consulted for vancomycin and zosyn dosing.  Patient's Scr increased from 1.35>>1.77 so will move to variable vancomycin dosing by level. Vancomycin random level scheduled for 10/2 at 0730 AM (trough timing, but only 1 dose). Will adjust vancomycin based upon VR and Scr. Ok to continue Zosyn as is per renal function.   10/: Resp culture in progress 9/30: UA >500 glucose, rare bacteria 9/30 Bcx: NGTD  Plan: Vancomycin 1250 mg IV x 1 VR 10/2 0730 Zosyn 3.375g IV q 8h (extended infusion) Monitor renal function, Cx and clinical progression to narrow  Height: 5\' 4"  (162.6 cm) Weight: 72.7 kg (160 lb 4.4 oz) IBW/kg (Calculated) : 54.7  Temp (24hrs), Avg:98.1 F (36.7 C), Min:85.8 F (29.9 C), Max:100.4 F (38 C)  Recent Labs  Lab 11/04/2020 1610 11/01/2020 1614 10/30/2020 1810 11/15/2020 1900 11/05/2020 2020 11/23/20 0157 11/23/20 0810  WBC 18.3*  --   --   --   --  18.4*  --   CREATININE 1.75* 1.40*  --  1.35* 1.35* 1.89* 1.77*  LATICACIDVEN >9.0*  --  6.3*  --   --   --   --      Estimated Creatinine Clearance: 32.2 mL/min (A) (by C-G formula based on SCr of 1.77 mg/dL (H)).    Allergies  Allergen Reactions   Aspirin    Aspirin Buffered Other (See Comments)    unspecified   Sertraline Other (See Comments)    Face swelled up.   Sumatriptan Other (See Comments)    In a fog.  Like a zombie.    01/23/21, PharmD PGY2 Cardiology Pharmacy Resident Phone: [phone] 11/23/2020  12:36 PM  Please check [email or web address] for unit-specific pharmacy phone numbers.

## 2020-11-23 NOTE — H&P (Signed)
NAME:  Samantha Cooley, MRN:  [id number], DOB:  10/01/1960, LOS: 1 ADMISSION DATE:  10/24/2020, CONSULTATION DATE:  9/30  REFERRING MD:  Horton, CHIEF COMPLAINT:  cardiac arrest    History of Present Illness:  60 year old white female with history as per below.  Presented to the emergency room on 9/30 status post PEA arrest.  History obtained in the emergency room: Was in normal state of health, had been on phone with her significant other around 2 PM, later at 2:50 PM found unresponsive and pulseless by significant other.  EMS was called, and significant other initiated CPR.  EMS arrival noted to be around 2:55 PM.  Initial rhythm PEA.  Return of spontaneous circulation estimated at approximately 15 minutes.  On arrival to the emergency room intubated, requiring high FiO2, initially hypertensive, improved with fentanyl.  Would open eyes, not appropriately, stick her tongue out, had equal muscle tone bilaterally and would lift arms spontaneously but could not follow commands with lower extremities. Preliminary evaluation in the ER: Glucose 372 White blood cell count 18.3 creatinine 1.75 anion gap metabolic acidosis with serum bicarbonate of 11, lactate of greater than 9 peak troponin of 588.   -Bedside ultrasound was negative for pericardial effusion RV function normal, LV function difficult to assess.  Seen by cardiology, did not think this was consistent with STEMI Pertinent  Medical History  HTN, diabetes, anxiety, depression, insomnia.  Significant Hospital Events: Including procedures, antibiotic start and stop dates in addition to other pertinent events   9/30 admitted s/p arrest, intubated in field, seen by cardiology and felt not primary ST elevation event.  Suspected more metabolically driven.  On presentation had severe anion gap metabolic acidosis, hyperglycemia, and encephalopathy but was following some simple commands such as sticking tongue out and nodding appropriately. Vanc/zosyn started.  CT head neck and chest ordered.   Interim History / Subjective:  Weaning vasopressor support overnight.  Patient will awaken and follow commands  Objective   Blood pressure (!) 121/48, pulse (!) 52, temperature 100.2 F (37.9 C), resp. rate (!) 28, height 5\' 4"  (1.626 m), weight 72.7 kg, SpO2 92 %.    Vent Mode: PRVC FiO2 (%):  [80 %-100 %] 80 % Set Rate:  [22 bmp-32 bmp] 32 bmp Vt Set:  [440 mL] 440 mL PEEP:  [5 cmH20-12 cmH20] 12 cmH20 Plateau Pressure:  [22 cmH20-27 cmH20] 27 cmH20   Intake/Output Summary (Last 24 hours) at 11/23/2020 0945 Last data filed at 11/23/2020 0701 Gross per 24 hour  Intake 3312.14 ml  Output 295 ml  Net 3017.14 ml    Filed Weights   11/02/2020 1645 11/23/20 0500  Weight: 69.3 kg 72.7 kg    Examination: General: Chronically ill-appearing 60 year old white female currently sedated on fentanyl infusion HENT: Normocephalic atraumatic orally intubated mucous membranes are moist pupils reactive Lungs: Normal vesicular breath sounds on both sides.  Improving oxygenation.   Cardiovascular: Regular rate and rhythm without murmur rub or gallop Abdomen: Soft not tender Extremities: Warm extremities Neuro: Eyes open.  Tracks to voice.  Follows commands. GU: Clear urine in Foley catheter  Point-of-care echocardiogram shows normal RV function.  LV moderately depressed with possible stress cardiomyopathy pattern.  IVC greater than 2 cm with low distensibility index  Resolved Hospital Problem list     Assessment & Plan:   Critically ill due to PEA cardiac arrest with acute hypoxic respiratory failure and aspiration pneumonia requiring mechanical ventilation. Critically ill due to undifferentiated shock likely distributive following cardiac  arrest.  Possible component of stress cardiomyopathy causing cardiogenic shock, requiring titration of norepinephrine and dobutamine Hypoxic ischemic encephalopathy appears to be resolving. Stress hyperglycemia beta  hydroxybutyrate negative AKI.  Suspect secondary to volume depletion and organ hypoperfusion  Overall picture is improving.  Suspect possible stress cardiomyopathy either as a precipitating cause of or consequence of cardiac arrest.  This will need to be sorted out downstream.  Overall perfusion improving.  Oxygenation improving.  Plan:  -Wean FiO2 and PEEP.  Initiate SBT once on minimal ventilator settings -Stop dobutamine.  Repeat SCV O2 -Titrate norepinephrine to keep MAP greater than 65. -Follow-up formal echocardiogram. -Continue empiric antibiotics pending finalization of cultures. -Transition to subcutaneous insulin. -Stop fluid resuscitation.  Best Practice (right click and "Reselect all SmartList Selections" daily)   Diet/type: NPO DVT prophylaxis: SCD GI prophylaxis: PPI Lines: N/A Foley:  Yes, and it is still needed Code Status:  full code Last date of multidisciplinary goals of care discussion [pending]  Labs   CBC: Recent Labs  Lab 11/15/2020 1610 10/28/2020 1614 10/27/2020 2340 11/23/20 0101 11/23/20 0157 11/23/20 0519 11/23/20 0728  WBC 18.3*  --   --   --  18.4*  --   --   NEUTROABS 11.4*  --   --   --   --   --   --   HGB 15.3*   < > 15.0 14.3 14.6 15.0 14.3  HCT 48.6*   < > 44.0 42.0 43.1 44.0 42.0  MCV 105.9*  --   --   --  99.1  --   --   PLT 149*  --   --   --  127*  --   --    < > = values in this interval not displayed.     Basic Metabolic Panel: Recent Labs  Lab 11/07/2020 1610 11/10/2020 1614 11/20/2020 1710 11/05/2020 1900 11/13/2020 2020 11/06/2020 2340 11/23/20 0101 11/23/20 0157 11/23/20 0519 11/23/20 0728  NA 131* 129*   < > 130* 131* 137 137 132* 134* 132*  K 3.6 3.6   < > 5.5* 5.0 4.1 3.4* 3.6 3.5 3.3*  CL 95* 97*  --  99 100  --   --  100  --   --   CO2 11*  --   --  15* 13*  --   --  21*  --   --   GLUCOSE 422* 409*  --  242* 214*  --   --  196*  --   --   BUN 17 21  --  19 20  --   --  24*  --   --   CREATININE 1.75* 1.40*  --  1.35*  1.35*  --   --  1.89*  --   --   CALCIUM 8.2*  --   --  8.2* 8.2*  --   --  6.8*  --   --   MG 2.3  --   --   --  1.9  --   --   --   --   --   PHOS  --   --   --   --  7.6*  --   --   --   --   --    < > = values in this interval not displayed.    GFR: Estimated Creatinine Clearance: 30.2 mL/min (A) (by C-G formula based on SCr of 1.89 mg/dL (H)). Recent Labs  Lab 11/07/2020 1610 11/20/2020 1810 11/14/2020 2020  11/23/20 0157  PROCALCITON  --   --  2.11 13.62  WBC 18.3*  --   --  18.4*  LATICACIDVEN >9.0* 6.3*  --   --      Liver Function Tests: Recent Labs  Lab Dec 01, 2020 1610 11/23/20 0157  AST 106* 190*  ALT 61* 91*  ALKPHOS 148* 126  BILITOT 0.8 1.0  PROT 5.3* 4.8*  ALBUMIN 2.9* 2.7*    No results for input(s): LIPASE, AMYLASE in the last 168 hours. No results for input(s): AMMONIA in the last 168 hours.  ABG    Component Value Date/Time   PHART 7.290 (L) 11/23/2020 0728   PCO2ART 40.0 11/23/2020 0728   PO2ART 60 (L) 11/23/2020 0728   HCO3 19.0 (L) 11/23/2020 0728   TCO2 20 (L) 11/23/2020 0728   ACIDBASEDEF 7.0 (H) 11/23/2020 0728   O2SAT 86.0 11/23/2020 0728      Coagulation Profile: No results for input(s): INR, PROTIME in the last 168 hours.  Cardiac Enzymes: Recent Labs  Lab 12-01-2020 2020  CKTOTAL 574*    HbA1C: Hgb A1c MFr Bld  Date/Time Value Ref Range Status  11/23/2020 02:44 AM 6.5 (H) 4.8 - 5.6 % Final    Comment:    (NOTE) Pre diabetes:          5.7%-6.4%  Diabetes:              >6.4%  Glycemic control for   <7.0% adults with diabetes     CBG: Recent Labs  Lab 11/23/20 0308 11/23/20 0522 11/23/20 0659 11/23/20 0825 11/23/20 0925  GLUCAP 174* 238* 190* 228* 180*   CRITICAL CARE Performed by: Lynnell Catalan   Total critical care time: 40 minutes  Critical care time was exclusive of separately billable procedures and treating other patients.  Critical care was necessary to treat or prevent imminent or life-threatening  deterioration.  Critical care was time spent personally by me on the following activities: development of treatment plan with patient and/or surrogate as well as nursing, discussions with consultants, evaluation of patient's response to treatment, examination of patient, obtaining history from patient or surrogate, ordering and performing treatments and interventions, ordering and review of laboratory studies, ordering and review of radiographic studies, pulse oximetry, re-evaluation of patient's condition and participation in multidisciplinary rounds.  Lynnell Catalan, MD [facility] ICU Physician [facility] [place] [facility]  Pager: [phone] Mobile: [phone] After hours: [phone].

## 2020-11-23 NOTE — Progress Notes (Signed)
eLink Physician-Brief Progress Note Patient Name: Samantha Cooley DOB: 1960/04/17 MRN: [id number]   Date of Service  11/23/2020  HPI/Events of Note  Multiple issues: 1. COOX = 36.5. Hgb = 15. 2. ABG on 80%/PRVC 28/TV = 440/P 12 = 7.27/40/140/18.5. 3. Hypocalcemia - iCa++ = 1.01 and K+ = 3.6.   eICU Interventions  Plan: Increase PRVC rate to 32. Repeat ABG at 8 AM. Dobutamine IV infusion at 2.5 mcg/kg/min. Repeat COOX at 9 AM. Replace Ca++ Will not replace K+ at this time d/t deteriorating renal function.     Intervention Category Major Interventions: Acid-Base disturbance - evaluation and management;Respiratory failure - evaluation and management;Electrolyte abnormality - evaluation and management;Shock - evaluation and management  [name] 11/23/2020, 6:42 AM

## 2020-11-23 NOTE — Progress Notes (Signed)
Brief Nutrition Note  Consult received for enteral/tube feeding initiation and management.  60 year old female with hx of DM and HTN presented to ED s/p PEA. Found pulseless and unresponsive by her significant other. Intubated by EMS. Cardiology believes event was metabolically driven rather than a STEMI.  Patient is currently intubated on ventilator support. OGT in place, imagine confirms gastric placement  Adult Enteral Nutrition Protocol initiated. Full assessment to follow.  Recommend adjusting to the following regimen: Vital 1.5 @ 69mL/h with 2 packets of prosource TF daily (40kcal and 11g of protein per packet) Free water flush: 57mL q4h to maintain tube patency This will provide 1880 kcal, 103g of protein, and of free water (TF+flush)  MV: 12.8L/min Temp (24hrs), Avg:98.3 F (36.8 C), Min:85.8 F (29.9 C), Max:100.4 F (38 C)  Admitting Dx: Cardiac arrest (HCC) [I46.9] Respiratory failure (HCC) [J96.90]  Body mass index is 27.51 kg/m. Pt meets criteria for overweight based on current BMI.  Labs: Recent Labs  Lab 2020-12-05 1610 Dec 05, 2020 1614 12-05-2020 2020 2020/12/05 2340 11/23/20 0157 11/23/20 0519 11/23/20 0728 11/23/20 0810  NA 131*   < > 131*   < > 132* 134* 132* 131*  K 3.6   < > 5.0   < > 3.6 3.5 3.3* 3.3*  CL 95*   < > 100  --  100  --   --  101  CO2 11*   < > 13*  --  21*  --   --  17*  BUN 17   < > 20  --  24*  --   --  25*  CREATININE 1.75*   < > 1.35*  --  1.89*  --   --  1.77*  CALCIUM 8.2*   < > 8.2*  --  6.8*  --   --  6.9*  MG 2.3  --  1.9  --   --   --   --  1.1*  PHOS  --   --  7.6*  --   --   --   --  1.9*  GLUCOSE 422*   < > 214*  --  196*  --   --  223*   < > = values in this interval not displayed.   Medications: Scheduled Meds:  docusate  100 mg Per Tube BID   feeding supplement (PROSource TF)  45 mL Per Tube BID   feeding supplement (VITAL HIGH PROTEIN)  36mL/h Per Tube    insulin aspart  2-6 Units Subcutaneous Q4H   insulin  detemir  12 Units Subcutaneous Q12H   pantoprazole (PROTONIX) IV  40 mg Intravenous Q24H   polyethylene glycol  17 g Per Tube Daily   potassium chloride  40 mEq Oral BID   Continuous Infusions:  fentaNYL infusion INTRAVENOUS 125 mcg/hr (11/23/20 1200)   norepinephrine (LEVOPHED) Adult infusion 17 mcg/min (11/23/20 1246)    Greig Castilla, RD, LDN Clinical Dietitian Pager on Amion

## 2020-11-23 NOTE — Progress Notes (Signed)
  Echocardiogram 2D Echocardiogram has been performed.  Gerda Diss 11/23/2020, 2:23 PM

## 2020-11-23 NOTE — Progress Notes (Signed)
eLink Physician-Brief Progress Note Patient Name: SAPHYRA HUTT DOB: [birth year] MRN: [id number]   Date of Service  11/23/2020  HPI/Events of Note  Notified of Phos level 1.4 (5 pm) No replacements given yet Creatinine 1.77  eICU Interventions  Ordered Kphos 15 mmol IV x 1     Intervention Category Intermediate Interventions: Electrolyte abnormality - evaluation and management  Darl Pikes 11/23/2020, 11:54 PM

## 2020-11-23 NOTE — Progress Notes (Signed)
Inpatient Diabetes Program Recommendations  AACE/ADA: New Consensus Statement on Inpatient Glycemic Control (2015)  Target Ranges:  Prepandial:   less than 140 mg/dL      Peak postprandial:   less than 180 mg/dL (1-2 hours)      Critically ill patients:  140 - 180 mg/dL   Lab Results  Component Value Date   GLUCAP 180 (H) 11/23/2020   HGBA1C 6.5 (H) 11/23/2020    Review of Glycemic Control  Diabetes history: DM2 Outpatient Diabetes medications: Metformin 500 mg bid Current orders for Inpatient glycemic control: IV insulin  Inpatient Diabetes Program Recommendations:   Consider on transition from IV insulin: -Levemir 5 units bid (give first dose 2 hrs. Prior to D/C of IV insulin) -Novolog 0-9 units q 4 hrs. Correction  Will follow during hospitalization.  Thank you, Billy Fischer. Jody Aguinaga, RN, MSN, CDE  Diabetes Coordinator Inpatient Glycemic Control Team Team Pager [phone] (8am-5pm) 11/23/2020 9:48 AM

## 2020-11-23 DEATH — deceased

## 2020-11-24 ENCOUNTER — Inpatient Hospital Stay (HOSPITAL_COMMUNITY): Payer: Self-pay

## 2020-11-24 LAB — CBC
HCT: 32.9 % — ABNORMAL LOW (ref 36.0–46.0)
Hemoglobin: 10.5 g/dL — ABNORMAL LOW (ref 12.0–15.0)
MCH: 33.4 pg (ref 26.0–34.0)
MCHC: 31.9 g/dL (ref 30.0–36.0)
MCV: 104.8 fL — ABNORMAL HIGH (ref 80.0–100.0)
Platelets: 61 10*3/uL — ABNORMAL LOW (ref 150–400)
RBC: 3.14 MIL/uL — ABNORMAL LOW (ref 3.87–5.11)
RDW: 13.9 % (ref 11.5–15.5)
WBC: 8.8 10*3/uL (ref 4.0–10.5)
nRBC: 0 % (ref 0.0–0.2)

## 2020-11-24 LAB — CBC WITH DIFFERENTIAL/PLATELET
Abs Immature Granulocytes: 0.03 10*3/uL (ref 0.00–0.07)
Basophils Absolute: 0 10*3/uL (ref 0.0–0.1)
Basophils Relative: 0 %
Eosinophils Absolute: 0 10*3/uL (ref 0.0–0.5)
Eosinophils Relative: 0 %
HCT: 32.5 % — ABNORMAL LOW (ref 36.0–46.0)
Hemoglobin: 11.1 g/dL — ABNORMAL LOW (ref 12.0–15.0)
Immature Granulocytes: 0 %
Lymphocytes Relative: 18 %
Lymphs Abs: 1.7 10*3/uL (ref 0.7–4.0)
MCH: 33.9 pg (ref 26.0–34.0)
MCHC: 34.2 g/dL (ref 30.0–36.0)
MCV: 99.4 fL (ref 80.0–100.0)
Monocytes Absolute: 0.4 10*3/uL (ref 0.1–1.0)
Monocytes Relative: 4 %
Neutro Abs: 7.4 10*3/uL (ref 1.7–7.7)
Neutrophils Relative %: 78 %
Platelets: UNDETERMINED 10*3/uL (ref 150–400)
RBC: 3.27 MIL/uL — ABNORMAL LOW (ref 3.87–5.11)
RDW: 13.3 % (ref 11.5–15.5)
Smear Review: UNDETERMINED
WBC: 9.5 10*3/uL (ref 4.0–10.5)
nRBC: 0 % (ref 0.0–0.2)

## 2020-11-24 LAB — COMPREHENSIVE METABOLIC PANEL
ALT: 48 U/L — ABNORMAL HIGH (ref 0–44)
AST: 51 U/L — ABNORMAL HIGH (ref 15–41)
Albumin: 2.3 g/dL — ABNORMAL LOW (ref 3.5–5.0)
Alkaline Phosphatase: 70 U/L (ref 38–126)
Anion gap: 10 (ref 5–15)
BUN: 21 mg/dL (ref 8–23)
CO2: 16 mmol/L — ABNORMAL LOW (ref 22–32)
Calcium: 6.9 mg/dL — ABNORMAL LOW (ref 8.9–10.3)
Chloride: 106 mmol/L (ref 98–111)
Creatinine, Ser: 1.3 mg/dL — ABNORMAL HIGH (ref 0.44–1.00)
GFR, Estimated: 46 mL/min — ABNORMAL LOW (ref >60)
Glucose, Bld: 178 mg/dL — ABNORMAL HIGH (ref 70–99)
Potassium: 4.1 mmol/L (ref 3.5–5.1)
Sodium: 132 mmol/L — ABNORMAL LOW (ref 135–145)
Total Bilirubin: 0.7 mg/dL (ref 0.3–1.2)
Total Protein: 4.3 g/dL — ABNORMAL LOW (ref 6.5–8.1)

## 2020-11-24 LAB — GLUCOSE, CAPILLARY
Glucose-Capillary: 139 mg/dL — ABNORMAL HIGH (ref 70–99)
Glucose-Capillary: 140 mg/dL — ABNORMAL HIGH (ref 70–99)
Glucose-Capillary: 147 mg/dL — ABNORMAL HIGH (ref 70–99)
Glucose-Capillary: 152 mg/dL — ABNORMAL HIGH (ref 70–99)
Glucose-Capillary: 177 mg/dL — ABNORMAL HIGH (ref 70–99)
Glucose-Capillary: 180 mg/dL — ABNORMAL HIGH (ref 70–99)
Glucose-Capillary: 184 mg/dL — ABNORMAL HIGH (ref 70–99)

## 2020-11-24 LAB — HEPARIN LEVEL (UNFRACTIONATED)
Heparin Unfractionated: 0.5 IU/mL (ref 0.30–0.70)
Heparin Unfractionated: 0.26 IU/mL — ABNORMAL LOW (ref 0.30–0.70)

## 2020-11-24 LAB — TROPONIN I (HIGH SENSITIVITY)
Troponin I (High Sensitivity): 588 ng/L (ref <18)
Troponin I (High Sensitivity): 2818 ng/L (ref <18)

## 2020-11-24 LAB — MAGNESIUM
Magnesium: 1.6 mg/dL — ABNORMAL LOW (ref 1.7–2.4)
Magnesium: 1.9 mg/dL (ref 1.7–2.4)

## 2020-11-24 LAB — PHOSPHORUS
Phosphorus: 1.9 mg/dL — ABNORMAL LOW (ref 2.5–4.6)
Phosphorus: 2.8 mg/dL (ref 2.5–4.6)

## 2020-11-24 IMAGING — DX DG CHEST 1V PORT
1 series · 1 of 1 positions shown · non-contrast
Comparison: [DATE]

CLINICAL DATA: Pulmonary edema

EXAM:
PORTABLE CHEST 1 VIEW

[chest]
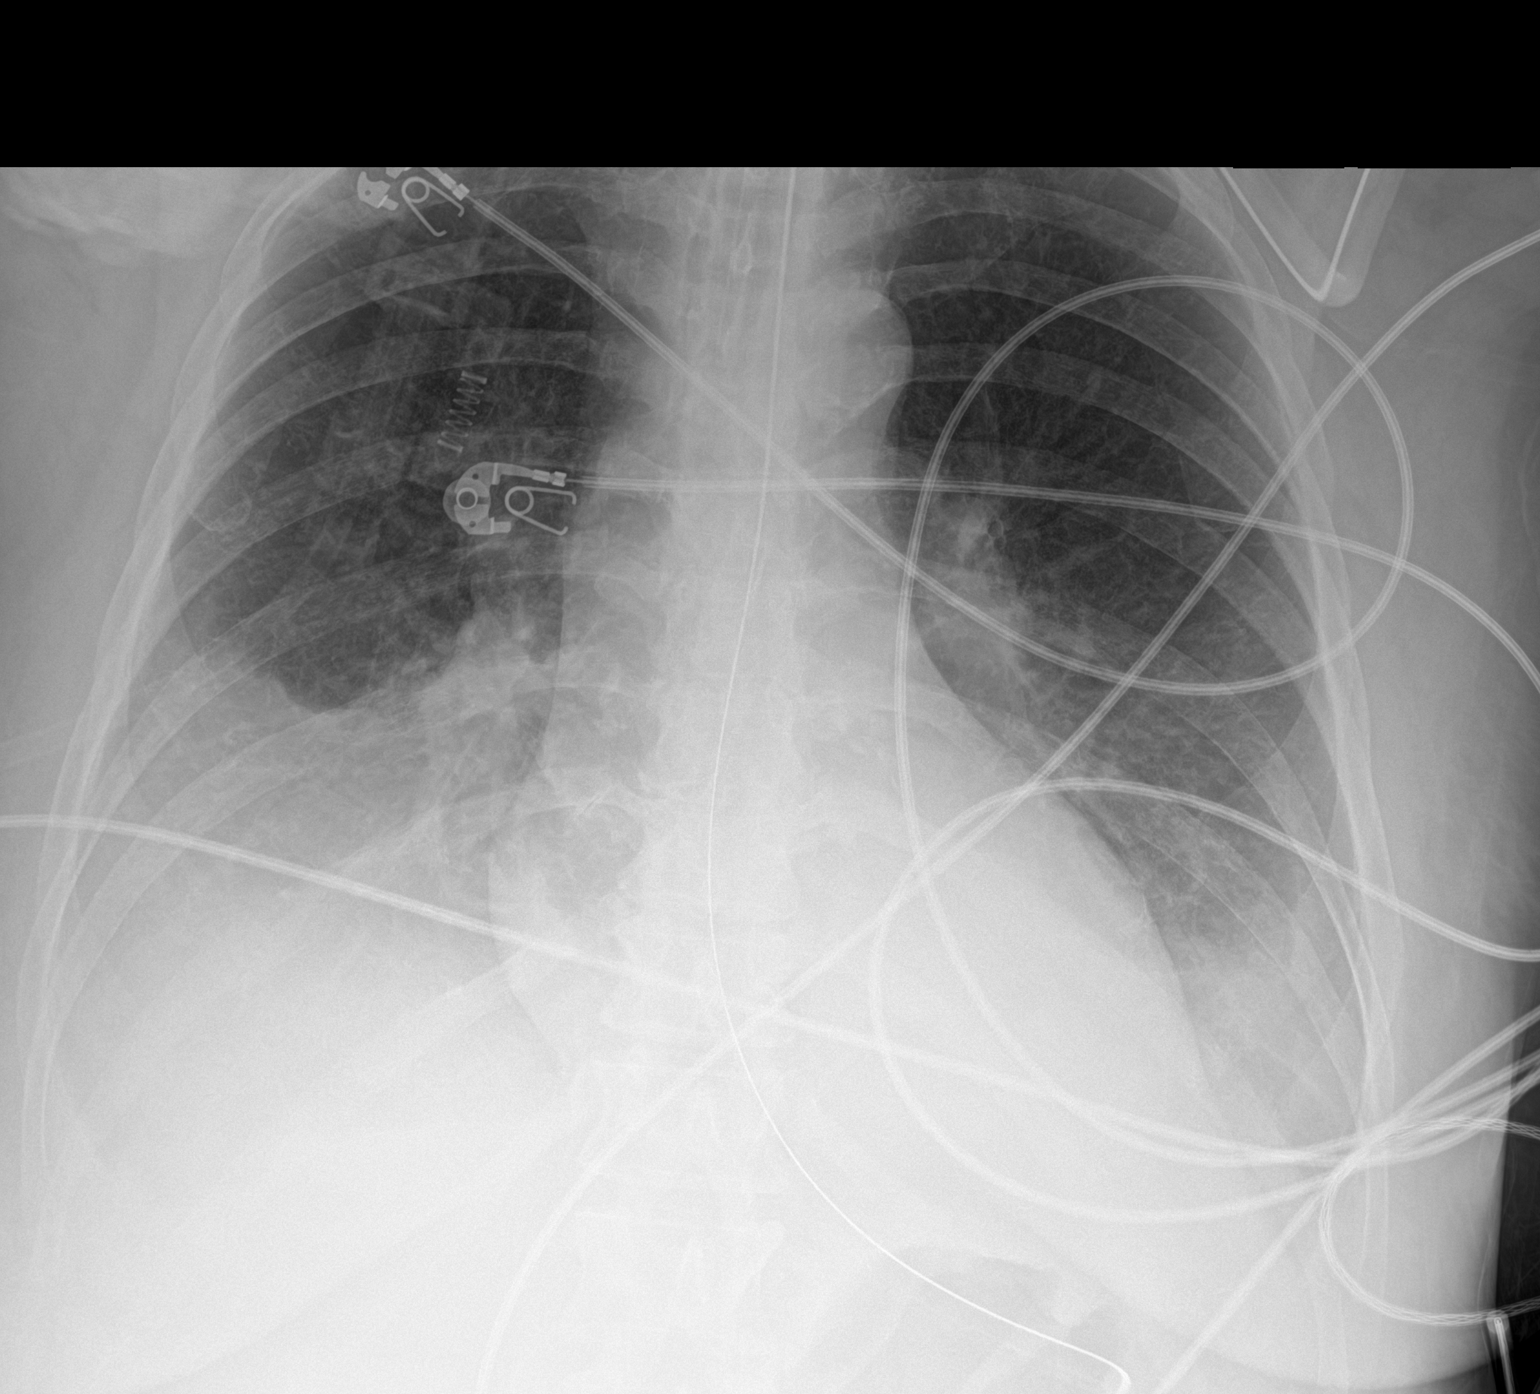

[1 of 1 positions shown; findings below may reference images not displayed]

FINDINGS: The ETT is in good position. The NG tube terminates below today's
film. No pneumothorax. Bilateral pleural effusions with underlying
atelectasis, mildly more pronounced in the interval. No change in
the cardiomediastinal silhouette. No other abnormalities.
IMPRESSION: Bilateral pleural effusions with underlying atelectasis are mildly
more pronounced in the interval. No other acute abnormalities or
changes.

## 2020-11-24 MED ORDER — HEPARIN (PORCINE) 25000 UT/250ML-% IV SOLN
1200.0000 [IU]/h | INTRAVENOUS | Status: DC
  Administered 2020-11-24: 1100 [IU]/h via INTRAVENOUS
  Filled 2020-11-24: qty 250

## 2020-11-24 MED ORDER — FUROSEMIDE 10 MG/ML IJ SOLN
40.0000 mg | Freq: Once | INTRAMUSCULAR | Status: AC
  Administered 2020-11-24: 40 mg via INTRAVENOUS
  Filled 2020-11-24: qty 4

## 2020-11-24 MED ORDER — LORAZEPAM 1 MG PO TABS: 2.0000 mg | ORAL_TABLET | Freq: Four times a day (QID) | ORAL | Status: DC | PRN

## 2020-11-24 MED ORDER — VITAL HIGH PROTEIN PO LIQD
1000.0000 mL | ORAL | Status: DC
  Administered 2020-11-24: 1000 mL

## 2020-11-24 MED ORDER — HEPARIN BOLUS VIA INFUSION
4000.0000 [IU] | Freq: Once | INTRAVENOUS | Status: AC
  Administered 2020-11-24: 4000 [IU] via INTRAVENOUS
  Filled 2020-11-24: qty 4000

## 2020-11-24 MED ORDER — CALCIUM GLUCONATE-NACL 1-0.675 GM/50ML-% IV SOLN
1.0000 g | Freq: Once | INTRAVENOUS | Status: AC
  Administered 2020-11-24: 1000 mg via INTRAVENOUS
  Filled 2020-11-24: qty 50

## 2020-11-24 MED ORDER — ESCITALOPRAM OXALATE 10 MG PO TABS: 20.0000 mg | ORAL_TABLET | Freq: Every day | ORAL | Status: DC

## 2020-11-24 MED ORDER — MAGNESIUM SULFATE 2 GM/50ML IV SOLN
2.0000 g | Freq: Once | INTRAVENOUS | Status: AC
  Administered 2020-11-24: 2 g via INTRAVENOUS
  Filled 2020-11-24: qty 50

## 2020-11-24 MED ORDER — ESCITALOPRAM OXALATE 10 MG PO TABS
20.0000 mg | ORAL_TABLET | Freq: Every day | ORAL | Status: DC
  Administered 2020-11-24 – 2020-11-27 (×3): 20 mg
  Filled 2020-11-24 (×3): qty 2

## 2020-11-24 MED ORDER — PROSOURCE TF PO LIQD
45.0000 mL | Freq: Three times a day (TID) | ORAL | Status: DC
  Administered 2020-11-24 – 2020-11-28 (×6): 45 mL
  Filled 2020-11-24 (×6): qty 45

## 2020-11-24 MED ORDER — IPRATROPIUM-ALBUTEROL 0.5-2.5 (3) MG/3ML IN SOLN
3.0000 mL | RESPIRATORY_TRACT | Status: DC
  Administered 2020-11-24 (×2): 3 mL via RESPIRATORY_TRACT
  Filled 2020-11-24 (×3): qty 3

## 2020-11-24 MED ORDER — SODIUM CHLORIDE 0.9 % IV SOLN: 2.0000 g | Freq: Every day | INTRAVENOUS | Status: DC

## 2020-11-24 MED ORDER — LORAZEPAM 1 MG PO TABS
2.0000 mg | ORAL_TABLET | Freq: Four times a day (QID) | ORAL | Status: DC | PRN
  Administered 2020-11-24 – 2020-11-26 (×6): 2 mg
  Filled 2020-11-24 (×6): qty 2

## 2020-11-24 MED ORDER — HEPARIN (PORCINE) 25000 UT/250ML-% IV SOLN
1000.0000 [IU]/h | INTRAVENOUS | Status: DC
  Administered 2020-11-24: 1000 [IU]/h via INTRAVENOUS
  Filled 2020-11-24: qty 250

## 2020-11-24 MED ORDER — IPRATROPIUM-ALBUTEROL 0.5-2.5 (3) MG/3ML IN SOLN
3.0000 mL | Freq: Three times a day (TID) | RESPIRATORY_TRACT | Status: DC
  Administered 2020-11-24 – 2020-11-25 (×2): 3 mL via RESPIRATORY_TRACT
  Filled 2020-11-24: qty 3

## 2020-11-24 NOTE — Progress Notes (Signed)
eLink Physician-Brief Progress Note Patient Name: LATITIA HOUSEWRIGHT DOB: Aug 30, 1960 MRN: [id number]   Date of Service  11/24/2020  HPI/Events of Note  Phos 1.9 with K+ 4.1,  Mag 1.6 , Calcium 6.9 with Albumin 2.3, on vent with Triple lumen HD cath and PIV's as well as OG         Phos infusion continues from earlier order  eICU Interventions  Corrected calcium 8.3 Creatinine 1.3 improving Reordered calcium gluconate 1 g and magnesium sulfate 2 g     Intervention Category Intermediate Interventions: Electrolyte abnormality - evaluation and management  Darl Pikes 11/24/2020, 6:12 AM

## 2020-11-24 NOTE — Progress Notes (Signed)
ANTICOAGULATION CONSULT NOTE - Initial Consult  Pharmacy Consult for Heparin Indication: chest pain/ACS  Allergies  Allergen Reactions   Aspirin    Aspirin Buffered Other (See Comments)    unspecified   Sertraline Other (See Comments)    Face swelled up.   Sumatriptan Other (See Comments)    In a fog.  Like a zombie.    Patient Measurements: Height: 5\' 4"  (162.6 cm) Weight: 72.7 kg (160 lb 4.4 oz) IBW/kg (Calculated) : 54.7 Heparin Dosing Weight: 69 kg  Vital Signs: Temp: 99.7 F (37.6 C) (10/02 0000) BP: 119/48 (10/02 0000) Pulse Rate: 56 (10/02 0000)  Labs: Recent Labs    Dec 14, 2020 1610 Dec 14, 2020 1614 12-14-20 2020 2020/12/14 2340 11/23/20 0157 11/23/20 0519 11/23/20 0728 11/23/20 0758 11/23/20 0810  HGB 15.3*   < >  --    < > 14.6 15.0 14.3  --   --   HCT 48.6*   < >  --    < > 43.1 44.0 42.0  --   --   PLT 149*  --   --   --  127*  --   --   --   --   CREATININE 1.75*   < > 1.35*  --  1.89*  --   --   --  1.77*  CKTOTAL  --   --  574*  --   --   --   --   --   --   TROPONINIHS 588*  --  1,684*  --   --   --   --  9,756*  --    < > = values in this interval not displayed.    Estimated Creatinine Clearance: 32.2 mL/min (A) (by C-G formula based on SCr of 1.77 mg/dL (H)).   Medical History: Past Medical History:  Diagnosis Date   Diabetes (HCC)    HTN (hypertension)     Medications:  Medications Prior to Admission  Medication Sig Dispense Refill Last Dose   ALPRAZolam (XANAX) 1 MG tablet Take 1 mg by mouth 3 (three) times daily as needed for anxiety.      escitalopram (LEXAPRO) 20 MG tablet Take 20 mg by mouth daily.      HYDROcodone-acetaminophen (NORCO) 10-325 MG tablet Take 0.5-1 tablets by mouth 3 (three) times daily as needed for moderate pain.      metFORMIN (GLUCOPHAGE-XR) 500 MG 24 hr tablet Take 500 mg by mouth in the morning and at bedtime.      metoprolol succinate (TOPROL-XL) 50 MG 24 hr tablet Take 50 mg by mouth daily.      omeprazole  (PRILOSEC) 20 MG capsule Take 20 mg by mouth daily.      spironolactone-hydrochlorothiazide (ALDACTAZIDE) 25-25 MG tablet Take 1 tablet by mouth daily.      SUMAtriptan (IMITREX) 50 MG tablet Take 50 mg by mouth Once PRN for migraine.      zolpidem (AMBIEN) 10 MG tablet Take 5-10 mg by mouth at bedtime as needed for sleep.       Assessment: 60 y.o. F presents s/p PEA arrest. Pharmacy consulted for IV heparin for ACS. SQ heparin 5000 units given 10/1 2122.  Goal of Therapy:  Heparin level 0.3-0.7 units/ml Monitor platelets by anticoagulation protocol: Yes   Plan:  D/c SQ heparin Heparin IV bolus 4000 units Heparin gtt at 1000 units/hr Will f/u heparin level in 8 hours Daily heparin level and CBC  2123, PharmD, BCPS Please see amion for complete clinical pharmacist  phone list 11/24/2020,1:04 AM

## 2020-11-24 NOTE — Progress Notes (Addendum)
ANTICOAGULATION CONSULT NOTE - Initial Consult  Pharmacy Consult for Heparin Indication: chest pain/ACS  Allergies  Allergen Reactions   Aspirin    Aspirin Buffered Other (See Comments)    unspecified   Sertraline Other (See Comments)    Face swelled up.   Sumatriptan Other (See Comments)    In a fog.  Like a zombie.    Patient Measurements: Height: 5\' 4"  (162.6 cm) Weight: 80 kg (176 lb 5.9 oz) IBW/kg (Calculated) : 54.7 Heparin Dosing Weight: 69 kg  Vital Signs: Temp: 100 F (37.8 C) (10/02 2000) BP: 119/64 (10/02 2000) Pulse Rate: 60 (10/02 2000)  Labs: Recent Labs    12/14/2020 2020 12/11/2020 2340 11/23/20 0157 11/23/20 0519 11/23/20 0728 11/23/20 0758 11/23/20 0810 11/24/20 0357 11/24/20 0806 11/24/20 2134  HGB  --    < > 14.6   < > 14.3  --   --  11.1*  --  10.5*  HCT  --    < > 43.1   < > 42.0  --   --  32.5*  --  32.9*  PLT  --   --  127*  --   --   --   --  PLATELET CLUMPS NOTED ON SMEAR, UNABLE TO ESTIMATE  --  PENDING  HEPARINUNFRC  --   --   --   --   --   --   --   --  0.50 0.26*  CREATININE 1.35*  --  1.89*  --   --   --  1.77* 1.30*  --   --   CKTOTAL 574*  --   --   --   --   --   --   --   --   --   TROPONINIHS 1,684*  --   --   --   --  05-31-1977*  --  2,818*  --   --    < > = values in this interval not displayed.     Estimated Creatinine Clearance: 45.9 mL/min (A) (by C-G formula based on SCr of 1.3 mg/dL (H)).   Medical History: Past Medical History:  Diagnosis Date   Diabetes (HCC)    HTN (hypertension)      Assessment: 60 y.o. F presents s/p PEA arrest. Pharmacy consulted for IV heparin for ACS. Troponins increased from 1680 to 9700 and are now down trending to 2818. Plan to remain on heparin gtt and intubated today and cardiology to consult.   HL 0.26 is subtherapeutic on 1000 units/hr. Per RN, heparin was held from around 13:00 to 13:45 while determining plan. Last HL after bolus was therapeutic on same rate.  No issues with infusion  or bleeding per RN.   Goal of Therapy:  Heparin level 0.3-0.7 units/ml Monitor platelets by anticoagulation protocol: Yes   Plan:  Increase heparin gtt to 1100 units/hr Monitor daily HL, CBC/plt Monitor for signs/symptoms of bleeding   68, PharmD, BCPS, BCCP Clinical Pharmacist  Please check AMION for all [facility] Pharmacy phone numbers After 10:00 PM, call Main Pharmacy [phone]

## 2020-11-24 NOTE — Progress Notes (Signed)
Assisted tele visit to patient with husband.  Mareon Robinette M, RN  

## 2020-11-24 NOTE — Progress Notes (Signed)
eLink Physician-Brief Progress Note Patient Name: CHESLEY VALLS DOB: August 07, 1960 MRN: [id number]   Date of Service  11/24/2020  HPI/Events of Note  Notified of troponin 9,756 from 1,684 from 588 Reportedly hemodynamically stable and able to wean off dobutamine  2D echo with membranous VSD vs eccentric AI, filling defects in LV cavity suspicious for hypertrophied papillary muscle and subchordal structures  eICU Interventions  Will start heparin drip as per ACS protocol. Pharmacy consulted  TEE recommended as per echo report, this finding is discussed with bedside RN who will notify bedside Mds in am given there's plans of extubation in the am. Might want to hold off extubation if TEE is indeed indicated.     Intervention Category Major Interventions: Other:  Darl Pikes 11/24/2020, 1:05 AM

## 2020-11-24 NOTE — Progress Notes (Signed)
ANTICOAGULATION CONSULT NOTE - Initial Consult  Pharmacy Consult for Heparin Indication: chest pain/ACS  Allergies  Allergen Reactions   Aspirin    Aspirin Buffered Other (See Comments)    unspecified   Sertraline Other (See Comments)    Face swelled up.   Sumatriptan Other (See Comments)    In a fog.  Like a zombie.    Patient Measurements: Height: 5\' 4"  (162.6 cm) Weight: 80 kg (176 lb 5.9 oz) IBW/kg (Calculated) : 54.7 Heparin Dosing Weight: 69 kg  Vital Signs: Temp: 100.2 F (37.9 C) (10/02 0600) BP: 120/49 (10/02 0600) Pulse Rate: 58 (10/02 0600)  Labs: Recent Labs    11/07/2020 1610 11/10/2020 1614 10/29/2020 2020 11/02/2020 2340 11/23/20 0157 11/23/20 0519 11/23/20 0728 11/23/20 0758 11/23/20 0810 11/24/20 0357 11/24/20 0806  HGB 15.3*   < >  --    < > 14.6 15.0 14.3  --   --  11.1*  --   HCT 48.6*   < >  --    < > 43.1 44.0 42.0  --   --  32.5*  --   PLT 149*  --   --   --  127*  --   --   --   --  PLATELET CLUMPS NOTED ON SMEAR, UNABLE TO ESTIMATE  --   HEPARINUNFRC  --   --   --   --   --   --   --   --   --   --  0.50  CREATININE 1.75*   < > 1.35*  --  1.89*  --   --   --  1.77* 1.30*  --   CKTOTAL  --   --  574*  --   --   --   --   --   --   --   --   TROPONINIHS 588*  --  1,684*  --   --   --   --  05-31-1979*  --  2,818*  --    < > = values in this interval not displayed.     Estimated Creatinine Clearance: 45.9 mL/min (A) (by C-G formula based on SCr of 1.3 mg/dL (H)).   Medical History: Past Medical History:  Diagnosis Date   Diabetes (HCC)    HTN (hypertension)     Medications:  Medications Prior to Admission  Medication Sig Dispense Refill Last Dose   ALPRAZolam (XANAX) 1 MG tablet Take 1 mg by mouth 3 (three) times daily as needed for anxiety.      escitalopram (LEXAPRO) 20 MG tablet Take 20 mg by mouth daily.      HYDROcodone-acetaminophen (NORCO) 10-325 MG tablet Take 0.5-1 tablets by mouth 3 (three) times daily as needed for moderate pain.       metFORMIN (GLUCOPHAGE-XR) 500 MG 24 hr tablet Take 500 mg by mouth in the morning and at bedtime.      metoprolol succinate (TOPROL-XL) 50 MG 24 hr tablet Take 50 mg by mouth daily.      omeprazole (PRILOSEC) 20 MG capsule Take 20 mg by mouth daily.      spironolactone-hydrochlorothiazide (ALDACTAZIDE) 25-25 MG tablet Take 1 tablet by mouth daily.      SUMAtriptan (IMITREX) 50 MG tablet Take 50 mg by mouth Once PRN for migraine.      zolpidem (AMBIEN) 10 MG tablet Take 5-10 mg by mouth at bedtime as needed for sleep.       Assessment: 60 y.o. F presents s/p PEA  arrest. Pharmacy consulted for IV heparin for ACS. Troponins increased from 1680 to 9700 and are now down trending to 2818. Plan to remain on heparin gtt and intubated today and cardiology to consult.   Patient given 5000 unit bolus and started on drip at 1000 units/hr. Heparin level of 0.5 therapeutic on drip at 1000 units/hr. Hemoglobin dropped from 14.2 to 11.1 and platlets hemolyzed in this mornings CBC. Discussed with RN and no signs of bleeding noted. Will monitor.  Goal of Therapy:  Heparin level 0.3-0.7 units/ml Monitor platelets by anticoagulation protocol: Yes   Plan:  Continue heparin gtt at 1000 units/hr Will f/u heparin level in 8 hours Daily heparin level and CBC  Drake Leach, PharmD PGY2 Cardiology Pharmacy Resident Phone: [phone] 11/24/2020  10:02 AM  Please check [email or web address] for unit-specific pharmacy phone numbers.

## 2020-11-24 NOTE — Progress Notes (Signed)
NAME:  Samantha Cooley, MRN:  [id number], DOB:  [birth year], LOS: 2 ADMISSION DATE:  11/08/2020, CONSULTATION DATE:  9/30  REFERRING MD:  Horton, CHIEF COMPLAINT:  cardiac arrest    History of Present Illness:  60 year old white female with history as per below.  Presented to the emergency room on 9/30 status post PEA arrest.  History obtained in the emergency room: Was in normal state of health, had been on phone with her significant other around 2 PM, later at 2:50 PM found unresponsive and pulseless by significant other.  EMS was called, and significant other initiated CPR.  EMS arrival noted to be around 2:55 PM.  Initial rhythm PEA.  Return of spontaneous circulation estimated at approximately 15 minutes.  On arrival to the emergency room intubated, requiring high FiO2, initially hypertensive, improved with fentanyl.  Would open eyes, not appropriately, stick her tongue out, had equal muscle tone bilaterally and would lift arms spontaneously but could not follow commands with lower extremities. Preliminary evaluation in the ER: Glucose 372 White blood cell count 18.3 creatinine 1.75 anion gap metabolic acidosis with serum bicarbonate of 11, lactate of greater than 9 peak troponin of 588.   -Bedside ultrasound was negative for pericardial effusion RV function normal, LV function difficult to assess.  Seen by cardiology, did not think this was consistent with STEMI Pertinent  Medical History  HTN, diabetes, anxiety, depression, insomnia.  Significant Hospital Events: Including procedures, antibiotic start and stop dates in addition to other pertinent events   9/30 admitted s/p arrest, intubated in field, seen by cardiology and felt not primary ST elevation event.  Suspected more metabolically driven.  On presentation had severe anion gap metabolic acidosis, hyperglycemia, and encephalopathy but was following some simple commands such as sticking tongue out and nodding appropriately. Vanc/zosyn started.  CT head neck and chest ordered.   Interim History / Subjective:  Weaning vasopressor support overnight.  Patient will awaken and follow commands.   Objective   Blood pressure (!) 112/47, pulse 65, temperature (!) 100.8 F (38.2 C), resp. rate (!) 32, height 5\' 4"  (1.626 m), weight 80 kg, SpO2 93 %.    Vent Mode: PRVC FiO2 (%):  [40 %] 40 % Set Rate:  [32 bmp] 32 bmp Vt Set:  [440 mL] 440 mL PEEP:  [8 cmH20] 8 cmH20 Plateau Pressure:  [21 cmH20-24 cmH20] 24 cmH20   Intake/Output Summary (Last 24 hours) at 11/24/2020 1122 Last data filed at 11/24/2020 1057 Gross per 24 hour  Intake 3203.24 ml  Output 1475 ml  Net 1728.24 ml    Filed Weights   11/18/2020 1645 11/23/20 0500 11/24/20 0500  Weight: 69.3 kg 72.7 kg 80 kg    Examination: General: Chronically ill-appearing 60 year old white female currently sedated on fentanyl infusion HENT: Normocephalic atraumatic orally intubated mucous membranes are moist pupils reactive Lungs: Marginal oxygenation with diffuse rhonchi  Cardiovascular: Regular rate and rhythm without murmur rub or gallop Abdomen: Soft not tender Extremities: Warm extremities Neuro: Eyes open.  Tracks to voice.  Follows commands. GU: Clear urine in Foley catheter  Point-of-care echocardiogram shows normal RV function.  LV moderately depressed with possible stress cardiomyopathy pattern.  IVC greater than 2 cm with low distensibility index  Resolved Hospital Problem list     Assessment & Plan:   Critically ill due to PEA cardiac arrest with acute hypoxic respiratory failure and aspiration pneumonia requiring mechanical ventilation. Critically ill due to undifferentiated shock likely distributive following cardiac arrest.  Possible component  of stress cardiomyopathy causing cardiogenic shock, requiring titration of norepinephrine and dobutamine Hypoxic ischemic encephalopathy appears to be resolving. Stress hyperglycemia beta hydroxybutyrate negative AKI.   Suspect secondary to volume depletion and organ hypoperfusion  Continues to improve. Weaning NE. However, marginal oxygenation limits further weaning at this point. Cause of arrest still unclear: medication misuse or cardiac event.  Plan:  -Bronchodilators, diuresis and chest PT. Consider steroids for treatment of COPD exacerbation.  -Titrate norepinephrine to keep MAP greater than 65. -Follow-up formal echocardiogram results with cardiology consultation in am. Continue Tx as ACS -Stop antibiotics as cultures are negative.  -Start home anxiolytics.   Best Practice (right click and "Reselect all SmartList Selections" daily)   Diet/type: NPO DVT prophylaxis: SCD GI prophylaxis: PPI Lines: N/A Foley:  Yes, and it is still needed Code Status:  full code Last date of multidisciplinary goals of care discussion [pending]  Labs   CBC: Recent Labs  Lab 11/21/2020 1610 11/02/2020 1614 11/23/20 0101 11/23/20 0157 11/23/20 0519 11/23/20 0728 11/24/20 0357  WBC 18.3*  --   --  18.4*  --   --  9.5  NEUTROABS 11.4*  --   --   --   --   --  7.4  HGB 15.3*   < > 14.3 14.6 15.0 14.3 11.1*  HCT 48.6*   < > 42.0 43.1 44.0 42.0 32.5*  MCV 105.9*  --   --  99.1  --   --  99.4  PLT 149*  --   --  127*  --   --  PLATELET CLUMPS NOTED ON SMEAR, UNABLE TO ESTIMATE   < > = values in this interval not displayed.     Basic Metabolic Panel: Recent Labs  Lab 10/24/2020 1610 10/28/2020 1614 11/18/2020 1900 11/10/2020 2020 11/20/2020 2340 11/23/20 0157 11/23/20 0519 11/23/20 0728 11/23/20 0810 11/23/20 1700 11/24/20 0357  NA 131*   < > 130* 131*   < > 132* 134* 132* 131*  --  132*  K 3.6   < > 5.5* 5.0   < > 3.6 3.5 3.3* 3.3*  --  4.1  CL 95*   < > 99 100  --  100  --   --  101  --  106  CO2 11*  --  15* 13*  --  21*  --   --  17*  --  16*  GLUCOSE 422*   < > 242* 214*  --  196*  --   --  223*  --  178*  BUN 17   < > 19 20  --  24*  --   --  25*  --  21  CREATININE 1.75*   < > 1.35* 1.35*  --  1.89*   --   --  1.77*  --  1.30*  CALCIUM 8.2*  --  8.2* 8.2*  --  6.8*  --   --  6.9*  --  6.9*  MG 2.3  --   --  1.9  --   --   --   --  1.1* 2.1 1.6*  PHOS  --   --   --  7.6*  --   --   --   --  1.9* 1.4* 1.9*   < > = values in this interval not displayed.    GFR: Estimated Creatinine Clearance: 45.9 mL/min (A) (by C-G formula based on SCr of 1.3 mg/dL (H)). Recent Labs  Lab 11/04/2020 1610 11/02/2020 1810 11/11/2020 2020 11/23/20  0157 11/24/20 0357  PROCALCITON  --   --  2.11 13.62  --   WBC 18.3*  --   --  18.4* 9.5  LATICACIDVEN >9.0* 6.3*  --   --   --      Liver Function Tests: Recent Labs  Lab 11/05/2020 1610 11/23/20 0157 11/24/20 0357  AST 106* 190* 51*  ALT 61* 91* 48*  ALKPHOS 148* 126 70  BILITOT 0.8 1.0 0.7  PROT 5.3* 4.8* 4.3*  ALBUMIN 2.9* 2.7* 2.3*    No results for input(s): LIPASE, AMYLASE in the last 168 hours. No results for input(s): AMMONIA in the last 168 hours.  ABG    Component Value Date/Time   PHART 7.290 (L) 11/23/2020 0728   PCO2ART 40.0 11/23/2020 0728   PO2ART 60 (L) 11/23/2020 0728   HCO3 19.0 (L) 11/23/2020 0728   TCO2 20 (L) 11/23/2020 0728   ACIDBASEDEF 7.0 (H) 11/23/2020 0728   O2SAT 50.6 11/23/2020 1010      Coagulation Profile: No results for input(s): INR, PROTIME in the last 168 hours.  Cardiac Enzymes: Recent Labs  Lab 11/02/2020 2020  CKTOTAL 574*     HbA1C: Hgb A1c MFr Bld  Date/Time Value Ref Range Status  11/23/2020 02:44 AM 6.5 (H) 4.8 - 5.6 % Final    Comment:    (NOTE) Pre diabetes:          5.7%-6.4%  Diabetes:              >6.4%  Glycemic control for   <7.0% adults with diabetes     CBG: Recent Labs  Lab 11/23/20 1630 11/23/20 2104 11/24/20 0042 11/24/20 0433 11/24/20 0809  GLUCAP 194* 172* 177* 184* 180*   CRITICAL CARE Performed by: Lynnell Catalan   Total critical care time: 40 minutes  Critical care time was exclusive of separately billable procedures and treating other  patients.  Critical care was necessary to treat or prevent imminent or life-threatening deterioration.  Critical care was time spent personally by me on the following activities: development of treatment plan with patient and/or surrogate as well as nursing, discussions with consultants, evaluation of patient's response to treatment, examination of patient, obtaining history from patient or surrogate, ordering and performing treatments and interventions, ordering and review of laboratory studies, ordering and review of radiographic studies, pulse oximetry, re-evaluation of patient's condition and participation in multidisciplinary rounds.  Lynnell Catalan, MD [facility] ICU Physician [facility]  Pager: [phone] Mobile: [phone] After hours: [phone].

## 2020-11-24 NOTE — Progress Notes (Signed)
Initial Nutrition Assessment  DOCUMENTATION CODES:   Obesity unspecified  INTERVENTION:   -D/c Vital 1.5  Initiate Vital High Protein @ 40 ml/hr via OGT (960 ml daily)  45 ml Prosource TID.    Tube feeding regimen provides 1080kcal (100% of needs), 117 grams of protein, and 803 ml of H2O.    NUTRITION DIAGNOSIS:   Inadequate oral intake related to inability to eat as evidenced by NPO status.  GOAL:   Provide needs based on ASPEN/SCCM guidelines  MONITOR:   Vent status, Labs, Weight trends, TF tolerance, Skin, I & O's  REASON FOR ASSESSMENT:   Consult Enteral/tube feeding initiation and management  ASSESSMENT:   60 year old white female with history as per below.  Presented to the emergency room on 9/30 status post PEA arrest.  History obtained in the emergency room: Was in normal state of health, had been on phone with her significant other around 2 PM, later at 2:50 PM found unresponsive and pulseless by significant other.  EMS was called, and significant other initiated CPR.  EMS arrival noted to be around 2:55 PM.  Initial rhythm PEA.  Return of spontaneous circulation estimated at approximately 15 minutes.  On arrival to the emergency room intubated, requiring high FiO2, initially hypertensive, improved with fentanyl.  Would open eyes, not appropriately, stick her tongue out, had equal muscle tone bilaterally and would lift arms spontaneously but could not follow commands with lower extremities.  Pt admitted s/p PEA arrest.  Patient is currently intubated on ventilator support MV: 13.1 L/min Temp (24hrs), Avg:100.1 F (37.8 C), Min:99.7 F (37.6 C), Max:100.8 F (38.2 C)  Reviewed I/O's: +3.9 L x 24 hours and +5.1 L since admission  UOP: 1.1 L x 24 hours  MAP: 69   Reviewed wt hx; no recent wt hx available to assess. No distant history of weight loss.   Medications reviewed and include colace, miralax, levophed, and 0.9% sodium chloride infusion @ 75 ml/hr.    Lab Results  Component Value Date   HGBA1C 6.5 (H) 11/23/2020   PTA DM medications are .   Labs reviewed: CBGS: 140-180 (inpatient orders for glycemic control are 2-6 units insulin aspart every 4 hours and 12 units insulin detemir every 12 hours).    Diet Order:   Diet Order     None       EDUCATION NEEDS:   No education needs have been identified at this time  Skin:  Skin Assessment: Reviewed RN Assessment  Last BM:  Unknown  Height:   Ht Readings from Last 1 Encounters:  11/12/2020 5\' 4"  (1.626 m)    Weight:   Wt Readings from Last 1 Encounters:  11/24/20 80 kg    Ideal Body Weight:  54.5 kg  BMI:  Body mass index is 30.27 kg/m.  Estimated Nutritional Needs:   Kcal:  [phone]  Protein:  > 109 grams  Fluid:  > 1 L    01/24/21, RD, LDN, CDCES Registered Dietitian II Certified Diabetes Care and Education Specialist Please refer to [facility] for RD and/or RD on-call/weekend/after hours pager

## 2020-11-25 ENCOUNTER — Inpatient Hospital Stay (HOSPITAL_COMMUNITY): Payer: Self-pay

## 2020-11-25 DIAGNOSIS — I6389 Other cerebral infarction: Secondary | ICD-10-CM

## 2020-11-25 DIAGNOSIS — J9690 Respiratory failure, unspecified, unspecified whether with hypoxia or hypercapnia: Secondary | ICD-10-CM

## 2020-11-25 LAB — LIPID PANEL
Cholesterol: 139 mg/dL (ref 0–200)
HDL: 59 mg/dL (ref >40)
LDL Cholesterol: 58 mg/dL (ref 0–99)
Total CHOL/HDL Ratio: 2.4 RATIO
Triglycerides: 111 mg/dL (ref <150)
VLDL: 22 mg/dL (ref 0–40)

## 2020-11-25 LAB — CBC WITH DIFFERENTIAL/PLATELET
Abs Immature Granulocytes: 0.05 10*3/uL (ref 0.00–0.07)
Basophils Absolute: 0 10*3/uL (ref 0.0–0.1)
Basophils Relative: 0 %
Eosinophils Absolute: 0.1 10*3/uL (ref 0.0–0.5)
Eosinophils Relative: 1 %
HCT: 30 % — ABNORMAL LOW (ref 36.0–46.0)
Hemoglobin: 9.9 g/dL — ABNORMAL LOW (ref 12.0–15.0)
Immature Granulocytes: 1 %
Lymphocytes Relative: 14 %
Lymphs Abs: 1.1 10*3/uL (ref 0.7–4.0)
MCH: 33.3 pg (ref 26.0–34.0)
MCHC: 33 g/dL (ref 30.0–36.0)
MCV: 101 fL — ABNORMAL HIGH (ref 80.0–100.0)
Monocytes Absolute: 0.3 10*3/uL (ref 0.1–1.0)
Monocytes Relative: 4 %
Neutro Abs: 6 10*3/uL (ref 1.7–7.7)
Neutrophils Relative %: 80 %
Platelets: 61 10*3/uL — ABNORMAL LOW (ref 150–400)
RBC: 2.97 MIL/uL — ABNORMAL LOW (ref 3.87–5.11)
RDW: 13.8 % (ref 11.5–15.5)
WBC: 7.4 10*3/uL (ref 4.0–10.5)
nRBC: 0 % (ref 0.0–0.2)

## 2020-11-25 LAB — COMPREHENSIVE METABOLIC PANEL
ALT: 33 U/L (ref 0–44)
AST: 31 U/L (ref 15–41)
Albumin: 2.4 g/dL — ABNORMAL LOW (ref 3.5–5.0)
Alkaline Phosphatase: 81 U/L (ref 38–126)
Anion gap: 9 (ref 5–15)
BUN: 18 mg/dL (ref 8–23)
CO2: 19 mmol/L — ABNORMAL LOW (ref 22–32)
Calcium: 7.4 mg/dL — ABNORMAL LOW (ref 8.9–10.3)
Chloride: 104 mmol/L (ref 98–111)
Creatinine, Ser: 1.12 mg/dL — ABNORMAL HIGH (ref 0.44–1.00)
GFR, Estimated: 56 mL/min — ABNORMAL LOW (ref >60)
Glucose, Bld: 184 mg/dL — ABNORMAL HIGH (ref 70–99)
Potassium: 3.9 mmol/L (ref 3.5–5.1)
Sodium: 132 mmol/L — ABNORMAL LOW (ref 135–145)
Total Bilirubin: 0.7 mg/dL (ref 0.3–1.2)
Total Protein: 4.8 g/dL — ABNORMAL LOW (ref 6.5–8.1)

## 2020-11-25 LAB — HEPARIN LEVEL (UNFRACTIONATED): Heparin Unfractionated: 0.13 IU/mL — ABNORMAL LOW (ref 0.30–0.70)

## 2020-11-25 LAB — GLUCOSE, CAPILLARY
Glucose-Capillary: 169 mg/dL — ABNORMAL HIGH (ref 70–99)
Glucose-Capillary: 138 mg/dL — ABNORMAL HIGH (ref 70–99)
Glucose-Capillary: 116 mg/dL — ABNORMAL HIGH (ref 70–99)
Glucose-Capillary: 158 mg/dL — ABNORMAL HIGH (ref 70–99)
Glucose-Capillary: 86 mg/dL (ref 70–99)

## 2020-11-25 IMAGING — MR MR HEAD W/O CM
13 of 14 series · 44 of 48 positions shown · IV contrast (gadavist)
Comparison: And prior MRI, correlation is made with CT head
[DATE]

CLINICAL DATA: Neuro deficit, stroke suspected

EXAM:
MRI HEAD WITHOUT CONTRAST
MRA HEAD WITHOUT CONTRAST
MRA NECK WITHOUT AND WITH CONTRAST
TECHNIQUE: Multiplanar, multi-echo pulse sequences of the brain and surrounding
structures were acquired without intravenous contrast. Angiographic
images of the Circle of Willis were acquired using MRA technique
without intravenous contrast. Angiographic images of the neck were
acquired using MRA technique without and with intravenous contrast.
Carotid stenosis measurements (when applicable) are obtained
utilizing NASCET criteria, using the distal internal carotid
diameter as the denominator.
CONTRAST:  8mL GADAVIST GADOBUTROL 1 MMOL/ML IV SOLN

[Series 5: DWI · axial · 3.0mm · 0.88mm/px · z∈[-36,+113]mm · 7 of 104 slices shown (1 of 4)]
[im 1/104]
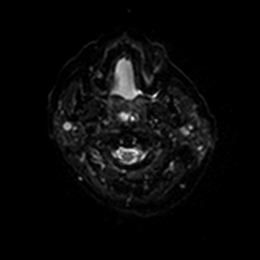
[im 18/104]
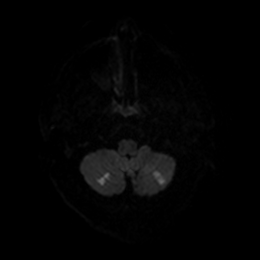
[im 35/104]
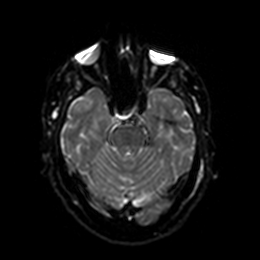
[im 52/104]
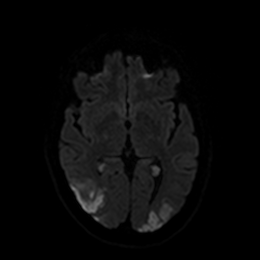
[im 69/104]
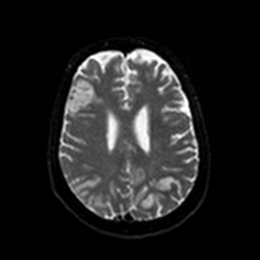
[im 86/104]
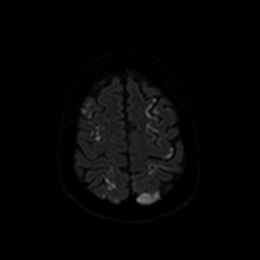
[im 104/104]
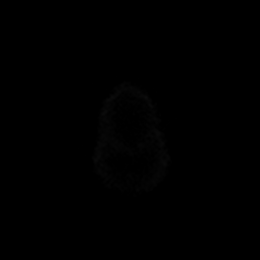

[Series 6: DWI · axial · 3.0mm · 0.88mm/px · z∈[-36,+113]mm · 4 of 52 slices shown (2 of 4)]
[im 1/52]
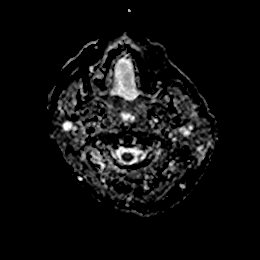
[im 18/52]
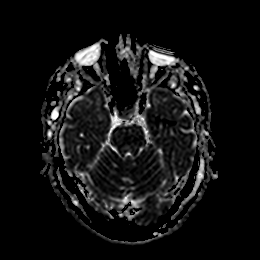
[im 35/52]
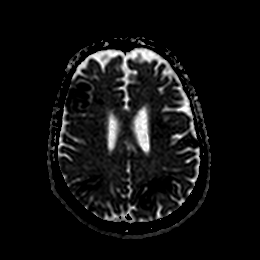
[im 52/52]
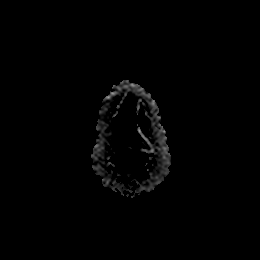

[Series 7: DWI · coronal · 4.0mm · 0.88mm/px · 5 of 72 slices shown (3 of 4)]
[im 1/72]
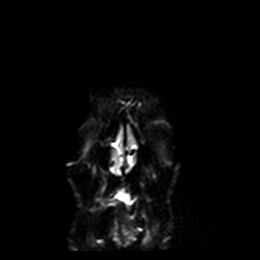
[im 18/72]
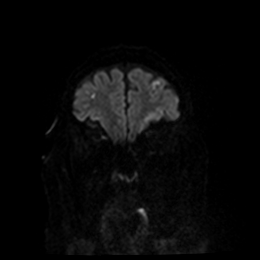
[im 36/72]
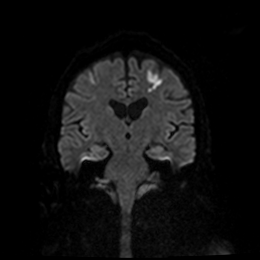
[im 54/72]
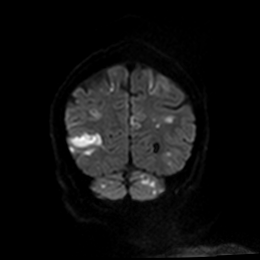
[im 72/72]
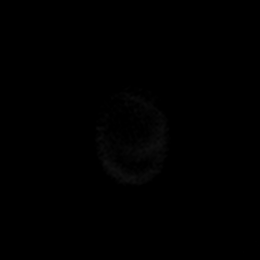

[Series 8: DWI · coronal · 4.0mm · 0.88mm/px · 3 of 36 slices shown (4 of 4)]
[im 1/36]
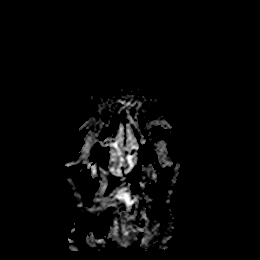
[im 18/36]
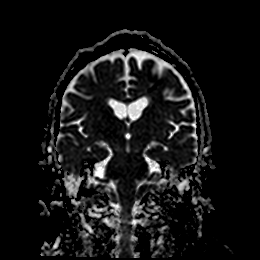
[im 36/36]
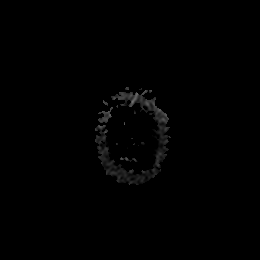

[Series 9: T1 · sagittal · 5.0mm · 0.75mm/px · 2 of 23 slices shown]
[im 1/23]
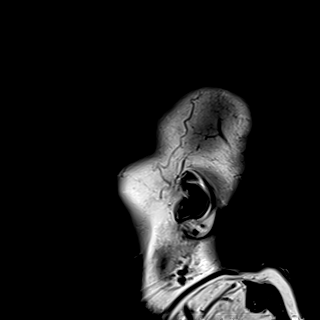
[im 23/23]
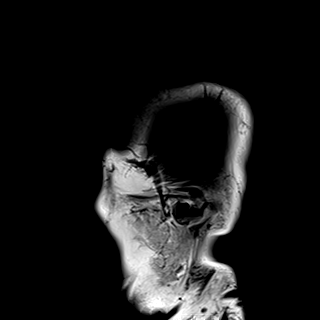

[Series 10: T2 · axial · 5.0mm · 0.72mm/px · 1 of 13 slices shown (1 of 3)]
[im 1/13]
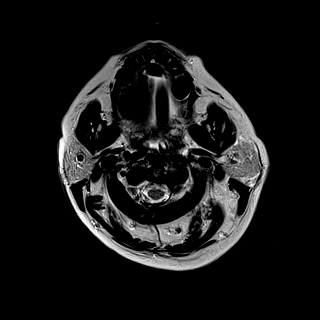

[Series 11: T2 · axial · 5.0mm · 0.72mm/px · z∈[-42,+98]mm · 2 of 25 slices shown (2 of 3)]
[im 1/25]
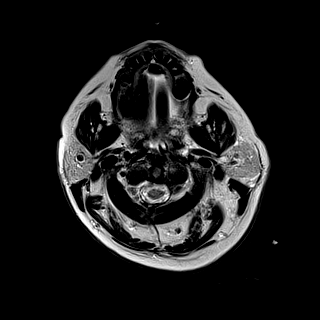
[im 25/25]
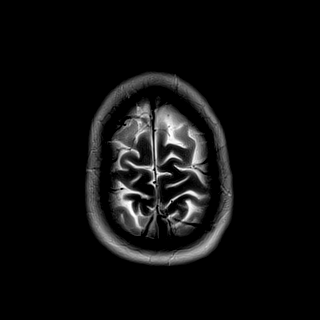

[Series 12: FLAIR · axial · 5.0mm · 0.45mm/px · z∈[-47,+93]mm · 2 of 25 slices shown]
[im 1/25]
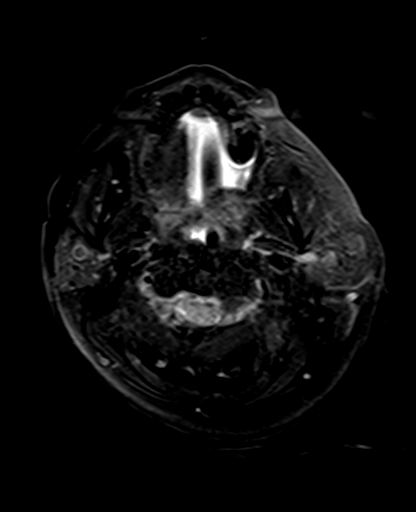
[im 25/25]
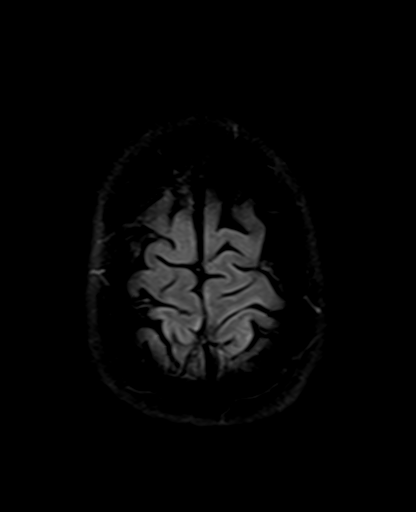

[Series 13: mag_images · axial · 3.0mm · 0.90mm/px · z∈[-49,+123]mm · 4 of 60 slices shown]
[im 1/60]
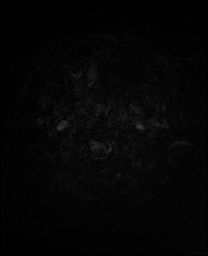
[im 20/60]
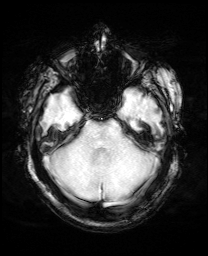
[im 40/60]
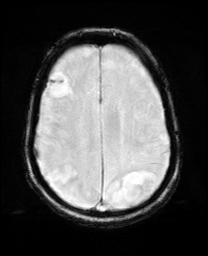
[im 60/60]
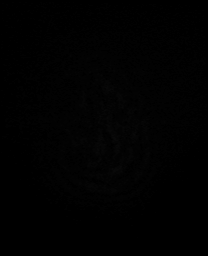

[Series 14: pha_images · axial · 3.0mm · 0.90mm/px · z∈[-46,+117]mm · 4 of 57 slices shown]
[im 1/57]
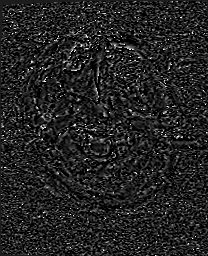
[im 19/57]
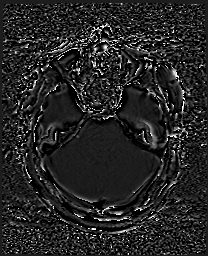
[im 38/57]
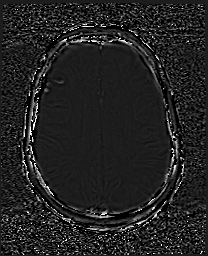
[im 57/57]
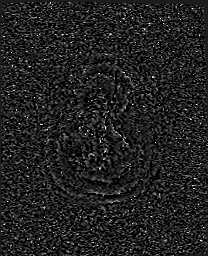

[Series 15: swi_images · axial · 3.0mm · 0.90mm/px · z∈[-49,+123]mm · 4 of 60 slices shown]
[im 1/60]
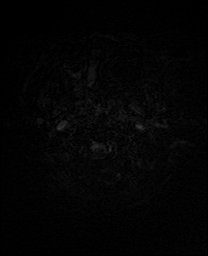
[im 20/60]
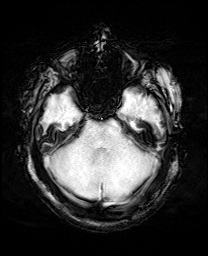
[im 40/60]
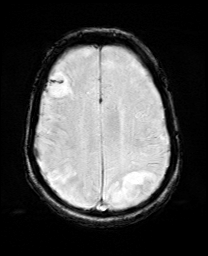
[im 60/60]
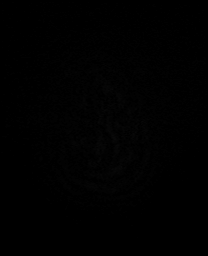

[Series 16: mip_images(sw) · axial · 24.0mm · 0.90mm/px · z∈[-39,+113]mm · 4 of 53 slices shown]
[im 1/53]
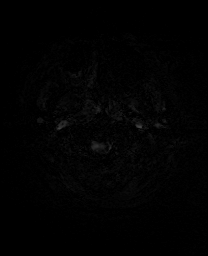
[im 18/53]
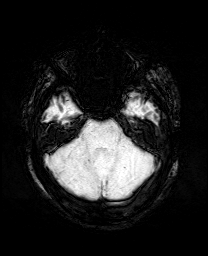
[im 35/53]
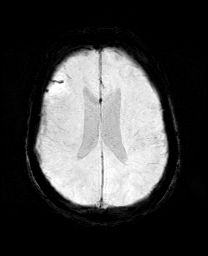
[im 53/53]
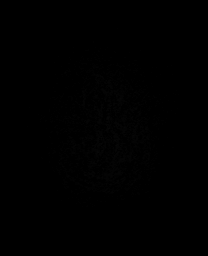

[Series 18: T2 · coronal · 5.0mm · 0.72mm/px · 2 of 28 slices shown (3 of 3)]
[im 1/28]
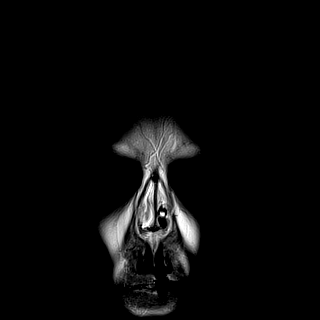
[im 28/28]
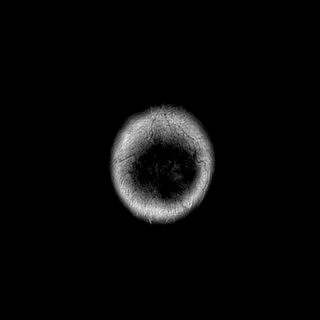

[44 of 48 positions shown; findings below may reference images not displayed]

FINDINGS: MRI HEAD FINDINGS

Brain: Areas of restricted diffusion in the cortex and white matter
bilateral frontal, parietal, occipital, posterior temporal lobes, as
well as the bilateral cerebellar hemispheres. Additional punctate
focus of restricted diffusion in the left caudate head. Some of this
is in a watershed distribution (series 5, image 94), while other
areas are more focal. The cortical areas are associated with gyral
swelling (series 12, images 15 and 20, for example). Areas of
curvilinear susceptibility in the right frontal lobe in the area of
infarct (series 15, image 36), likely hemorrhage. No extra-axial
fluid collection. No mass, mass effect, or midline shift. Remote
left thalamic lacunar infarct.

Vascular: See MRA, below.

Skull and upper cervical spine: Normal marrow signal.

Sinuses/Orbits: Air-fluid levels in the right greater than left
maxillary sinus, with trace fluid in the left sphenoid sinus. NG
tube seen in the nasal cavity. Partially visualized endotracheal
tube in the oral cavity. The orbits are unremarkable.

Other: Fluid in the bilateral mastoid air cells.

MRA HEAD FINDINGS

Anterior circulation: Both internal carotid arteries are patent to
the termini, without stenosis or other abnormality; mildly decreased
signal in the proximal cavernous carotid is felt to be artifactual.
A1 segments patent. Anterior cerebral arteries are patent to their
distal aspects. No significant M1 stenosis or occlusion. Normal MCA
bifurcations. Distal MCA branches well perfused and symmetric.

Posterior circulation: Vertebral arteries patent to the
vertebrobasilar junction without stenosis. Focal area of poor flow
visualization in the basilar artery (series 5, image 73), which may
be artifactual. The basilar is otherwise patent to its distal
aspect. Superior cerebral arteries patent bilaterally. PCAs well
perfused to their distal aspects without stenosis.Posterior
communicating arteries are visualized bilaterally.

Anatomic variants: None significant.

MRA NECK FINDINGS

Aortic arch: Normal three-vessel arch. No significant stenosis or
aneurysm.

Right carotid system: Patent, without hemodynamically significant
stenosis, occlusion, or aneurysm. Plaque at the bifurcation.

Left carotid system: Patent, without hemodynamically significant
stenosis, occlusion, or aneurysm. Plaque at the bifurcation.

Vertebral arteries: Limited visualization of the origin of the right
vertebral artery secondary to artifact. The vertebral arteries are
otherwise patent, without significant stenosis, occlusion, or
aneurysm.

Other: None
IMPRESSION: 1. Multiple areas of restricted diffusion throughout the bilateral
cerebral and cerebellar hemispheres, some of which are in a
watershed pattern, while others are more likely to be embolic, given
different vascular territories. Likely petechial hemorrhage in a
right frontal lobe cortical infarct, but no evidence of significant
hemorrhagic transformation or midline shift.
2. No large vessel occlusion in the intracranial vasculature.
3. No hemodynamically significant stenosis in the neck.

Multiple attempts were made to contact a provider taking care of
this patient to convey these results.

## 2020-11-25 IMAGING — MR MR MRA NECK WO/W CM
6 of 7 series · 39 of 48 positions shown · IV contrast (Contrast agent)
Comparison: And prior MRI, correlation is made with CT head
[DATE]

CLINICAL DATA: Neuro deficit, stroke suspected

EXAM:
MRI HEAD WITHOUT CONTRAST
MRA HEAD WITHOUT CONTRAST
MRA NECK WITHOUT AND WITH CONTRAST
TECHNIQUE: Multiplanar, multi-echo pulse sequences of the brain and surrounding
structures were acquired without intravenous contrast. Angiographic
images of the Circle of Willis were acquired using MRA technique
without intravenous contrast. Angiographic images of the neck were
acquired using MRA technique without and with intravenous contrast.
Carotid stenosis measurements (when applicable) are obtained
utilizing NASCET criteria, using the distal internal carotid
diameter as the denominator.
CONTRAST:  8mL GADAVIST GADOBUTROL 1 MMOL/ML IV SOLN

[Series 10: tof_fl3d_tra_iso · axial · 0.6mm · 0.52mm/px · z∈[-119,-42]mm · 8 of 133 slices shown]
[im 1/133]
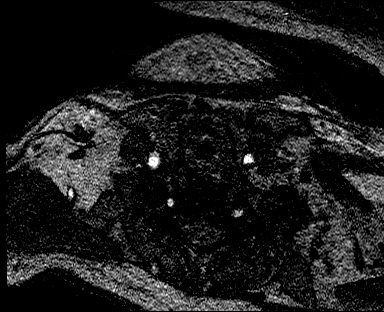
[im 19/133]
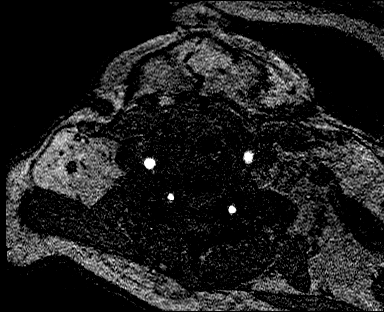
[im 38/133]
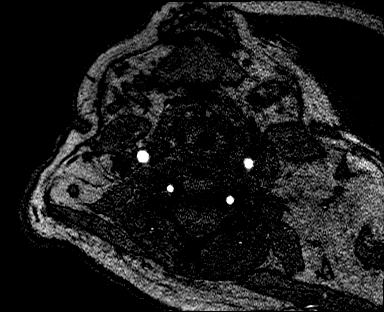
[im 57/133]
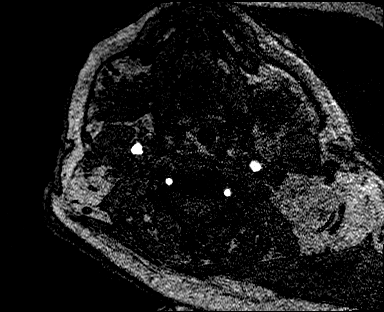
[im 76/133]
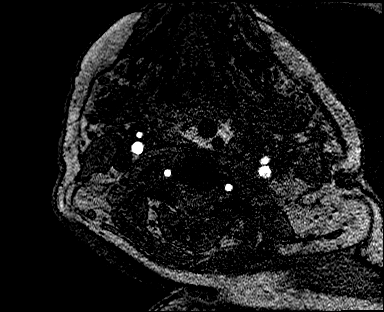
[im 95/133]
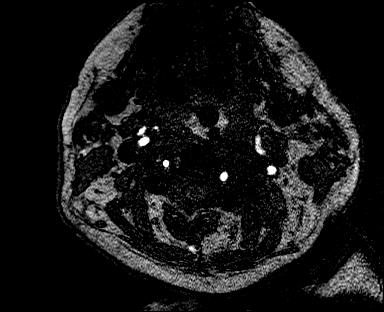
[im 114/133]
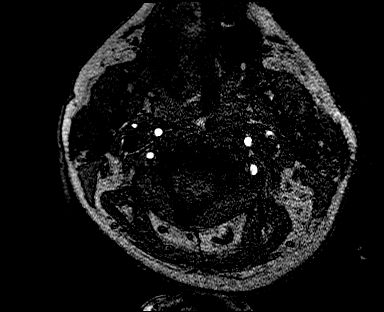
[im 133/133]
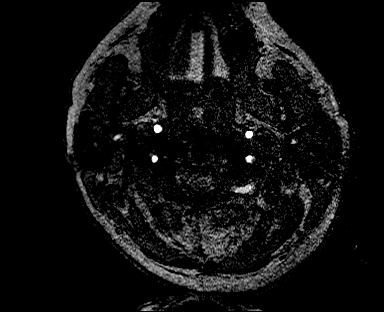

[Series 15: angio_fl3d_cor_post_ttc=3.0s · coronal · 0.9mm · 0.85mm/px · 6 of 96 slices shown (1 of 2)]
[im 1/96]
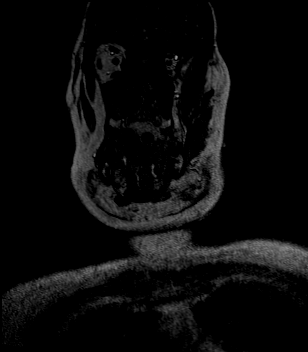
[im 20/96]
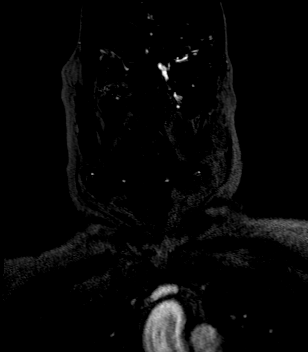
[im 39/96]
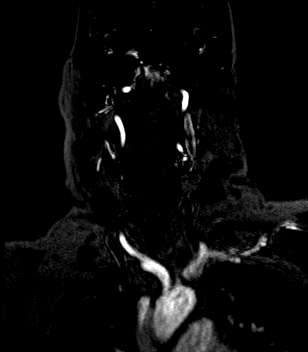
[im 58/96]
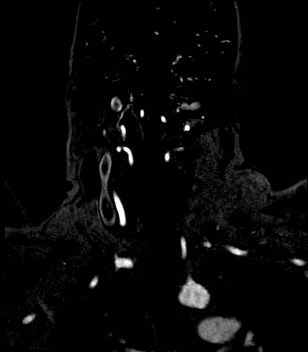
[im 77/96]
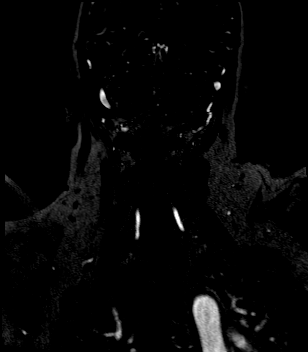
[im 96/96]
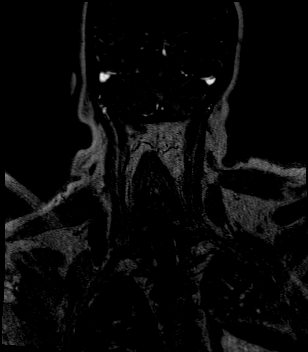

[Series 16: angio_fl3d_cor_post_ttc=3.0s_moco-adv · coronal · 0.9mm · 0.85mm/px · 7 of 96 slices shown (1 of 2)]
[im 1/96]
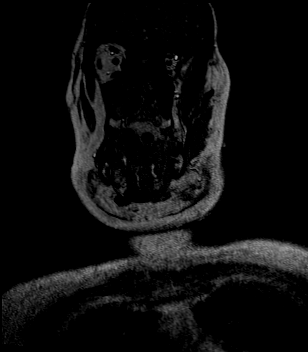
[im 16/96]
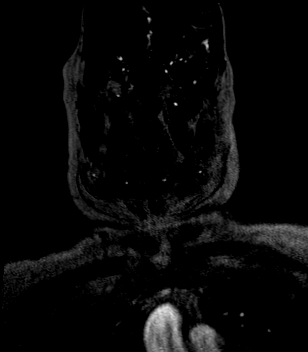
[im 32/96]
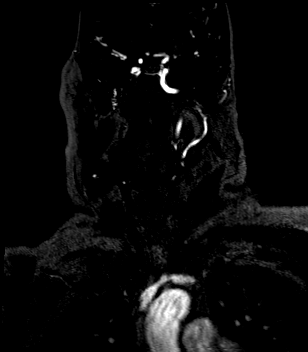
[im 48/96]
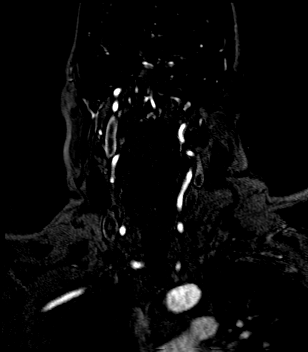
[im 64/96]
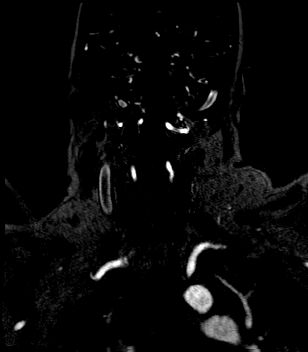
[im 80/96]
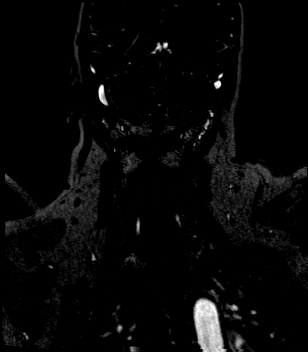
[im 96/96]
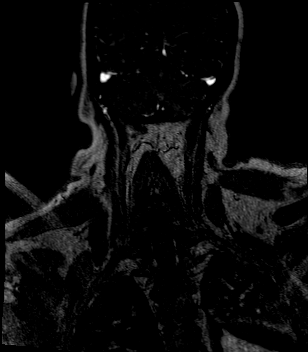

[Series 17: angio_fl3d_cor_post_ttc=3.0s_moco-adv_sub · coronal · 0.9mm · 0.85mm/px · 6 of 95 slices shown]
[im 1/95]
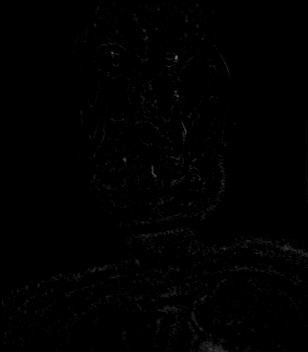
[im 19/95]
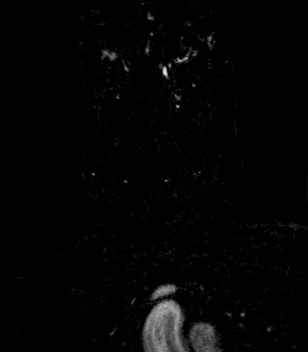
[im 38/95]
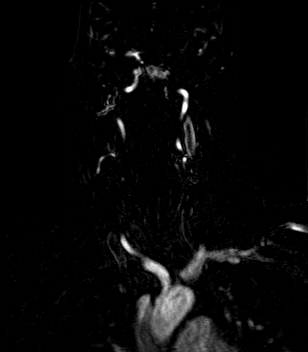
[im 57/95]
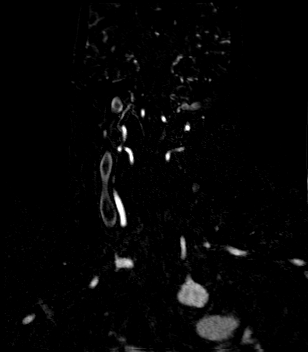
[im 76/95]
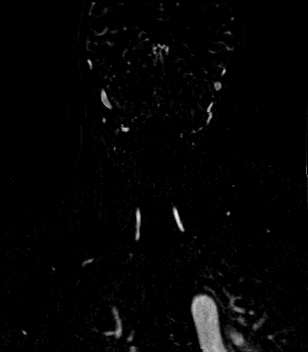
[im 95/95]
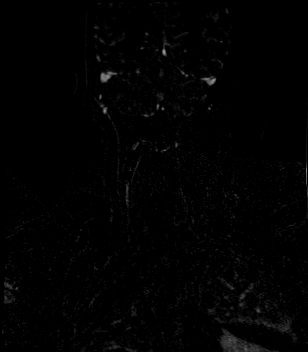

[Series 19: angio_fl3d_cor_post_ttc=3.0s · coronal · 0.9mm · 0.85mm/px · 7 of 96 slices shown (2 of 2)]
[im 1/96]
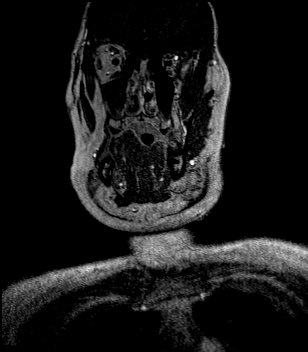
[im 16/96]
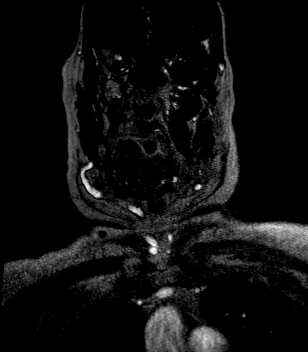
[im 32/96]
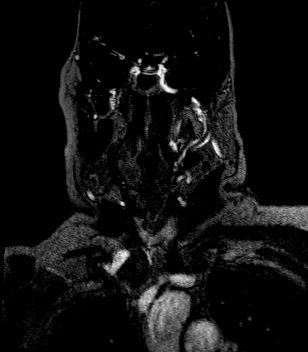
[im 48/96]
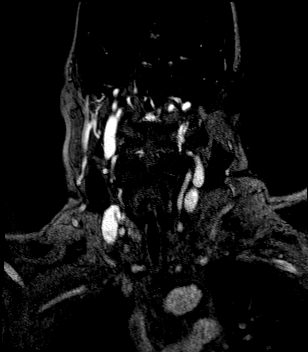
[im 64/96]
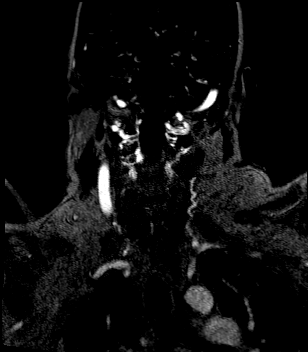
[im 80/96]
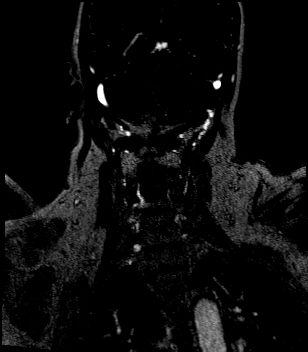
[im 96/96]
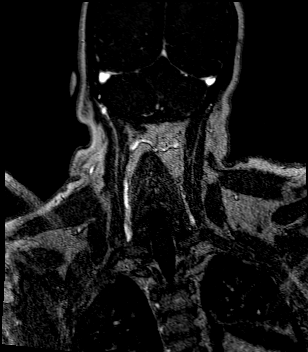

[Series 20: angio_fl3d_cor_post_ttc=3.0s_moco-adv · coronal · 0.9mm · 0.85mm/px · 5 of 96 slices shown (2 of 2)]
[im 1/96]
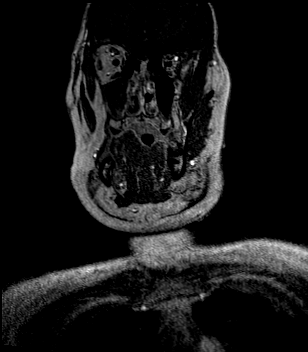
[im 16/96]
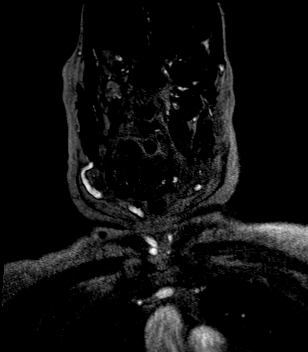
[im 32/96]
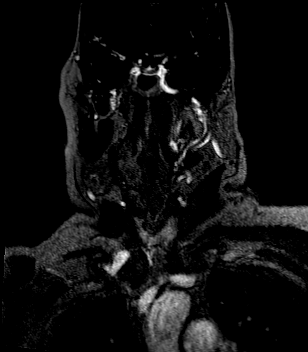
[im 48/96]
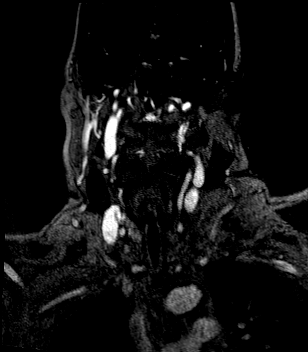
[im 64/96]
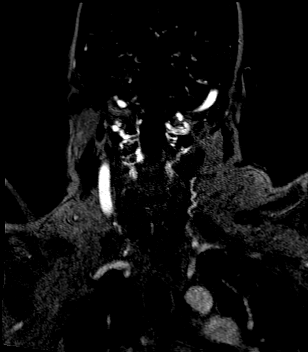

[39 of 48 positions shown; findings below may reference images not displayed]

FINDINGS: MRI HEAD FINDINGS

Brain: Areas of restricted diffusion in the cortex and white matter
bilateral frontal, parietal, occipital, posterior temporal lobes, as
well as the bilateral cerebellar hemispheres. Additional punctate
focus of restricted diffusion in the left caudate head. Some of this
is in a watershed distribution (series 5, image 94), while other
areas are more focal. The cortical areas are associated with gyral
swelling (series 12, images 15 and 20, for example). Areas of
curvilinear susceptibility in the right frontal lobe in the area of
infarct (series 15, image 36), likely hemorrhage. No extra-axial
fluid collection. No mass, mass effect, or midline shift. Remote
left thalamic lacunar infarct.

Vascular: See MRA, below.

Skull and upper cervical spine: Normal marrow signal.

Sinuses/Orbits: Air-fluid levels in the right greater than left
maxillary sinus, with trace fluid in the left sphenoid sinus. NG
tube seen in the nasal cavity. Partially visualized endotracheal
tube in the oral cavity. The orbits are unremarkable.

Other: Fluid in the bilateral mastoid air cells.

MRA HEAD FINDINGS

Anterior circulation: Both internal carotid arteries are patent to
the termini, without stenosis or other abnormality; mildly decreased
signal in the proximal cavernous carotid is felt to be artifactual.
A1 segments patent. Anterior cerebral arteries are patent to their
distal aspects. No significant M1 stenosis or occlusion. Normal MCA
bifurcations. Distal MCA branches well perfused and symmetric.

Posterior circulation: Vertebral arteries patent to the
vertebrobasilar junction without stenosis. Focal area of poor flow
visualization in the basilar artery (series 5, image 73), which may
be artifactual. The basilar is otherwise patent to its distal
aspect. Superior cerebral arteries patent bilaterally. PCAs well
perfused to their distal aspects without stenosis.Posterior
communicating arteries are visualized bilaterally.

Anatomic variants: None significant.

MRA NECK FINDINGS

Aortic arch: Normal three-vessel arch. No significant stenosis or
aneurysm.

Right carotid system: Patent, without hemodynamically significant
stenosis, occlusion, or aneurysm. Plaque at the bifurcation.

Left carotid system: Patent, without hemodynamically significant
stenosis, occlusion, or aneurysm. Plaque at the bifurcation.

Vertebral arteries: Limited visualization of the origin of the right
vertebral artery secondary to artifact. The vertebral arteries are
otherwise patent, without significant stenosis, occlusion, or
aneurysm.

Other: None
IMPRESSION: 1. Multiple areas of restricted diffusion throughout the bilateral
cerebral and cerebellar hemispheres, some of which are in a
watershed pattern, while others are more likely to be embolic, given
different vascular territories. Likely petechial hemorrhage in a
right frontal lobe cortical infarct, but no evidence of significant
hemorrhagic transformation or midline shift.
2. No large vessel occlusion in the intracranial vasculature.
3. No hemodynamically significant stenosis in the neck.

Multiple attempts were made to contact a provider taking care of
this patient to convey these results.

## 2020-11-25 MED ORDER — HEPARIN (PORCINE) 25000 UT/250ML-% IV SOLN
1300.0000 [IU]/h | INTRAVENOUS | Status: DC
  Administered 2020-11-25: 1200 [IU]/h via INTRAVENOUS
  Administered 2020-11-26: 1300 [IU]/h via INTRAVENOUS
  Filled 2020-11-25: qty 250

## 2020-11-25 MED ORDER — PROPOFOL 1000 MG/100ML IV EMUL
5.0000 ug/kg/min | INTRAVENOUS | Status: DC
  Administered 2020-11-25: 5 ug/kg/min via INTRAVENOUS
  Administered 2020-11-26: 25 ug/kg/min via INTRAVENOUS
  Filled 2020-11-25: qty 200
  Filled 2020-11-25: qty 100

## 2020-11-25 MED ORDER — HYDRALAZINE HCL 20 MG/ML IJ SOLN
10.0000 mg | Freq: Four times a day (QID) | INTRAMUSCULAR | Status: DC | PRN
  Administered 2020-11-27: 10 mg via INTRAVENOUS
  Filled 2020-11-25: qty 1

## 2020-11-25 MED ORDER — ATROPINE SULFATE 1 MG/10ML IJ SOSY
PREFILLED_SYRINGE | INTRAMUSCULAR | Status: AC
  Filled 2020-11-25: qty 10

## 2020-11-25 MED ORDER — GADOBUTROL 1 MMOL/ML IV SOLN
8.0000 mL | Freq: Once | INTRAVENOUS | Status: AC | PRN
  Administered 2020-11-25: 8 mL via INTRAVENOUS

## 2020-11-25 MED ORDER — BISACODYL 10 MG RE SUPP
10.0000 mg | Freq: Once | RECTAL | Status: AC
  Administered 2020-11-25: 10 mg via RECTAL
  Filled 2020-11-25: qty 1

## 2020-11-25 MED ORDER — DEXTROSE-NACL 5-0.9 % IV SOLN: INTRAVENOUS | Status: DC

## 2020-11-25 MED ORDER — FUROSEMIDE 10 MG/ML IJ SOLN
40.0000 mg | Freq: Once | INTRAMUSCULAR | Status: AC
  Administered 2020-11-25: 40 mg via INTRAVENOUS
  Filled 2020-11-25: qty 4

## 2020-11-25 MED ORDER — IPRATROPIUM-ALBUTEROL 0.5-2.5 (3) MG/3ML IN SOLN
3.0000 mL | Freq: Two times a day (BID) | RESPIRATORY_TRACT | Status: DC
  Administered 2020-11-25 – 2020-11-29 (×9): 3 mL via RESPIRATORY_TRACT
  Filled 2020-11-25 (×10): qty 3

## 2020-11-25 NOTE — Progress Notes (Addendum)
NAME:  Samantha Cooley, MRN:  [id number], DOB:  [birth year], LOS: 3 ADMISSION DATE:  03-Dec-2020, CONSULTATION DATE:  03-Dec-2020 REFERRING MD:  [facility] - EDP CHIEF COMPLAINT:  Cardiac arrest    History of Present Illness:  60 year old woman who presented to [facility] ED via EMS 9/30 status post PEA arrest.  PMHx significant for HTN, T2DM, anxiety, depression, insomnia.  History obtained in the emergency room. Patient was her usual state of health; she had been on phone with her significant other around 2 PM. Found at 2:50PM unresponsive and pulseless by significant other.  EMS was called and significant other initiated CPR.  EMS arrival noted to be around 2:55PM. Initial rhythm PEA.  ROSC ~15 minutes.   On arrival to the emergency room, patient was intubated, requiring high FiO2. Initially hypertensive, improved with Fentanyl.  Patient would open eyes, stick her tongue out, had equal muscle tone bilaterally and would lift arms spontaneously but could not follow commands with lower extremities.  Preliminary evaluation in the ER: Glucose 372 White blood cell count 18.3 creatinine 1.75 anion gap metabolic acidosis with serum bicarbonate of 11, lactate of greater than 9, troponin 588. Bedside ultrasound was negative for pericardial effusion RV function normal, LV function difficult to assess. Seen by Cardiology, did not think this was consistent with STEMI.  PCCM admitted to ICU for further management post-arrest.  Pertinent Medical History:  HTN, diabetes, anxiety, depression, insomnia.  Significant Hospital Events: Including procedures, antibiotic start and stop dates in addition to other pertinent events   9/30 admitted s/p arrest, intubated in field, seen by cardiology and felt not primary ST elevation event.  Suspected more metabolically driven.  On presentation had severe anion gap metabolic acidosis, hyperglycemia, and encephalopathy but was following some simple commands such as sticking tongue out and  nodding appropriately. Vanc/zosyn started. CT head neck and chest ordered.  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 10/2 Troponin uptrended to 9756, heparin gtt started for ?ACS. 10/3 Cardiology consult for ?ACS, further recs. New RUE weakness/neglect, MRI Brain pending, Neuro consult.  Interim History / Subjective:  No significant evens overnight Attempted vent wean x 2 with RRT this morning, failed due to bradypnea/inconsistent vent triggering Remains on Fentanyl at 49mcg/hr Will wake/follow commands except with RUE MRI Brain today, r/o stroke Cardiology consult for ?ACS Removing HD cath after additional peripheral access obtained Lasix 40mg  IV today for volume overload  Objective   Blood pressure 117/61, pulse 73, temperature 99.5 F (37.5 C), resp. rate 13, height 5\' 4"  (1.626 m), weight 80 kg, SpO2 95 %.    Vent Mode: PRVC FiO2 (%):  [40 %] 40 % Set Rate:  [12 bmp-32 bmp] 16 bmp Vt Set:  [440 mL] 440 mL PEEP:  [8 cmH20] 8 cmH20 Pressure Support:  [10 cmH20] 10 cmH20 Plateau Pressure:  [21 cmH20-24 cmH20] 21 cmH20   Intake/Output Summary (Last 24 hours) at 11/25/2020 1051 Last data filed at 11/25/2020 1014 Gross per 24 hour  Intake 1572.91 ml  Output 1925 ml  Net -352.09 ml    Filed Weights   12-03-2020 1645 11/23/20 0500 11/24/20 0500  Weight: 69.3 kg 72.7 kg 80 kg   Physical Examination: General: Chronically ill-appearing middle-aged woman in NAD. HEENT: [place]/AT, anicteric sclera, PERRL, moist mucous membranes. ETT in place. Neuro: Sedated. Will open eyes to voice. Responds to verbal stimuli. Following commands intermittently. Moves BLE spontaneously. Moves LUE. Not moving RUE, reports +sensation.+Cough and +Gag  CV: RRR, heart sounds distant, no  murmur. PULM: Breathing even and unlabored on vent (PEEP 8, FiO2 40%). Lung fields coarse throughout. GI: Soft, nontender, mildly distended. Hypooactive bowel sounds. Extremities: Bilateral symmetric 2+ LE edema  noted. Skin: Warm/dry, no rashes.  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 Initially seen by Dr. Eldridge Dace on admission for STEMI r/o; was not felt to be STEMI at that time (troponin 588). Troponin uptrend  to 9756 10/2AM, prompting heparin gtt initiation for ?ACS. 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 - Cardiology consult, appreciate recommendations - F/u if need for TEE - Heparin gtt continued for ?ACS (started 10/2AM), d/c at 48H - NE weaned off - Goal MAP remains > 65  Acute hypoxic respiratory failure and aspiration pneumonia requiring mechanical ventilation - Attempted wean 10/3AM, failed due to bradypnea, inconsistent vent triggering - Continue full vent support (4-8cc/kg IBW) - Wean FiO2 for O2 sat > 90% - Daily WUA/SBT - VAP bundle - Continue bronchodilators - Diuresis as tolerated, optimize volume status - Pulmonary hygiene - PAD protocol for sedation: Fentanyl for goal RASS 0 to -1  Hypoxic ischemic encephalopathy Unilateral RUE weakness Appears to be resolving, able to wake up and follow commands, nodding appropriately to questions. Concern with inability to move RUE; able to move BLE and LUE on exam. CT Head 9/30 NAICA on admission. - Obtain MRI Brain without contrast - Neurology consult - Frequent neurologic exams  T2DM Stress hyperglycemia Beta-hydroxybutyrate negative. History of T2DM, home regimen includes metformin. - CBGs Q4H - Basal Levemir Q12H - SSI  AKI, improving Suspect secondary to volume depletion and organ hypoperfusion. - Trend BMP - Replete electrolytes as indicated - Monitor I&Os in the setting of diuresis - Avoid nephrotoxic agents as able - Ensure adequate renal perfusion  Best  Practice: (right click and "Reselect all SmartList Selections" daily)   Diet/type: NPO DVT prophylaxis: SCD GI prophylaxis: PPI Lines: N/A Foley:  Yes, and it is still needed Code Status:  full code Last date of multidisciplinary goals of care discussion [Pending]  Critical care time: 41 minutes   Tim Lair, PA-C [facility] 11/25/20 10:51 AM  Please see [email or web address] for pager details.  From 7A-7P if no response, please call [phone] After hours, please call [place] [phone]    PCCM Attending:   60 yo FM, PEA cardiac arrest, possible acs, no heparin,AHRF intubated on life support. She is moving her left UE but is not moving her right. She is awake on the vent and following commands and says she cant move her right arm.   BP (!) 141/58   Pulse 66   Temp 100.2 F (37.9 C)   Resp 20   Ht 5\' 4"  (1.626 m)   Wt 80 kg   SpO2 100%   BMI 30.27 kg/m   Gen: FM, intubated, ETT in place HENT: NCAT, tracking  Neuro: no movement, no withdrawal to pain on right UE Heart: RRR, s1 s2  Lungs: BL vented breaths   Labs: reviewed   A:  PEA cardiac arrest  Shock, multifactorial  AHRF on MV  RUE weakness? No motor movement compared to left  DMII, hyperglycemia   P: MRI brain today  Consult neuro  Re-consult cardiology ?stop heparin  Wean off pressors  Avoid nephro toxic agents  Follow UOP  This  patient is critically ill with multiple organ system failure; which, requires frequent high complexity decision making, assessment, support, evaluation, and titration of therapies. This was completed through the application of advanced monitoring technologies and extensive interpretation of multiple databases. During this encounter critical care time was devoted to patient care services described in this note for 45 minutes.   Josephine Igo, DO [facility] 11/25/2020 3:38 PM

## 2020-11-25 NOTE — Progress Notes (Signed)
Patient did not tolerate SIMV. Placed back on PRVC with a rate of 16. CCM notified.

## 2020-11-25 NOTE — Progress Notes (Addendum)
eLink Physician-Brief Progress Note Patient Name: Samantha Cooley DOB: 12-21-1960 MRN: [id number]   Date of Service  11/25/2020  HPI/Events of Note  Notified of low glucose.  Tube feeds were held earlier due to vomiting.  OG on low intermittent wall suction. Glucose now at 86.  eICU Interventions  Start on D5NS @ 50cc/hr.     Intervention Category Minor Interventions: Other:  Larinda Buttery 11/25/2020, 8:56 PM  6:09 AM Notified of low K at 3.6, crea 1.12.   Plan> Replete K - Kcl ordered via tube.

## 2020-11-25 NOTE — Progress Notes (Signed)
RT assisted with transportation of this pt from [facility] to MRI and back to 2H11 while on full ventilatory support. While in MRI pt did vomit. Pt was suctioned while in MRI. Pt tolerated well with SVS. RN at bedside at this time.

## 2020-11-25 NOTE — Progress Notes (Signed)
IVT placed 2 new PIV's and removed the left AC PIV d/t bleeding under dressing. When catheter was removed the site continued to bleed. Pressure was held and dressing applied with continued bleeding. Casey-RN at bedside holding pressure to site. PA was also made aware.  Penni Bombard Lonza Shimabukuro,RN-VAST

## 2020-11-25 NOTE — Consult Note (Addendum)
Neurology Consultation  Reason for Consult: Right upper extremity weakness Referring Physician: Zenia Resides, PA  CC: Patient intubated, not sedated in the ICU  History is obtained from: Chart review, unable to obtain from patient due to patient's condition: intubated in the ICU  HPI: Samantha Cooley is a 60 y.o. female with a medical history significant for essential hypertension, type 2 diabetes mellitus, anxiety, depression, and insomnia who presented to the ED 9/30 following an outpatient PEA arrest. Patient was found by her significant other the afternoon of 9/30 unresponsive after speaking to him on the telephone in her normal state of health approximately 50 minutes prior to being found unresponsive. Her significant other initiated CPR and EMS arrived with initial PEA rhythm. EMS initiated CPR and achieved ROSC in approximately 15 minutes. Initial work up revealed an anion gap acidosis, elevated lactate, hyperglycemia, leukocytosis, elevated troponin, respiratory acidosis, and hypoxia with chest x-ray findings significant for bilateral infiltrates. Patient does follow simple commands while intubated, however, there have been concerns for right hand and right upper extremity weakness since the morning of 10/1 with some documentation reporting no movement of the right arm and no response to pain. Due to ongoing concern for unilateral weakness, neurology was consulted for further evaluation.   Here she has been intermittently requiring Levophed for blood pressure support although she is additionally occasionally hypertensive especially when off sedation.  Notably today she has also been having some abdominal distention and firmness with emesis including some emesis during MRI though her airway is currently protected by intubation.  Notably heparin drip was stopped around 11 AM or noon secondary to oozing PIV's; she had been on this for concern for ACS given troponin peak in the 9000's, which is now  resolving.  Per patient's family at bedside, patient was able to make eye contact, fixate on them, and track them initially on Friday 9/30 but they noticed decreased eye contact and eye movement on 10/1 or 10/2.   LKW: 11/10/2020 2:00 PM TNK given?: no, patient well outside of time window for thrombolytic therapy IR Thrombectomy? No, patient last known well 9/30, presentation is not consistent with LVO.  Modified Rankin Scale: 0-Completely asymptomatic and back to baseline post- stroke  ROS: Unable to obtain due to altered mental status.   Past Medical History:  Diagnosis Date   Diabetes ([place])    HTN (hypertension)    Past Surgical History:  Procedure Laterality Date   ABDOMINAL HYSTERECTOMY     CESAREAN SECTION     Family History  Problem Relation Age of Onset   Cancer Mother    Social History:   reports that she has been smoking e-cigarettes and cigarettes. She has quit using smokeless tobacco. She reports current alcohol use. She reports current drug use. Drug: Marijuana.  Medications  Current Facility-Administered Medications:    0.9 %  sodium chloride infusion, , Intravenous, Continuous, Sood, Vineet, MD, Last Rate: 10 mL/hr at 11/25/20 0900, Infusion Verify at 11/25/20 0900   0.9 %  sodium chloride infusion, 250 mL, Intravenous, Continuous, Oletta Darter Virgina Evener, MD   0.9 %  sodium chloride infusion, 250 mL, Intravenous, Continuous, Oletta Darter Virgina Evener, MD   acetaminophen (TYLENOL) 160 MG/5ML solution 650 mg, 650 mg, Per Tube, Q6H PRN, Chesley Mires, MD, 650 mg at 11/24/20 1656   chlorhexidine gluconate (MEDLINE KIT) (PERIDEX) 0.12 % solution 15 mL, 15 mL, Mouth Rinse, BID, Agarwala, Ravi, MD, 15 mL at 11/25/20 0843   Chlorhexidine Gluconate Cloth 2 % PADS 6  each, 6 each, Topical, Daily, Chesley Mires, MD, 6 each at 11/25/20 0844   dextrose 50 % solution 0-50 mL, 0-50 mL, Intravenous, PRN, Chesley Mires, MD   docusate (COLACE) 50 MG/5ML liquid 100 mg, 100 mg, Per Tube, BID, Chesley Mires, MD, 100 mg at 11/25/20 0832   escitalopram (LEXAPRO) tablet 20 mg, 20 mg, Per Tube, QHS, Agarwala, Einar Grad, MD, 20 mg at 11/24/20 2234   feeding supplement (PROSource TF) liquid 45 mL, 45 mL, Per Tube, TID, Kipp Brood, MD, 45 mL at 11/25/20 7353   feeding supplement (VITAL HIGH PROTEIN) liquid 1,000 mL, 1,000 mL, Per Tube, Continuous, Agarwala, Einar Grad, MD, Last Rate: 40 mL/hr at 11/24/20 2039, 1,000 mL at 11/24/20 2039   fentaNYL (SUBLIMAZE) bolus via infusion 50-100 mcg, 50-100 mcg, Intravenous, Q15 min PRN, Chesley Mires, MD, 100 mcg at 11/24/20 1128   fentaNYL 2544mg in NS 2539m(1029mml) infusion-PREMIX, 50-200 mcg/hr, Intravenous, Continuous, Sood, Vineet, MD, Last Rate: 7.5 mL/hr at 11/25/20 0900, 75 mcg/hr at 11/25/20 0900   free water 30 mL, 30 mL, Per Tube, Q4H, Agarwala, Ravi, MD, 30 mL at 11/25/20 1200   hydrALAZINE (APRESOLINE) injection 10 mg, 10 mg, Intravenous, Q6H PRN, ReeNevada Crane PA-C   insulin aspart (novoLOG) injection 2-6 Units, 2-6 Units, Subcutaneous, Q4H, Agarwala, Ravi, MD, 2 Units at 11/25/20 0832   insulin detemir (LEVEMIR) injection 12 Units, 12 Units, Subcutaneous, Q12H, Agarwala, RavEinar GradD, 12 Units at 11/25/20 0833   ipratropium-albuterol (DUONEB) 0.5-2.5 (3) MG/3ML nebulizer solution 3 mL, 3 mL, Nebulization, TID, Agarwala, Ravi, MD, 3 mL at 11/25/20 0718   LORazepam (ATIVAN) tablet 2 mg, 2 mg, Per Tube, Q6H PRN, AgaKipp BroodD, 2 mg at 11/25/20 0832992MEDLINE mouth rinse, 15 mL, Mouth Rinse, 10 times per day, AgaKipp BroodD, 15 mL at 11/25/20 1157   midazolam (VERSED) injection 2 mg, 2 mg, Intravenous, Q2H PRN, SooChesley MiresD, 2 mg at 11/25/20 0243   norepinephrine (LEVOPHED) 4mg40m 250mL9mmix infusion, 0-40 mcg/min, Intravenous, Titrated, GeorgEstill Cotta Stopped at 11/25/20 0740   pantoprazole (PROTONIX) injection 40 mg, 40 mg, Intravenous, Q24H, Sood,Halford Chessmaneet, MD, 40 mg at 11/24/20 1656   polyethylene glycol (MIRALAX / GLYCOLAX)  packet 17 g, 17 g, Per Tube, Daily, Sood,Chesley Mires 17 g at 11/25/20 0832   propofol (DIPRIVAN) 1000 MG/100ML infusion, 5-80 mcg/kg/min, Intravenous, Titrated, ReeseAyesha Rumpfphanie M, PA-C   sodium chloride flush (NS) 0.9 % injection 10-40 mL, 10-40 mL, Intracatheter, Q12H, Sood, Vineet, MD, 10 mL at 11/25/20 1015   sodium chloride flush (NS) 0.9 % injection 10-40 mL, 10-40 mL, Intracatheter, PRN, Sood,Chesley Mires Exam: Current vital signs: BP (!) 134/55   Pulse 71   Temp 99.7 F (37.6 C)   Resp (!) 21   Ht 5' 4"  (1.626 m)   Wt 80 kg   SpO2 98%   BMI 30.27 kg/m  Vital signs in last 24 hours: Temp:  [99.5 F (37.5 C)-100.4 F (38 C)] 99.7 F (37.6 C) (10/03 1121) Pulse Rate:  [60-82] 71 (10/03 1121) Resp:  [13-32] 21 (10/03 1121) BP: (70-147)/(49-71) 134/55 (10/03 1121) SpO2:  [93 %-100 %] 98 % (10/03 1121) Arterial Line BP: (106-143)/(44-77) 130/55 (10/03 1000) FiO2 (%):  [40 %] 40 % (10/03 1121)  GENERAL: Asleep initially, laying comfortably in ICU bed, intubated, not on sedation Psych: Patient is calm and cooperative with examination but remains intubated and not on sedation. She does intermittently become restless and anxious during  examination.  Head: Normocephalic and atraumatic, without obvious abnormality.  EENT: Normal conjunctivae, oral ETT in place, secured. NGT in place with tube feedings infusing. Patient has a cough with minimal, tan, thin secretions in ETT, suction initiates patient cough.  LUNGS: Intubated, respirations supported via mechanical ventilation, she does have spontaneous respirations over set ventilator rate.  Double stacking the ventilator when off sedation CV: Regular rate and rhythm on telemetry ABDOMEN: Slightly firm and slightly distended, nontender Extremities: cool to touch feet bilaterally, right hand with 2+ non-pitting edema, extremities are without obvious deformity  NEURO:  Mental Status: Patient is asleep initially, wakes easily to voice.  She remains intubated in the ICU without sedation on NP examination. She is able to nod "yes" and shake "no" appropriately to many questions but at times answers incorrectly. She nods yes to being in the hospital, her correct age, and the correct year.  She does incorrectly identify that the month is August. She is unable to provide a clear and coherent history of present illness due to intubation, unable to assess speech at this time. She follows simple commands with repeat instruction, with improved command following when her hand is held in front of her face for example.  On MD repeat examination, patient had been placed on sedation due to increased agitation.  Cranial Nerves:  II: PERRL 3 mm/brisk.   III, IV, VI: Her gaze is disconjugate with right eye slightly right of midline and left eye at midline.  She is unable to fixate or track with her eye movements. She has occasional spontaneous leftward eye movements within a midline gaze but is unable to cross midline to full left lateral gaze on NP assessment. She is able to cross midline to leftward gaze for attending MD. 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  V: Sensation is intact to light touch and symmetrical to face. Blinks to threat on the right with inconsistent blink to threat on the left for NP, opposite for MD. Patient consistently indicates that she is not having visual deficits nor trouble moving her eyes, though it is clear she cannot orient to stimuli.  VII: Face appears symmetric resting and with movement within the limitation of ETT placement.  VIII: Hearing is intact to voice IX, X:Patient has a spontaneous cough and gag.  XI: Shoulder shrug with increased elevation on the left. XII: Tongue protrudes midline without fasciculations.   Motor: 5/5 strength present in bilateral lower extremities without vertical drift.  Left upper extremity strength is also 5/5 without pronator drift.  Right upper extremity does have  some antigravity movement but is unable to lift more than 10 degrees off of the bed. She is able to move her RUE laterally with vertical drift on assessment.  Right hand grip strength significantly weak with minimal right hand movement and 1/5 strength, left grip strength 5/5.  Tone is normal. Bulk is normal.  Sensation: Intact to light touch bilaterally in all four extremities.  Coordination: Unable to assess, patient has difficulty following multi-step commands  DTRs: 3+ and symmetric patellae, biceps, and brachioradialis throughout.  Plantars: Toes upgoing bilaterally Gait: Deferred  NIHSS: 1a Level of Conscious.: 0 1b LOC Questions: 1 1c LOC Commands: 0 2 Best Gaze: 2 3 Visual: 1; inconsistent blink to threat on the left 4 Facial Palsy: 0 5a Motor Arm - left: 0 5b Motor Arm - Right: 2 6a Motor Leg - Left: 0 6b Motor Leg - Right: 1 7 Limb Ataxia: 0 8 Sensory:  0 9 Best Language: 2 10 Dysarthria: UN 11 Extinct. and Inatten.: 0 TOTAL: 9  Attending NIH 16 (one-point for drowsiness, one-point for not answering month or age correctly, one-point for difficulty following some commands intermittently, 2 points for gaze palsies as described above, 2 points for hemianopia, one-point for left upper extremity weakness, 3 points for right upper extremity weakness, one-point for lower right lower extremity weakness, one-point for left lower extremity weakness, one-point for mild to moderate aphasia with some difficulty understanding some commands/not naming correctly by choices, one-point for neglect of her deficits), this may be confounded by increased sedation compared to prior  Labs I have reviewed labs in epic and the results pertinent to this consultation are: CBC    Component Value Date/Time   WBC 7.4 11/25/2020 0341   RBC 2.97 (L) 11/25/2020 0341   HGB 9.9 (L) 11/25/2020 0341   HCT 30.0 (L) 11/25/2020 0341   PLT 61 (L) 11/25/2020 0341   MCV 101.0 (H) 11/25/2020 0341   MCH 33.3  11/25/2020 0341   MCHC 33.0 11/25/2020 0341   RDW 13.8 11/25/2020 0341   LYMPHSABS 1.1 11/25/2020 0341   MONOABS 0.3 11/25/2020 0341   EOSABS 0.1 11/25/2020 0341   BASOSABS 0.0 11/25/2020 0341   CMP     Component Value Date/Time   NA 132 (L) 11/25/2020 0341   K 3.9 11/25/2020 0341   CL 104 11/25/2020 0341   CO2 19 (L) 11/25/2020 0341   GLUCOSE 184 (H) 11/25/2020 0341   BUN 18 11/25/2020 0341   CREATININE 1.12 (H) 11/25/2020 0341   CALCIUM 7.4 (L) 11/25/2020 0341   PROT 4.8 (L) 11/25/2020 0341   ALBUMIN 2.4 (L) 11/25/2020 0341   AST 31 11/25/2020 0341   ALT 33 11/25/2020 0341   ALKPHOS 81 11/25/2020 0341   BILITOT 0.7 11/25/2020 0341   GFRNONAA 56 (L) 11/25/2020 0341   Lipid Panel  No results found for: CHOL, TRIG, HDL, CHOLHDL, VLDL, LDLCALC, LDLDIRECT  Lab Results  Component Value Date   HGBA1C 6.5 (H) 11/23/2020   Imaging I have reviewed the images obtained:  CT-scan of the brain 11/10/2020 personally reviewed by MD: 1. No acute intracranial pathology. 2. No fracture or static subluxation of the cervical spine. 3. Mild multilevel cervical disc degenerative disease.  MRI examination of the brain, MRA head and neck personally reviewed by MD with additional comments added parenthetically 1. Multiple areas of restricted diffusion throughout the bilateral cerebral and cerebellar hemispheres, some of which are in a watershed pattern, while others are more likely to be embolic, given different vascular territories. Likely petechial hemorrhage in a right frontal lobe cortical infarct, but no evidence of significant hemorrhagic transformation or midline shift. 2. No large vessel occlusion in the intracranial vasculature (question basilar stenosis versus artifact) 3. No hemodynamically significant stenosis in the neck.  Echocardiogram TTE 11/23/2020:  1. No mural thrombus - filling defects in the LV cavity noted with Definity contrast, suspect related to hypertrophied papillary  muscle and subchordal structures. There also appears to be color flow across the basal septum concerning for a membranous VSD or possible eccentric AI jet. Left ventricular ejection fraction, by estimation, is 55 to 60%. The left ventricle has normal function. The left ventricle has no regional wall motion abnormalities. There is moderate left ventricular hypertrophy. Left ventricular diastolic parameters are consistent with Grade I diastolic dysfunction (impaired relaxation).   2. Right ventricular systolic function is low normal. The right ventricular size is normal.   3.  The mitral valve was not well visualized. No evidence of mitral valve regurgitation.   4. The aortic valve was not well visualized. There is moderate calcification of the aortic valve. Aortic valve regurgitation is mild. Mild to moderate aortic valve sclerosis/calcification is present, without any evidence of aortic stenosis. Aortic regurgitation PHT measures 495 msec. Aortic valve mean gradient measures 4.0 mmHg.   Assessment: 60 year old female who presented 9/30 s/p PEA arrest after being found unresponsive by her significant other. EMS achieved ROSC after approximately 15 minutes of CPR.  While intubated, there is an ongoing concern for right upper extremity weakness and neurology was consulted for further evaluation. - Examination reveals patient with right upper extremity weakness compared to the left with significantly weak right grip strength, disconjugate gaze with eyes midline and minimal spontaneous leftward eye movements when attempting to orient to voice within the midline gaze and a bilateral vertical gaze palsy (she is able to cross midline towards the left on attending MD evaluation later in the afternoon), and inconsistent blink to threat on the left. Patient does indicate that she is unable to use her right upper extremity as well as the left upper extremity but denies any pain of the extremity. On attending evaluation,  patient also has a drift of the right lower extremity.  -- MRI brain reveals multiple areas of restricted diffusion throughout the bilateral cerebral and cerebellar hemispheres, some which are in a watershed pattern, while others are more likely to be embolic, given the different vascular territories. There is also likely petechial hemorrhage in the right frontal lobe cortical infarct without evidence of significant hemorrhagic transformation or midline shift.  - Vessel imaging is without LVO or hemodynamically significant stenosis in the neck on MR angio.  - Presentation is concerning for an acute to subacute infarctions in the setting of impaired cerebral blood flow with hypoxia 2/2 cardiac arrest with a watershed appearance on MRI imaging versus cardioembolic 2/2 cardiac arrest due to embolic appearing infarctions on MRI review.  - Stroke risk factors include PEA arrest, hypertension, type 2 diabetes, and obesity with a BMI of 30.27  Extensive discussion by MD at bedside with patient and family (son and son's partner).  Imaging personally reviewed with family.  Given time course of events, highly suspect central cardioembolic source, with at least a substantial additional insult occurring sometime between admission and 10/1.  In this setting given concern for intracardiac thrombus, patient does need to be on anticoagulation pending no residual intracardiac thrombus on TEE  Recommendations: - Stroke prophylaxis: on heparin gtt, need to continue until TEE is complete without evidence of an LV thrombus  -Reordered as a low goal no bolus protocol to minimize hemorrhage risk, which was discussed with family at bedside - Lipid panel ordered, with LDL goal of < 70; hemoglobin A1c is at goal of < 7% - TEE to evaluate for thrombus in the setting of embolic appearing infarcts on MRI, appreciate cardiology following - Frequent neuro checks, every hour overnight - Goal blood pressure: normotension as patient is  out of permissive hypertension window, however please strictly avoid hypotension, maintain MAP of > 65  - PT/OT/Speech therapies  - Risk factor modification - Stroke team to follow - Appreciate management of ventilator, abdominal issues and other comorbidities by primary team - Appreciate cardiology following  Pt seen by NP/Neuro and later by MD. Note/plan to be edited by MD as needed.  Anibal Henderson, AGAC-NP [facility] Pager: [phone]  Attending Neurologist's note:  I personally saw this patient, gathering history, performing a full neurologic examination, reviewing relevant labs, personally reviewing relevant imaging including head CT, MRI brain, MRA head and neck, and formulated the assessment and plan, adding the note above for completeness and clarity to accurately reflect my thoughts  Lesleigh Noe MD-PhD [facility] [phone]  Available 7 AM to 7 PM, outside these hours please contact Neurologist on call listed on [facility] Performed by: Lorenza Chick   Total critical care time: 50 minutes  Critical care time was exclusive of separately billable procedures and treating other patients and independent of time spent by the nurse practitioner.  Critical care was necessary to treat or prevent imminent or life-threatening deterioration, with acute stroke in the setting of high concern for central cardioembolic issues requiring heparin drip.  Critical care was time spent personally by me on the following activities: development of treatment plan with patient and/or surrogate as well as nursing, discussions with consultants, evaluation of patient's response to treatment, examination of patient, obtaining history from patient or surrogate, ordering and performing treatments and interventions, ordering and review of laboratory studies, ordering and review of radiographic studies, pulse oximetry and re-evaluation of patient's  condition.

## 2020-11-25 NOTE — Progress Notes (Signed)
ANTICOAGULATION CONSULT NOTE - Follow Up Consult  Pharmacy Consult for heparin Indication:  r/o ACS s/p cardiac arrest  Labs: Recent Labs    12-21-2020 2020 12-21-20 2340 11/23/20 0758 11/23/20 0810 11/24/20 0357 11/24/20 0806 11/24/20 2134 11/25/20 0341  HGB  --    < >  --   --  11.1*  --  10.5* 9.9*  HCT  --    < >  --   --  32.5*  --  32.9* 30.0*  PLT  --    < >  --   --  PLATELET CLUMPS NOTED ON SMEAR, UNABLE TO ESTIMATE  --  61* 61*  HEPARINUNFRC  --   --   --   --   --  0.50 0.26* 0.13*  CREATININE 1.35*   < >  --  1.77* 1.30*  --   --  1.12*  CKTOTAL 574*  --   --   --   --   --   --   --   TROPONINIHS 3,818*  --  9,756*  --  2,818*  --   --   --    < > = values in this interval not displayed.    Assessment: 60yo female subtherapeutic on heparin with lower heparin level despite increased rate; RN notes that IV site was bleeding and heparin was paused x6min about an hour before lab drawn (bleeding now resolved); Hgb and Plt down but no other signs of bleeding, 4T score of 3 (low probability of HIT).  Goal of Therapy:  Heparin level 0.3-0.7 units/ml   Plan:  Will increase heparin infusion by 1-2 units/kg/hr to 1200 units/hr and check level in 8 hours.    Vernard Gambles, PharmD, BCPS  11/25/2020,4:40 AM

## 2020-11-25 NOTE — Progress Notes (Signed)
ANTICOAGULATION CONSULT NOTE - Follow Up Consult  Pharmacy Consult for Heparin Indication: CVA with high potential for intracardiac thrombus,   Allergies  Allergen Reactions   Aspirin    Aspirin Buffered Other (See Comments)    unspecified   Sertraline Other (See Comments)    Face swelled up.   Sumatriptan Other (See Comments)    In a fog.  Like a zombie.    Patient Measurements: Height: 5\' 4"  (162.6 cm) Weight: 80 kg (176 lb 5.9 oz) IBW/kg (Calculated) : 54.7 Heparin Dosing Weight: 72 kg  Vital Signs: Temp: 100.2 F (37.9 C) (10/03 1300) BP: 141/58 (10/03 1505) Pulse Rate: 66 (10/03 1300)  Labs: Recent Labs    12/15/2020 2020 12-15-2020 2340 11/23/20 0758 11/23/20 0810 11/24/20 0357 11/24/20 0806 11/24/20 2134 11/25/20 0341  HGB  --    < >  --   --  11.1*  --  10.5* 9.9*  HCT  --    < >  --   --  32.5*  --  32.9* 30.0*  PLT  --    < >  --   --  PLATELET CLUMPS NOTED ON SMEAR, UNABLE TO ESTIMATE  --  61* 61*  HEPARINUNFRC  --   --   --   --   --  0.50 0.26* 0.13*  CREATININE 1.35*   < >  --  1.77* 1.30*  --   --  1.12*  CKTOTAL 574*  --   --   --   --   --   --   --   TROPONINIHS 01/25/21*  --  9,756*  --  2,818*  --   --   --    < > = values in this interval not displayed.    Estimated Creatinine Clearance: 53.3 mL/min (A) (by C-G formula based on SCr of 1.12 mg/dL (H)).   Assessment: Anticoag: Hep for ACS vs CVA with high potential for intracardiac thrombus,  - Hgb down to 9.9. Plts only 61. IV site with bleeding. (Heparin off 10/3 at 1113). Resumed 10/3 PM per neuro.  Goal of Therapy:  Heparin level 0.3-0.5 units/ml Monitor platelets by anticoagulation protocol: Yes   Plan:  - Resume IV heparin at previous 1200 units/hr Will check heparin level in 6-8 hrs. Daily HL and CBC (low goal, no bolus)   Samantha Ambers S. 07-15-1993, PharmD, BCPS Clinical Staff Pharmacist [email or web address] Samantha Cooley, Samantha Cooley 11/25/2020,6:13 PM

## 2020-11-26 ENCOUNTER — Inpatient Hospital Stay (HOSPITAL_COMMUNITY): Payer: Self-pay

## 2020-11-26 DIAGNOSIS — J9601 Acute respiratory failure with hypoxia: Secondary | ICD-10-CM

## 2020-11-26 DIAGNOSIS — I639 Cerebral infarction, unspecified: Secondary | ICD-10-CM

## 2020-11-26 DIAGNOSIS — I6310 Cerebral infarction due to embolism of unspecified precerebral artery: Secondary | ICD-10-CM

## 2020-11-26 DIAGNOSIS — I6340 Cerebral infarction due to embolism of unspecified cerebral artery: Secondary | ICD-10-CM | POA: Insufficient documentation

## 2020-11-26 DIAGNOSIS — I340 Nonrheumatic mitral (valve) insufficiency: Secondary | ICD-10-CM

## 2020-11-26 LAB — GLUCOSE, CAPILLARY
Glucose-Capillary: 105 mg/dL — ABNORMAL HIGH (ref 70–99)
Glucose-Capillary: 108 mg/dL — ABNORMAL HIGH (ref 70–99)
Glucose-Capillary: 142 mg/dL — ABNORMAL HIGH (ref 70–99)
Glucose-Capillary: 84 mg/dL (ref 70–99)
Glucose-Capillary: 87 mg/dL (ref 70–99)
Glucose-Capillary: 92 mg/dL (ref 70–99)

## 2020-11-26 LAB — CBC WITH DIFFERENTIAL/PLATELET
Abs Immature Granulocytes: 0.1 10*3/uL — ABNORMAL HIGH (ref 0.00–0.07)
Basophils Absolute: 0 10*3/uL (ref 0.0–0.1)
Basophils Relative: 0 %
Eosinophils Absolute: 0.1 10*3/uL (ref 0.0–0.5)
Eosinophils Relative: 2 %
HCT: 28.1 % — ABNORMAL LOW (ref 36.0–46.0)
Hemoglobin: 9.2 g/dL — ABNORMAL LOW (ref 12.0–15.0)
Immature Granulocytes: 2 %
Lymphocytes Relative: 18 %
Lymphs Abs: 1.2 10*3/uL (ref 0.7–4.0)
MCH: 33 pg (ref 26.0–34.0)
MCHC: 32.7 g/dL (ref 30.0–36.0)
MCV: 100.7 fL — ABNORMAL HIGH (ref 80.0–100.0)
Monocytes Absolute: 0.4 10*3/uL (ref 0.1–1.0)
Monocytes Relative: 6 %
Neutro Abs: 4.8 10*3/uL (ref 1.7–7.7)
Neutrophils Relative %: 72 %
Platelets: 61 10*3/uL — ABNORMAL LOW (ref 150–400)
RBC: 2.79 MIL/uL — ABNORMAL LOW (ref 3.87–5.11)
RDW: 13.8 % (ref 11.5–15.5)
WBC: 6.7 10*3/uL (ref 4.0–10.5)
nRBC: 0 % (ref 0.0–0.2)

## 2020-11-26 LAB — COMPREHENSIVE METABOLIC PANEL
ALT: 24 U/L (ref 0–44)
AST: 21 U/L (ref 15–41)
Albumin: 2.3 g/dL — ABNORMAL LOW (ref 3.5–5.0)
Alkaline Phosphatase: 80 U/L (ref 38–126)
Anion gap: 9 (ref 5–15)
BUN: 14 mg/dL (ref 8–23)
CO2: 23 mmol/L (ref 22–32)
Calcium: 7.8 mg/dL — ABNORMAL LOW (ref 8.9–10.3)
Chloride: 102 mmol/L (ref 98–111)
Creatinine, Ser: 1.12 mg/dL — ABNORMAL HIGH (ref 0.44–1.00)
GFR, Estimated: 56 mL/min — ABNORMAL LOW (ref >60)
Glucose, Bld: 110 mg/dL — ABNORMAL HIGH (ref 70–99)
Potassium: 3.6 mmol/L (ref 3.5–5.1)
Sodium: 134 mmol/L — ABNORMAL LOW (ref 135–145)
Total Bilirubin: 0.8 mg/dL (ref 0.3–1.2)
Total Protein: 5.1 g/dL — ABNORMAL LOW (ref 6.5–8.1)

## 2020-11-26 LAB — HEPARIN LEVEL (UNFRACTIONATED)
Heparin Unfractionated: 0.14 IU/mL — ABNORMAL LOW (ref 0.30–0.70)
Heparin Unfractionated: 0.29 IU/mL — ABNORMAL LOW (ref 0.30–0.70)

## 2020-11-26 LAB — MRSA NEXT GEN BY PCR, NASAL: MRSA by PCR Next Gen: NOT DETECTED

## 2020-11-26 LAB — TRIGLYCERIDES: Triglycerides: 109 mg/dL (ref <150)

## 2020-11-26 IMAGING — DX DG CHEST 1V PORT
1 series · 1 of 1 positions shown · non-contrast
Comparison: [DATE].

CLINICAL DATA: Hypoxia.

EXAM:
PORTABLE CHEST 1 VIEW

[chest]
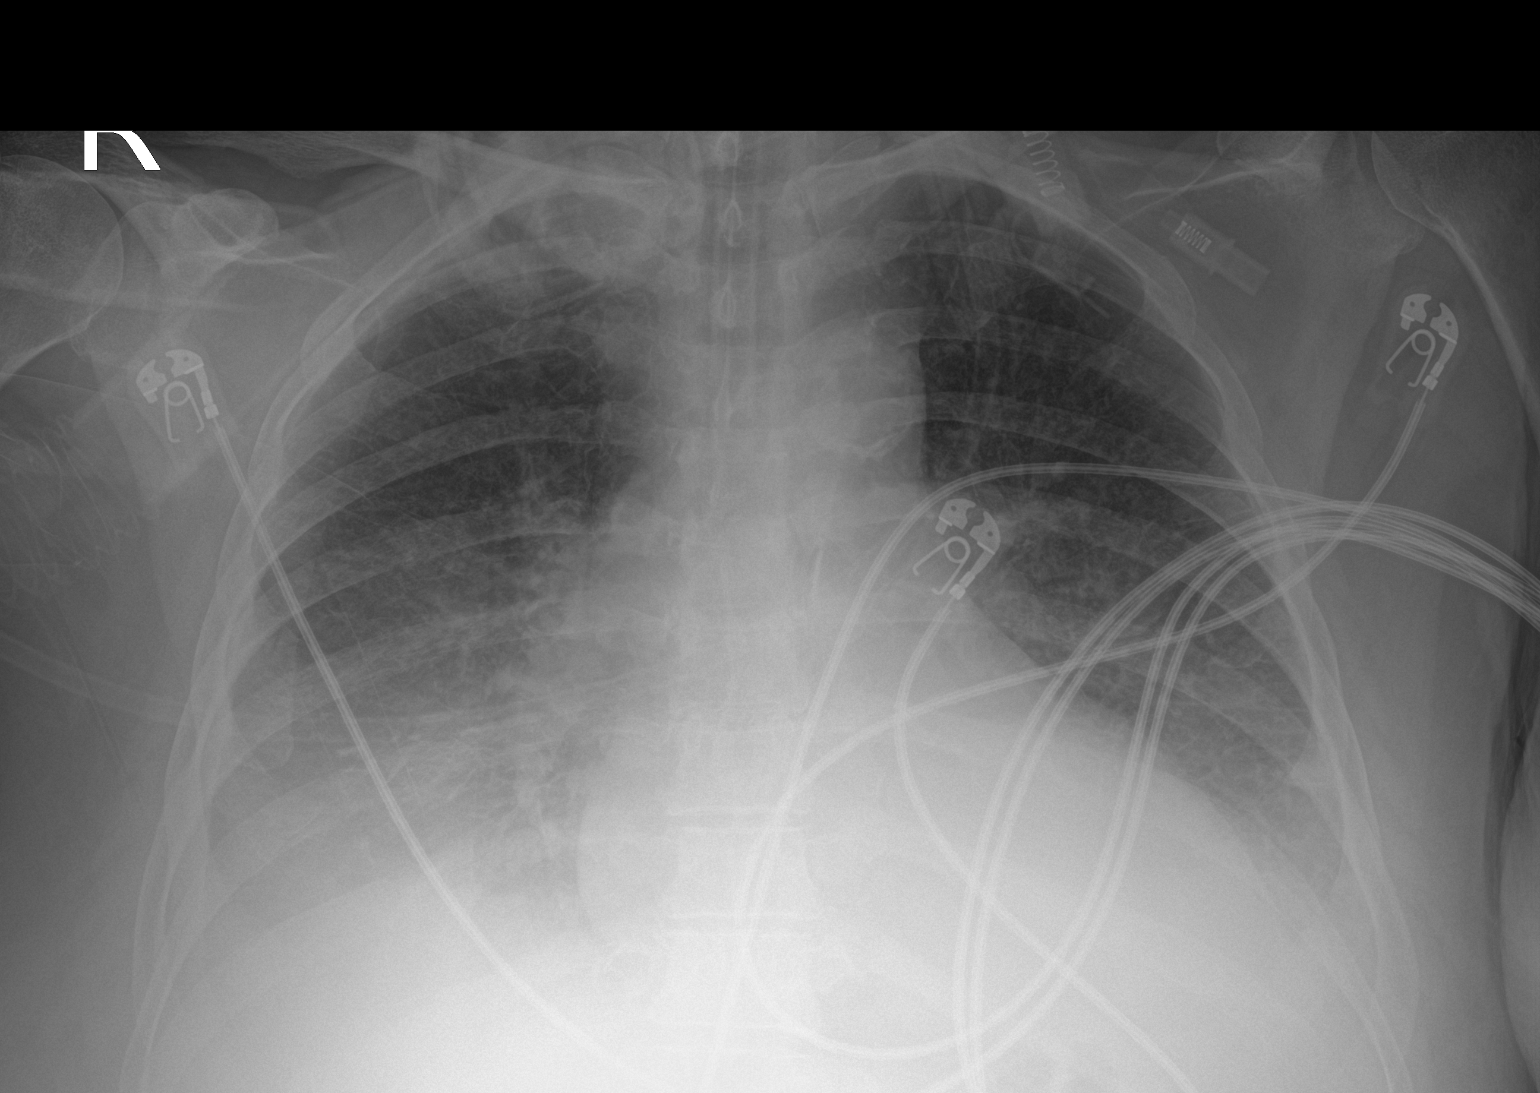

[1 of 1 positions shown; findings below may reference images not displayed]

FINDINGS: Stable cardiomediastinal silhouette. Endotracheal tube is in good
position. Bibasilar atelectasis or edema is noted with associated
pleural effusions. Bony thorax is unremarkable.
IMPRESSION: Bibasilar atelectasis or edema is noted with associated pleural
effusions.

## 2020-11-26 MED ORDER — HEPARIN SODIUM (PORCINE) 5000 UNIT/ML IJ SOLN
5000.0000 [IU] | Freq: Three times a day (TID) | INTRAMUSCULAR | Status: DC
  Administered 2020-11-26 – 2020-11-28 (×5): 5000 [IU] via SUBCUTANEOUS
  Filled 2020-11-26 (×5): qty 1

## 2020-11-26 MED ORDER — VANCOMYCIN HCL 1250 MG/250ML IV SOLN
1250.0000 mg | INTRAVENOUS | Status: DC
  Administered 2020-11-27: 1250 mg via INTRAVENOUS
  Filled 2020-11-26 (×2): qty 250

## 2020-11-26 MED ORDER — VANCOMYCIN HCL 1750 MG/350ML IV SOLN
1750.0000 mg | Freq: Once | INTRAVENOUS | Status: AC
  Administered 2020-11-26: 1750 mg via INTRAVENOUS
  Filled 2020-11-26: qty 350

## 2020-11-26 MED ORDER — SODIUM CHLORIDE 0.9 % IV SOLN
2.0000 g | INTRAVENOUS | Status: DC
  Administered 2020-11-26 – 2020-11-27 (×2): 2 g via INTRAVENOUS
  Filled 2020-11-26 (×3): qty 20

## 2020-11-26 MED ORDER — POTASSIUM CHLORIDE 20 MEQ PO PACK
40.0000 meq | PACK | Freq: Once | ORAL | Status: AC
  Administered 2020-11-26: 40 meq
  Filled 2020-11-26: qty 2

## 2020-11-26 MED ORDER — DEXMEDETOMIDINE HCL IN NACL 400 MCG/100ML IV SOLN
0.4000 ug/kg/h | INTRAVENOUS | Status: DC
  Administered 2020-11-26 (×2): 0.5 ug/kg/h via INTRAVENOUS
  Administered 2020-11-26: 1 ug/kg/h via INTRAVENOUS
  Administered 2020-11-27 (×2): 1.2 ug/kg/h via INTRAVENOUS
  Administered 2020-11-27: 0.4 ug/kg/h via INTRAVENOUS
  Administered 2020-11-27 – 2020-11-29 (×8): 1.2 ug/kg/h via INTRAVENOUS
  Administered 2020-11-29 (×2): 0.8 ug/kg/h via INTRAVENOUS
  Filled 2020-11-26 (×14): qty 100

## 2020-11-26 NOTE — Progress Notes (Signed)
Extubation was held off due to decreased SpO2 into the 70's, Increased HR & BP while preparing to extubate. Patient was bagged with a PEEP valve & lavaged with improved SpO2 & placed back on the vent with 100% FIO2 & PEEP was increased to +8. A bite block was placed on ETT without complications. ETT is currently 23 at the lip & secure. CCM PA was at the bedside & aware of events.

## 2020-11-26 NOTE — Progress Notes (Signed)
    [facility] has been requested to perform a transesophageal echocardiogram on 11/26/20 for Stroke.  After careful review of history and examination, the risks and benefits of transesophageal echocardiogram have been explained including risks of esophageal damage, perforation (1:10,000 risk), bleeding, pharyngeal hematoma as well as other potential complications associated with conscious sedation including aspiration, arrhythmia, respiratory failure and death. Alternatives to treatment were discussed, questions were answered with patient's husband at the bedside as the patient is intubated and unable to give consent. He is agreeable to proceed.   Laverda Page, NP-C 11/26/2020 10:21 AM

## 2020-11-26 NOTE — Progress Notes (Signed)
Inpatient Diabetes Program Recommendations  AACE/ADA: New Consensus Statement on Inpatient Glycemic Control (2015)  Target Ranges:  Prepandial:   less than 140 mg/dL      Peak postprandial:   less than 180 mg/dL (1-2 hours)      Critically ill patients:  140 - 180 mg/dL   Lab Results  Component Value Date   GLUCAP 142 (H) 11/26/2020   HGBA1C 6.5 (H) 11/23/2020    Review of Glycemic Control Results for Practice [facility] (MRN [id number]) as of 11/26/2020 11:29  Ref. Range 11/25/2020 19:50 11/26/2020 00:13 11/26/2020 03:13 11/26/2020 08:11 11/26/2020 11:22  Glucose-Capillary Latest Ref Range: 70 - 99 mg/dL 86 84 92 478 (H) 412 (H)   Diabetes history: DM 2 Outpatient Diabetes medications:  Metformin-XR 500 mg bid Current orders for Inpatient glycemic control:  Novolog 2-6 units q 4 hours Levemir 12 units bid  Inpatient Diabetes Program Recommendations:    Please reduce Levemir to 5 units bid.   Thanks,  Beryl Meager, RN, BC-ADM Inpatient Diabetes Coordinator Pager [phone]  (8a-5p)

## 2020-11-26 NOTE — Progress Notes (Addendum)
STROKE TEAM PROGRESS NOTE   ATTENDING NOTE: I reviewed above note and agree with the assessment and plan. Pt was seen and examined.   60 year old female with history of hypertension, diabetes admitted for outpatient PEA arrest, unresponsiveness, stat post CPR 15 minutes.  Patient was found to have acidosis, hyperglycemia, leukocytosis, elevated troponin, hypoxia and bilateral lung infiltrates.  CT head 10/26/2020 no acute finding.  She was intubated, on heparin IV for ACS management.  However, she was found to have right upper extremity weakness later.  MRI on 11/25/2020 showed multifocal bilateral embolic strokes, concerning for cardioembolic source.  MRA head and neck negative.  LDL 58, A1c 6.5.  EF 55 to 60%, UDS showed positive for THC.  Sodium 134, creatinine 1.12, hemoglobin 9.2.  On today's TEE, it showed large aortic valve mobile vegetation, associated moderate to severe aortic regurgitation.  On exam, patient intubated on fentanyl and propofol, eyes closed, not following commands. With forced eye opening, eyes in mid position, not blinking to visual threat, doll's eyes absent, not tracking, pupil equal size 3.5 mm, sluggish to light. Corneal reflex weakly present, gag and cough present. Breathing over the vent.  Facial symmetry not able to test due to ET tube.  Tongue protrusion not cooperative. On pain stimulation, no movement in all extremities. DTR 1+ and bilateral positive babinski. Sensation, coordination and gait not tested.  Etiology for patient stroke likely due to large aortic valve mobile vegetation.  However not sure if infectious vegetation versus non-infectious vegetation.  So far blood culture negative, no leukocytosis, afebrile, not supporting infectious vegetation.  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  Agree with ID consult.  May consider CVTS consult.  OK to continue heparin IV at this time, await ID input.  For detailed assessment and  plan, please refer to below as I have made changes wherever appropriate.   Marvel Plan, MD PhD Stroke Neurology 11/26/2020 6:02 PM    INTERVAL HISTORY No family member is at the bedside at time of this exam. Patient scheduled to have TEE shortly.   Vitals:   11/26/20 0440 11/26/20 0500 11/26/20 0600 11/26/20 0750  BP:  (!) 86/48 (!) 126/55 (!) 122/50  Pulse:  (!) 58 70 63  Resp:  16 17 18   Temp:  98.8 F (37.1 C) 98.6 F (37 C)   TempSrc:      SpO2:  95% 98% 96%  Weight: 77.9 kg     Height:       CBC:  Recent Labs  Lab 11/25/20 0341 11/26/20 0323  WBC 7.4 6.7  NEUTROABS 6.0 4.8  HGB 9.9* 9.2*  HCT 30.0* 28.1*  MCV 101.0* 100.7*  PLT 61* 61*   Basic Metabolic Panel:  Recent Labs  Lab 11/24/20 0357 11/24/20 1645 11/25/20 0341 11/26/20 0323  NA 132*  --  132* 134*  K 4.1  --  3.9 3.6  CL 106  --  104 102  CO2 16*  --  19* 23  GLUCOSE 178*  --  184* 110*  BUN 21  --  18 14  CREATININE 1.30*  --  1.12* 1.12*  CALCIUM 6.9*  --  7.4* 7.8*  MG 1.6* 1.9  --   --   PHOS 1.9* 2.8  --   --     Lipid Panel:  Recent Labs  Lab 11/25/20 1857 11/26/20 0323  CHOL 139  --   TRIG 111 109  HDL 59  --   CHOLHDL 2.4  --  VLDL 22  --   LDLCALC 58  --     HgbA1c:  Recent Labs  Lab 11/23/20 0244  HGBA1C 6.5*   Urine Drug Screen:  Recent Labs  Lab 10/30/2020 1720  LABOPIA NONE DETECTED  COCAINSCRNUR NONE DETECTED  LABBENZ POSITIVE*  AMPHETMU NONE DETECTED  THCU POSITIVE*  LABBARB NONE DETECTED    Alcohol Level No results for input(s): ETH in the last 168 hours.  IMAGING past 24 hours MR ANGIO HEAD WO CONTRAST  Result Date: 11/25/2020 CLINICAL DATA:  Neuro deficit, stroke suspected EXAM: MRI HEAD WITHOUT CONTRAST MRA HEAD WITHOUT CONTRAST MRA NECK WITHOUT AND WITH CONTRAST TECHNIQUE: Multiplanar, multi-echo pulse sequences of the brain and surrounding structures were acquired without intravenous contrast. Angiographic images of the Circle of Willis were  acquired using MRA technique without intravenous contrast. Angiographic images of the neck were acquired using MRA technique without and with intravenous contrast. Carotid stenosis measurements (when applicable) are obtained utilizing NASCET criteria, using the distal internal carotid diameter as the denominator. CONTRAST:  42mL GADAVIST GADOBUTROL 1 MMOL/ML IV SOLN COMPARISON:  And prior MRI, correlation is made with CT head 11/14/2020 FINDINGS: MRI HEAD FINDINGS Brain: Areas of restricted diffusion in the cortex and white matter bilateral frontal, parietal, occipital, posterior temporal lobes, as well as the bilateral cerebellar hemispheres. Additional punctate focus of restricted diffusion in the left caudate head. Some of this is in a watershed distribution (series 5, image 94), while other areas are more focal. The cortical areas are associated with gyral swelling (series 12, images 15 and 20, for example). Areas of curvilinear susceptibility in the right frontal lobe in the area of infarct (series 15, image 36), likely hemorrhage. No extra-axial fluid collection. No mass, mass effect, or midline shift. Remote left thalamic lacunar infarct. Vascular: See MRA, below. Skull and upper cervical spine: Normal marrow signal. Sinuses/Orbits: Air-fluid levels in the right greater than left maxillary sinus, with trace fluid in the left sphenoid sinus. NG tube seen in the nasal cavity. Partially visualized endotracheal tube in the oral cavity. The orbits are unremarkable. Other: Fluid in the bilateral mastoid air cells. MRA HEAD FINDINGS Anterior circulation: Both internal carotid arteries are patent to the termini, without stenosis or other abnormality; mildly decreased signal in the proximal cavernous carotid is felt to be artifactual. A1 segments patent. Anterior cerebral arteries are patent to their distal aspects. No significant M1 stenosis or occlusion. Normal MCA bifurcations. Distal MCA branches well perfused and  symmetric. Posterior circulation: Vertebral arteries patent to the vertebrobasilar junction without stenosis. Focal area of poor flow visualization in the basilar artery (series 5, image 73), which may be artifactual. The basilar is otherwise patent to its distal aspect. Superior cerebral arteries patent bilaterally. PCAs well perfused to their distal aspects without stenosis.Posterior communicating arteries are visualized bilaterally. Anatomic variants: None significant. MRA NECK FINDINGS Aortic arch: Normal three-vessel arch. No significant stenosis or aneurysm. Right carotid system: Patent, without hemodynamically significant stenosis, occlusion, or aneurysm. Plaque at the bifurcation. Left carotid system: Patent, without hemodynamically significant stenosis, occlusion, or aneurysm. Plaque at the bifurcation. Vertebral arteries: Limited visualization of the origin of the right vertebral artery secondary to artifact. The vertebral arteries are otherwise patent, without significant stenosis, occlusion, or aneurysm. Other: None IMPRESSION: 1. Multiple areas of restricted diffusion throughout the bilateral cerebral and cerebellar hemispheres, some of which are in a watershed pattern, while others are more likely to be embolic, given different vascular territories. Likely petechial hemorrhage in a right frontal  lobe cortical infarct, but no evidence of significant hemorrhagic transformation or midline shift. 2. No large vessel occlusion in the intracranial vasculature. 3. No hemodynamically significant stenosis in the neck. Multiple attempts were made to contact a provider taking care of this patient to convey these results. Electronically Signed   By: Wiliam Ke M.D.   On: 11/25/2020 17:32   MR ANGIO NECK W WO CONTRAST  Result Date: 11/25/2020 CLINICAL DATA:  Neuro deficit, stroke suspected EXAM: MRI HEAD WITHOUT CONTRAST MRA HEAD WITHOUT CONTRAST MRA NECK WITHOUT AND WITH CONTRAST TECHNIQUE: Multiplanar,  multi-echo pulse sequences of the brain and surrounding structures were acquired without intravenous contrast. Angiographic images of the Circle of Willis were acquired using MRA technique without intravenous contrast. Angiographic images of the neck were acquired using MRA technique without and with intravenous contrast. Carotid stenosis measurements (when applicable) are obtained utilizing NASCET criteria, using the distal internal carotid diameter as the denominator. CONTRAST:  49mL GADAVIST GADOBUTROL 1 MMOL/ML IV SOLN COMPARISON:  And prior MRI, correlation is made with CT head 10/24/2020 FINDINGS: MRI HEAD FINDINGS Brain: Areas of restricted diffusion in the cortex and white matter bilateral frontal, parietal, occipital, posterior temporal lobes, as well as the bilateral cerebellar hemispheres. Additional punctate focus of restricted diffusion in the left caudate head. Some of this is in a watershed distribution (series 5, image 94), while other areas are more focal. The cortical areas are associated with gyral swelling (series 12, images 15 and 20, for example). Areas of curvilinear susceptibility in the right frontal lobe in the area of infarct (series 15, image 36), likely hemorrhage. No extra-axial fluid collection. No mass, mass effect, or midline shift. Remote left thalamic lacunar infarct. Vascular: See MRA, below. Skull and upper cervical spine: Normal marrow signal. Sinuses/Orbits: Air-fluid levels in the right greater than left maxillary sinus, with trace fluid in the left sphenoid sinus. NG tube seen in the nasal cavity. Partially visualized endotracheal tube in the oral cavity. The orbits are unremarkable. Other: Fluid in the bilateral mastoid air cells. MRA HEAD FINDINGS Anterior circulation: Both internal carotid arteries are patent to the termini, without stenosis or other abnormality; mildly decreased signal in the proximal cavernous carotid is felt to be artifactual. A1 segments patent.  Anterior cerebral arteries are patent to their distal aspects. No significant M1 stenosis or occlusion. Normal MCA bifurcations. Distal MCA branches well perfused and symmetric. Posterior circulation: Vertebral arteries patent to the vertebrobasilar junction without stenosis. Focal area of poor flow visualization in the basilar artery (series 5, image 73), which may be artifactual. The basilar is otherwise patent to its distal aspect. Superior cerebral arteries patent bilaterally. PCAs well perfused to their distal aspects without stenosis.Posterior communicating arteries are visualized bilaterally. Anatomic variants: None significant. MRA NECK FINDINGS Aortic arch: Normal three-vessel arch. No significant stenosis or aneurysm. Right carotid system: Patent, without hemodynamically significant stenosis, occlusion, or aneurysm. Plaque at the bifurcation. Left carotid system: Patent, without hemodynamically significant stenosis, occlusion, or aneurysm. Plaque at the bifurcation. Vertebral arteries: Limited visualization of the origin of the right vertebral artery secondary to artifact. The vertebral arteries are otherwise patent, without significant stenosis, occlusion, or aneurysm. Other: None IMPRESSION: 1. Multiple areas of restricted diffusion throughout the bilateral cerebral and cerebellar hemispheres, some of which are in a watershed pattern, while others are more likely to be embolic, given different vascular territories. Likely petechial hemorrhage in a right frontal lobe cortical infarct, but no evidence of significant hemorrhagic transformation or midline shift. 2. No  large vessel occlusion in the intracranial vasculature. 3. No hemodynamically significant stenosis in the neck. Multiple attempts were made to contact a provider taking care of this patient to convey these results. Electronically Signed   By: Wiliam Ke M.D.   On: 11/25/2020 17:32   MR BRAIN WO CONTRAST  Result Date: 11/25/2020 CLINICAL  DATA:  Neuro deficit, stroke suspected EXAM: MRI HEAD WITHOUT CONTRAST MRA HEAD WITHOUT CONTRAST MRA NECK WITHOUT AND WITH CONTRAST TECHNIQUE: Multiplanar, multi-echo pulse sequences of the brain and surrounding structures were acquired without intravenous contrast. Angiographic images of the Circle of Willis were acquired using MRA technique without intravenous contrast. Angiographic images of the neck were acquired using MRA technique without and with intravenous contrast. Carotid stenosis measurements (when applicable) are obtained utilizing NASCET criteria, using the distal internal carotid diameter as the denominator. CONTRAST:  98mL GADAVIST GADOBUTROL 1 MMOL/ML IV SOLN COMPARISON:  And prior MRI, correlation is made with CT head 2020/12/21 FINDINGS: MRI HEAD FINDINGS Brain: Areas of restricted diffusion in the cortex and white matter bilateral frontal, parietal, occipital, posterior temporal lobes, as well as the bilateral cerebellar hemispheres. Additional punctate focus of restricted diffusion in the left caudate head. Some of this is in a watershed distribution (series 5, image 94), while other areas are more focal. The cortical areas are associated with gyral swelling (series 12, images 15 and 20, for example). Areas of curvilinear susceptibility in the right frontal lobe in the area of infarct (series 15, image 36), likely hemorrhage. No extra-axial fluid collection. No mass, mass effect, or midline shift. Remote left thalamic lacunar infarct. Vascular: See MRA, below. Skull and upper cervical spine: Normal marrow signal. Sinuses/Orbits: Air-fluid levels in the right greater than left maxillary sinus, with trace fluid in the left sphenoid sinus. NG tube seen in the nasal cavity. Partially visualized endotracheal tube in the oral cavity. The orbits are unremarkable. Other: Fluid in the bilateral mastoid air cells. MRA HEAD FINDINGS Anterior circulation: Both internal carotid arteries are patent to the  termini, without stenosis or other abnormality; mildly decreased signal in the proximal cavernous carotid is felt to be artifactual. A1 segments patent. Anterior cerebral arteries are patent to their distal aspects. No significant M1 stenosis or occlusion. Normal MCA bifurcations. Distal MCA branches well perfused and symmetric. Posterior circulation: Vertebral arteries patent to the vertebrobasilar junction without stenosis. Focal area of poor flow visualization in the basilar artery (series 5, image 73), which may be artifactual. The basilar is otherwise patent to its distal aspect. Superior cerebral arteries patent bilaterally. PCAs well perfused to their distal aspects without stenosis.Posterior communicating arteries are visualized bilaterally. Anatomic variants: None significant. MRA NECK FINDINGS Aortic arch: Normal three-vessel arch. No significant stenosis or aneurysm. Right carotid system: Patent, without hemodynamically significant stenosis, occlusion, or aneurysm. Plaque at the bifurcation. Left carotid system: Patent, without hemodynamically significant stenosis, occlusion, or aneurysm. Plaque at the bifurcation. Vertebral arteries: Limited visualization of the origin of the right vertebral artery secondary to artifact. The vertebral arteries are otherwise patent, without significant stenosis, occlusion, or aneurysm. Other: None IMPRESSION: 1. Multiple areas of restricted diffusion throughout the bilateral cerebral and cerebellar hemispheres, some of which are in a watershed pattern, while others are more likely to be embolic, given different vascular territories. Likely petechial hemorrhage in a right frontal lobe cortical infarct, but no evidence of significant hemorrhagic transformation or midline shift. 2. No large vessel occlusion in the intracranial vasculature. 3. No hemodynamically significant stenosis in the neck. Multiple attempts  were made to contact a provider taking care of this patient to  convey these results. Electronically Signed   By: Wiliam Ke M.D.   On: 11/25/2020 17:32    PHYSICAL EXAM  Mental Status: Patient is asleep initially, wakes easily to voice.  Intubated with minimal sedation.   She nods yes and no appropriately    Cranial Nerves:  II: PERRL 3 mm/brisk.   III, IV, VI: she has a slightly dysconjugate gaze with right eye slightly right of midline and left eye at midline.  she is able to cross midline at time of exam.   She has occasional spontaneous leftward eye movements within a midline gaze.  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   V: Sensation is intact to light touch and symmetrical to face. Blinks to threat on the right with inconsistent blink to threat on the left for NP, opposite for MD. Patient consistently indicates that she is not having visual deficits nor trouble moving her eyes, though it is clear she cannot orient to stimuli.  VII: Face appears symmetric resting and with movement within the limitation of ETT placement.  VIII: Hearing is intact to voice IX, X:Patient has a spontaneous cough and gag.  XI: Shoulder shrug with increased elevation on the left.  Motor: 5/5 strength present in bilateral lower extremities without vertical drift.  Left upper extremity strength is also 5/5 without pronator drift.  Right upper extremity does have some antigravity movement but is unable to lift more than 10 degrees off of the bed. She is able to move her RUE laterally with vertical drift on assessment.  Right hand grip strength significantly weak with minimal right hand movement and 1/5 strength, left grip strength 5/5.  Tone is normal. Bulk is normal.   Sensation: Intact to light touch bilaterally in all four extremities.   Coordination: Unable to assess, patient has difficulty following multi-step commands   DTRs: 3+ and symmetric patellae, biceps, and brachioradialis throughout.   Plantars: Toes upgoing bilaterally Gait:  Deferred    ASSESSMENT/PLAN  Samantha Cooley is a 60 y.o. female with a medical history significant for essential hypertension, type 2 diabetes mellitus, anxiety, depression, and insomnia who presented to the ED 9/30 following an outpatient PEA arrest. Patient was found by her significant other the afternoon of 9/30 unresponsive after speaking to him on the telephone in her normal state of health approximately 50 minutes prior to being found unresponsive. Her significant other initiated CPR and EMS arrived with initial PEA rhythm. EMS initiated CPR and achieved ROSC in approximately 15 minutes. Initial work up revealed an anion gap acidosis, elevated lactate, hyperglycemia, leukocytosis, elevated troponin, respiratory acidosis, and hypoxia with chest x-ray findings significant for bilateral infiltrates. Patient does follow simple commands while intubated, however, there have been concerns for right hand and right upper extremity weakness since the morning of 10/1 with some documentation reporting no movement of the right arm and no response to pain. Due to ongoing concern for unilateral weakness, neurology was consulted for further evaluation.   -- MRI brain reveals multiple areas of restricted diffusion throughout the bilateral cerebral and cerebellar hemispheres, some which are in a watershed pattern, while others are more likely to be embolic, given the different vascular territories. There is also likely petechial hemorrhage in the right frontal lobe cortical infarct without evidence of significant hemorrhagic transformation or midline shift.  - Vessel imaging is without LVO or hemodynamically significant stenosis in the neck on MR angio.  -  Presentation is concerning for an acute to subacute infarctions in the setting of impaired cerebral blood flow with hypoxia 2/2 cardiac arrest with a watershed appearance on MRI imaging versus cardioembolic 2/2 cardiac arrest due to embolic appearing infarctions on  MRI review.   TEE TODAY SHOWS A MASS AT AORTIC VALVE, SUSPICIOUS FOR VEGETATION. WILL CONTINUE HEPARIN DRIP IF OKAY WITH ID.   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    MRI   brain: 11/25/20 Areas of restricted diffusion in the cortex and white matter bilateral frontal, parietal, occipital, posterior temporal lobes, as well as the bilateral cerebellar hemispheres. Additional punctate focus of restricted diffusion in the left caudate head. Some of this is in a watershed distribution (series 5, image 94), while other areas are more focal. The cortical areas are associated with gyral swelling (series 12, images 15 and 20, for example). Areas of curvilinear susceptibility in the right frontal lobe in the area of infarct (series 15, image 36), likely hemorrhage  MRA   Head:  Anterior circulation: Both internal carotid arteries are patent to the termini, without stenosis or other abnormality; mildly decreased signal in the proximal cavernous carotid is felt to be artifactual. A1 segments patent. Anterior cerebral arteries are patent to their distal aspects. No significant M1 stenosis or occlusion. Normal MCA bifurcations. Distal MCA branches well perfused and symmetric.   Posterior circulation: Vertebral arteries patent to the vertebrobasilar junction without stenosis  MRA NECK: No hemodynamically significant stenosis in the neck 2D Echo  11/23/2020 Left Ventricle: No mural thrombus - filling defects in the LV cavity noted with Definity contrast, normal size LA TEE 11/26/2020: 1.5 x 0.5 cm mobile mass associated with the non-coronary cusp of the aortic valve, suspicious for vegetation. There is associated moderate to probable severe aortic regurgitation (eccentric, multiple  jets). No LAA thrombus Negative for PFO by color doppler and saline microbubble contrast Moderate concentric LVH LVEF 55-60% LDL 58 HgbA1c 6.5 VTE prophylaxis - scd    There are no active orders of the following types: Diet, Nourishments.   No antithrombotic prior to admission, now on heparin IV.   Therapy recommendations:  pending Disposition:  pending   Hypertension Home meds:  metoprolol, spironolactone/hctz Stable Permissive hypertension (OK if < 220/120) but gradually normalize in 5-7 days Long-term BP goal normotensive  Hyperlipidemia Home meds:  none, resumed in hospital LDL 58, goal < 70  HgbA1c 6.5, goal < 7.0 CBGs Recent Labs    11/26/20 0013 11/26/20 0313 11/26/20 0811  GLUCAP 84 92 108*    SSI  Other Stroke Risk Factors      Substance abuse - UDS:  THC POSITIVE, Cocaine NONE DETECTED. Patient advised to stop using due to stroke risk.  Obesity, Body mass index is 29.48 kg/m., BMI >/= 30 associated with increased stroke risk, recommend weight loss, diet and exercise as appropriate    Hospital day # 4     To contact Stroke Continuity provider, please refer to [email or web address]. After hours, contact General Neurology

## 2020-11-26 NOTE — Progress Notes (Signed)
NAME:  Samantha Cooley, MRN:  [id number], DOB:  07/07/1960, LOS: 4 ADMISSION DATE:  11/15/2020, CONSULTATION DATE:  11/10/2020 REFERRING MD:  Horton - EDP CHIEF COMPLAINT:  Cardiac arrest    History of Present Illness:  60 year old woman who presented to [facility] ED via EMS 9/30 status post PEA arrest.  PMHx significant for HTN, T2DM, anxiety, depression, insomnia.  History obtained in the emergency room. Patient was her usual state of health; she had been on phone with her significant other around 2 PM. Found at 2:50PM unresponsive and pulseless by significant other.  EMS was called and significant other initiated CPR.  EMS arrival noted to be around 2:55PM. Initial rhythm PEA.  ROSC ~15 minutes.   On arrival to the emergency room, patient was intubated, requiring high FiO2. Initially hypertensive, improved with Fentanyl.  Patient would open eyes, stick her tongue out, had equal muscle tone bilaterally and would lift arms spontaneously but could not follow commands with lower extremities.  Preliminary evaluation in the ER: Glucose 372 White blood cell count 18.3 creatinine 1.75 anion gap metabolic acidosis with serum bicarbonate of 11, lactate of greater than 9, troponin 588. Bedside ultrasound was negative for pericardial effusion RV function normal, LV function difficult to assess. Seen by Cardiology, did not think this was consistent with STEMI.  PCCM admitted to ICU for further management post-arrest.  Pertinent Medical History:  HTN, diabetes, anxiety, depression, insomnia.  Significant Hospital Events: Including procedures, antibiotic start and stop dates in addition to other pertinent events   9/30 admitted s/p arrest, intubated in field, seen by cardiology and felt not primary ST elevation event.  Suspected more metabolically driven.  On presentation had severe anion gap metabolic acidosis, hyperglycemia, and encephalopathy but was following some simple commands such as sticking tongue out and  nodding appropriately. Vanc/zosyn started. CT head neck and chest ordered.  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 10/2 Troponin uptrended to 9756, heparin gtt started for ?ACS. 10/3 Cardiology consult for ?ACS, further recs. New RUE weakness/neglect, MRI Brain with multiple areas of stroke (cardioembolic vs. watershed), no large vessel occlusion, Neuro consult. 10/4 TEE per Cardiology, stable neuro exam. Plan for wean, ?extubation post-TEE.  Interim History / Subjective:  No significant events overnight MRI Brain with multiple areas of stroke TEE today to assess for cardioembolic cause of stroke Remains heavily sedated in anticipation of TEE Post-TEE, plan for wean/?extubate  Objective:  Blood pressure (!) 122/50, pulse 63, temperature 98.6 F (37 C), resp. rate 18, height 5\' 4"  (1.626 m), weight 77.9 kg, SpO2 96 %.    Vent Mode: CPAP;PSV FiO2 (%):  [40 %] 40 % Set Rate:  [16 bmp] 16 bmp Vt Set:  [440 mL] 440 mL PEEP:  [5 cmH20-8 cmH20] 5 cmH20 Pressure Support:  [15 cmH20] 15 cmH20 Plateau Pressure:  [16 cmH20-23 cmH20] 20 cmH20   Intake/Output Summary (Last 24 hours) at 11/26/2020 0850 Last data filed at 11/26/2020 0600 Gross per 24 hour  Intake 1136.02 ml  Output 1061 ml  Net 75.02 ml    Filed Weights   11/23/20 0500 11/24/20 0500 11/26/20 0440  Weight: 72.7 kg 80 kg 77.9 kg   Physical Examination: General: Chronically ill-appearing middle-aged woman in NAD. HEENT: [place]/AT, anicteric sclera, PERRL, moist mucous membranes. Neuro: Sedated. (In the setting of TEE). Responds to noxious stimuli. Not following commands. RUE neglect. CV: RRR, no m/g/r. PULM: Breathing even and unlabored on vent (PSV 15/5). Lung fields CTAB. GI: Soft, nontender,mildly distended.  Hypoactive bowel sounds. Extremities: Bilateral symmetric 1+ LE edema noted. Skin: Warm/dry, no rashes.  Resolved Hospital Problem List:     Assessment & Plan:   PEA cardiac arrest Undifferentiated  shock likely distributive following cardiac arrest Possible ACS Possible component of stress cardiomyopathy causing cardiogenic shock, requiring titration of norepinephrine and dobutamine. Initially seen by Dr. Eldridge Dace on admission for STEMI r/o; was not felt to be STEMI at that time (troponin 588). Troponin uptrend  to 9756 10/2AM, prompting heparin gtt initiation for ?ACS. 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 - Cardiology following, appreciate recs - TEE today, 10/4 - Heparin gtt, continue pending TEE results - Intermittent NE for goal MAP > 65  Acute hypoxic respiratory failure and aspiration pneumonia requiring mechanical ventilation - Weaning 10/4AM, hope to extubate post-TEE today 10/4 - Wean FiO2 for O2 sat > 90% - Daily WUA/SBT - VAP bundle - Continue bronchodilators - Diuresis for optimization of volume status - Pulmonary hygiene - PAD protocol for sedation: Propofol and Fentanyl for goal RASS 0 to -1 (post-TEE)  Multiple strokes, suspect cardioembolic etiology Hypoxic ischemic encephalopathy Unilateral RUE weakness Appears to be resolving, able to wake up and follow commands, nodding appropriately to questions. Concern with inability to move RUE; able to move BLE and LUE on exam. CT Head 9/30 NAICA on admission. MRI Brain with multiple areas of ischemic stroke, cardioembolic etiology vs. watershed infarct. MRA Head/Neck without large vessel occlusion. - Neuro following, appreciate recommendations - Stroke prophylaxis, on heparin gtt - Continue heparin until TEE results, if negative for intracardiac thrombus can change to antiplatelet therapy - Frequent neuro checks - Goal MAP > 65, avoid hypotension - Neuroprotective measures: HOB > 30 degrees, normoglycemia, normothermia, electrolytes WNL - PT/OT/SLP post-extubation  T2DM Stress hyperglycemia Beta-hydroxybutyrate negative. History of T2DM, home regimen includes metformin. - CBGs Q4H - Levemit Q12H -  SSI  AKI, improving Suspect secondary to volume depletion and organ hypoperfusion. - Trend BMP - Replete electrolytes as indicated - Monitor I&Os in the setting of diuresis - Avoid nephrotoxic agents as able - Ensure adequate renal perfusion  Best Practice: (right click and "Reselect all SmartList Selections" daily)   Diet/type: NPO DVT prophylaxis: SCD GI prophylaxis: PPI Lines: N/A Foley:  Yes, and it is still needed Code Status:  full code Last date of multidisciplinary goals of care discussion [Pending]  Critical care time: 38 minutes   Tim Lair, PA-C [facility] 11/26/20 8:50 AM

## 2020-11-26 NOTE — Progress Notes (Signed)
PCCM Interval Progress Note  TEE completed by Dr. Rennis Golden demonstrating mobile mass 0.5cm x 1.5cm on the non-coronary cusp of the aortic valve, suspicious for vegetation. ID consult placed and vancomycin/ceftriaxone initiated for possible endocarditis. TCTS consulted to assess surgical candidacy for valve replacement in the setting of significant AI.  Family updated via phone.  Tim Lair, PA-C [facility] 11/26/20 1:40 PM  Please see [email or web address] for pager details.  From 7A-7P if no response, please call [phone] After hours, please call ELink [phone].

## 2020-11-26 NOTE — Progress Notes (Signed)
Progress Note  Patient Name: Samantha Cooley Date of Encounter: 11/26/2020  [facility] Cardiologist: Elouise Munroe, MD   Subjective   Patient awake on vent. Right hemiparesis.   Inpatient Medications    Scheduled Meds:  chlorhexidine gluconate (MEDLINE KIT)  15 mL Mouth Rinse BID   Chlorhexidine Gluconate Cloth  6 each Topical Daily   docusate  100 mg Per Tube BID   escitalopram  20 mg Per Tube QHS   feeding supplement (PROSource TF)  45 mL Per Tube TID   free water  30 mL Per Tube Q4H   insulin aspart  2-6 Units Subcutaneous Q4H   insulin detemir  12 Units Subcutaneous Q12H   ipratropium-albuterol  3 mL Nebulization BID   mouth rinse  15 mL Mouth Rinse 10 times per day   pantoprazole (PROTONIX) IV  40 mg Intravenous Q24H   polyethylene glycol  17 g Per Tube Daily   sodium chloride flush  10-40 mL Intracatheter Q12H   Continuous Infusions:  sodium chloride Stopped (11/25/20 2155)   sodium chloride     sodium chloride     dextrose 5 % and 0.9% NaCl 50 mL/hr at 11/26/20 0600   feeding supplement (VITAL HIGH PROTEIN) 1,000 mL (11/24/20 2039)   fentaNYL infusion INTRAVENOUS 175 mcg/hr (11/26/20 0600)   heparin 1,300 Units/hr (11/26/20 0600)   norepinephrine (LEVOPHED) Adult infusion Stopped (11/25/20 1802)   propofol (DIPRIVAN) infusion Stopped (11/25/20 1731)   PRN Meds: acetaminophen (TYLENOL) oral liquid 160 mg/5 mL, dextrose, fentaNYL, hydrALAZINE, LORazepam, midazolam, sodium chloride flush   Vital Signs    Vitals:   11/26/20 0400 11/26/20 0440 11/26/20 0500 11/26/20 0600  BP: (!) 107/47  (!) 86/48 (!) 126/55  Pulse: 66  (!) 58 70  Resp: 19  16 17   Temp: 99.3 F (37.4 C)  98.8 F (37.1 C) 98.6 F (37 C)  TempSrc: Bladder     SpO2: 96%  95% 98%  Weight:  77.9 kg    Height:        Intake/Output Summary (Last 24 hours) at 11/26/2020 0744 Last data filed at 11/26/2020 0600 Gross per 24 hour  Intake 1209.02 ml  Output 1061 ml  Net 148.02 ml   Last  3 Weights 11/26/2020 11/24/2020 11/23/2020  Weight (lbs) 171 lb 11.8 oz 176 lb 5.9 oz 160 lb 4.4 oz  Weight (kg) 77.9 kg 80 kg 72.7 kg      Telemetry    NSR - Personally Reviewed  ECG    NSR with LBBB - Personally Reviewed  Physical Exam   GEN: No acute distress.  On vent Neck: No JVD Cardiac: RRR, no murmurs, rubs, or gallops.  Respiratory: Clear to auscultation bilaterally. GI: Soft, nontender, distended MS: No edema; No deformity. Neuro:  right hemiparesis Psych: Normal affect   Labs    High Sensitivity Troponin:   Recent Labs  Lab 11/15/2020 1610 11/14/2020 2020 11/23/20 0758 11/24/20 0357  TROPONINIHS 588* 1,684* 9,756* 2,818*     Chemistry Recent Labs  Lab 11/23/20 1700 11/24/20 0357 11/24/20 1645 11/25/20 0341 11/26/20 0323  NA  --  132*  --  132* 134*  K  --  4.1  --  3.9 3.6  CL  --  106  --  104 102  CO2  --  16*  --  19* 23  GLUCOSE  --  178*  --  184* 110*  BUN  --  21  --  18 14  CREATININE  --  1.30*  --  1.12* 1.12*  CALCIUM  --  6.9*  --  7.4* 7.8*  MG 2.1 1.6* 1.9  --   --   PROT  --  4.3*  --  4.8* 5.1*  ALBUMIN  --  2.3*  --  2.4* 2.3*  AST  --  51*  --  31 21  ALT  --  48*  --  33 24  ALKPHOS  --  70  --  81 80  BILITOT  --  0.7  --  0.7 0.8  GFRNONAA  --  46*  --  56* 56*  ANIONGAP  --  10  --  9 9    Lipids  Recent Labs  Lab 11/25/20 1857 11/26/20 0323  CHOL 139  --   TRIG 111 109  HDL 59  --   LDLCALC 58  --   CHOLHDL 2.4  --     Hematology Recent Labs  Lab 11/24/20 2134 11/25/20 0341 11/26/20 0323  WBC 8.8 7.4 6.7  RBC 3.14* 2.97* 2.79*  HGB 10.5* 9.9* 9.2*  HCT 32.9* 30.0* 28.1*  MCV 104.8* 101.0* 100.7*  MCH 33.4 33.3 33.0  MCHC 31.9 33.0 32.7  RDW 13.9 13.8 13.8  PLT 61* 61* 61*   Thyroid No results for input(s): TSH, FREET4 in the last 168 hours.  BNPNo results for input(s): BNP, PROBNP in the last 168 hours.  DDimer No results for input(s): DDIMER in the last 168 hours.   Radiology    MR ANGIO HEAD WO  CONTRAST  Result Date: 11/25/2020 CLINICAL DATA:  Neuro deficit, stroke suspected EXAM: MRI HEAD WITHOUT CONTRAST MRA HEAD WITHOUT CONTRAST MRA NECK WITHOUT AND WITH CONTRAST TECHNIQUE: Multiplanar, multi-echo pulse sequences of the brain and surrounding structures were acquired without intravenous contrast. Angiographic images of the Circle of Willis were acquired using MRA technique without intravenous contrast. Angiographic images of the neck were acquired using MRA technique without and with intravenous contrast. Carotid stenosis measurements (when applicable) are obtained utilizing NASCET criteria, using the distal internal carotid diameter as the denominator. CONTRAST:  44m GADAVIST GADOBUTROL 1 MMOL/ML IV SOLN COMPARISON:  And prior MRI, correlation is made with CT head 11/21/2020 FINDINGS: MRI HEAD FINDINGS Brain: Areas of restricted diffusion in the cortex and white matter bilateral frontal, parietal, occipital, posterior temporal lobes, as well as the bilateral cerebellar hemispheres. Additional punctate focus of restricted diffusion in the left caudate head. Some of this is in a watershed distribution (series 5, image 94), while other areas are more focal. The cortical areas are associated with gyral swelling (series 12, images 15 and 20, for example). Areas of curvilinear susceptibility in the right frontal lobe in the area of infarct (series 15, image 36), likely hemorrhage. No extra-axial fluid collection. No mass, mass effect, or midline shift. Remote left thalamic lacunar infarct. Vascular: See MRA, below. Skull and upper cervical spine: Normal marrow signal. Sinuses/Orbits: Air-fluid levels in the right greater than left maxillary sinus, with trace fluid in the left sphenoid sinus. NG tube seen in the nasal cavity. Partially visualized endotracheal tube in the oral cavity. The orbits are unremarkable. Other: Fluid in the bilateral mastoid air cells. MRA HEAD FINDINGS Anterior circulation: Both  internal carotid arteries are patent to the termini, without stenosis or other abnormality; mildly decreased signal in the proximal cavernous carotid is felt to be artifactual. A1 segments patent. Anterior cerebral arteries are patent to their distal aspects. No significant M1 stenosis or occlusion. Normal MCA bifurcations.  Distal MCA branches well perfused and symmetric. Posterior circulation: Vertebral arteries patent to the vertebrobasilar junction without stenosis. Focal area of poor flow visualization in the basilar artery (series 5, image 73), which may be artifactual. The basilar is otherwise patent to its distal aspect. Superior cerebral arteries patent bilaterally. PCAs well perfused to their distal aspects without stenosis.Posterior communicating arteries are visualized bilaterally. Anatomic variants: None significant. MRA NECK FINDINGS Aortic arch: Normal three-vessel arch. No significant stenosis or aneurysm. Right carotid system: Patent, without hemodynamically significant stenosis, occlusion, or aneurysm. Plaque at the bifurcation. Left carotid system: Patent, without hemodynamically significant stenosis, occlusion, or aneurysm. Plaque at the bifurcation. Vertebral arteries: Limited visualization of the origin of the right vertebral artery secondary to artifact. The vertebral arteries are otherwise patent, without significant stenosis, occlusion, or aneurysm. Other: None IMPRESSION: 1. Multiple areas of restricted diffusion throughout the bilateral cerebral and cerebellar hemispheres, some of which are in a watershed pattern, while others are more likely to be embolic, given different vascular territories. Likely petechial hemorrhage in a right frontal lobe cortical infarct, but no evidence of significant hemorrhagic transformation or midline shift. 2. No large vessel occlusion in the intracranial vasculature. 3. No hemodynamically significant stenosis in the neck. Multiple attempts were made to  contact a provider taking care of this patient to convey these results. Electronically Signed   By: Merilyn Baba M.D.   On: 11/25/2020 17:32   MR ANGIO NECK W WO CONTRAST  Result Date: 11/25/2020 CLINICAL DATA:  Neuro deficit, stroke suspected EXAM: MRI HEAD WITHOUT CONTRAST MRA HEAD WITHOUT CONTRAST MRA NECK WITHOUT AND WITH CONTRAST TECHNIQUE: Multiplanar, multi-echo pulse sequences of the brain and surrounding structures were acquired without intravenous contrast. Angiographic images of the Circle of Willis were acquired using MRA technique without intravenous contrast. Angiographic images of the neck were acquired using MRA technique without and with intravenous contrast. Carotid stenosis measurements (when applicable) are obtained utilizing NASCET criteria, using the distal internal carotid diameter as the denominator. CONTRAST:  38m GADAVIST GADOBUTROL 1 MMOL/ML IV SOLN COMPARISON:  And prior MRI, correlation is made with CT head 11/20/2020 FINDINGS: MRI HEAD FINDINGS Brain: Areas of restricted diffusion in the cortex and white matter bilateral frontal, parietal, occipital, posterior temporal lobes, as well as the bilateral cerebellar hemispheres. Additional punctate focus of restricted diffusion in the left caudate head. Some of this is in a watershed distribution (series 5, image 94), while other areas are more focal. The cortical areas are associated with gyral swelling (series 12, images 15 and 20, for example). Areas of curvilinear susceptibility in the right frontal lobe in the area of infarct (series 15, image 36), likely hemorrhage. No extra-axial fluid collection. No mass, mass effect, or midline shift. Remote left thalamic lacunar infarct. Vascular: See MRA, below. Skull and upper cervical spine: Normal marrow signal. Sinuses/Orbits: Air-fluid levels in the right greater than left maxillary sinus, with trace fluid in the left sphenoid sinus. NG tube seen in the nasal cavity. Partially visualized  endotracheal tube in the oral cavity. The orbits are unremarkable. Other: Fluid in the bilateral mastoid air cells. MRA HEAD FINDINGS Anterior circulation: Both internal carotid arteries are patent to the termini, without stenosis or other abnormality; mildly decreased signal in the proximal cavernous carotid is felt to be artifactual. A1 segments patent. Anterior cerebral arteries are patent to their distal aspects. No significant M1 stenosis or occlusion. Normal MCA bifurcations. Distal MCA branches well perfused and symmetric. Posterior circulation: Vertebral arteries patent to the vertebrobasilar  junction without stenosis. Focal area of poor flow visualization in the basilar artery (series 5, image 73), which may be artifactual. The basilar is otherwise patent to its distal aspect. Superior cerebral arteries patent bilaterally. PCAs well perfused to their distal aspects without stenosis.Posterior communicating arteries are visualized bilaterally. Anatomic variants: None significant. MRA NECK FINDINGS Aortic arch: Normal three-vessel arch. No significant stenosis or aneurysm. Right carotid system: Patent, without hemodynamically significant stenosis, occlusion, or aneurysm. Plaque at the bifurcation. Left carotid system: Patent, without hemodynamically significant stenosis, occlusion, or aneurysm. Plaque at the bifurcation. Vertebral arteries: Limited visualization of the origin of the right vertebral artery secondary to artifact. The vertebral arteries are otherwise patent, without significant stenosis, occlusion, or aneurysm. Other: None IMPRESSION: 1. Multiple areas of restricted diffusion throughout the bilateral cerebral and cerebellar hemispheres, some of which are in a watershed pattern, while others are more likely to be embolic, given different vascular territories. Likely petechial hemorrhage in a right frontal lobe cortical infarct, but no evidence of significant hemorrhagic transformation or midline  shift. 2. No large vessel occlusion in the intracranial vasculature. 3. No hemodynamically significant stenosis in the neck. Multiple attempts were made to contact a provider taking care of this patient to convey these results. Electronically Signed   By: Merilyn Baba M.D.   On: 11/25/2020 17:32   MR BRAIN WO CONTRAST  Result Date: 11/25/2020 CLINICAL DATA:  Neuro deficit, stroke suspected EXAM: MRI HEAD WITHOUT CONTRAST MRA HEAD WITHOUT CONTRAST MRA NECK WITHOUT AND WITH CONTRAST TECHNIQUE: Multiplanar, multi-echo pulse sequences of the brain and surrounding structures were acquired without intravenous contrast. Angiographic images of the Circle of Willis were acquired using MRA technique without intravenous contrast. Angiographic images of the neck were acquired using MRA technique without and with intravenous contrast. Carotid stenosis measurements (when applicable) are obtained utilizing NASCET criteria, using the distal internal carotid diameter as the denominator. CONTRAST:  79m GADAVIST GADOBUTROL 1 MMOL/ML IV SOLN COMPARISON:  And prior MRI, correlation is made with CT head 11/13/2020 FINDINGS: MRI HEAD FINDINGS Brain: Areas of restricted diffusion in the cortex and white matter bilateral frontal, parietal, occipital, posterior temporal lobes, as well as the bilateral cerebellar hemispheres. Additional punctate focus of restricted diffusion in the left caudate head. Some of this is in a watershed distribution (series 5, image 94), while other areas are more focal. The cortical areas are associated with gyral swelling (series 12, images 15 and 20, for example). Areas of curvilinear susceptibility in the right frontal lobe in the area of infarct (series 15, image 36), likely hemorrhage. No extra-axial fluid collection. No mass, mass effect, or midline shift. Remote left thalamic lacunar infarct. Vascular: See MRA, below. Skull and upper cervical spine: Normal marrow signal. Sinuses/Orbits: Air-fluid  levels in the right greater than left maxillary sinus, with trace fluid in the left sphenoid sinus. NG tube seen in the nasal cavity. Partially visualized endotracheal tube in the oral cavity. The orbits are unremarkable. Other: Fluid in the bilateral mastoid air cells. MRA HEAD FINDINGS Anterior circulation: Both internal carotid arteries are patent to the termini, without stenosis or other abnormality; mildly decreased signal in the proximal cavernous carotid is felt to be artifactual. A1 segments patent. Anterior cerebral arteries are patent to their distal aspects. No significant M1 stenosis or occlusion. Normal MCA bifurcations. Distal MCA branches well perfused and symmetric. Posterior circulation: Vertebral arteries patent to the vertebrobasilar junction without stenosis. Focal area of poor flow visualization in the basilar artery (series 5, image 73),  which may be artifactual. The basilar is otherwise patent to its distal aspect. Superior cerebral arteries patent bilaterally. PCAs well perfused to their distal aspects without stenosis.Posterior communicating arteries are visualized bilaterally. Anatomic variants: None significant. MRA NECK FINDINGS Aortic arch: Normal three-vessel arch. No significant stenosis or aneurysm. Right carotid system: Patent, without hemodynamically significant stenosis, occlusion, or aneurysm. Plaque at the bifurcation. Left carotid system: Patent, without hemodynamically significant stenosis, occlusion, or aneurysm. Plaque at the bifurcation. Vertebral arteries: Limited visualization of the origin of the right vertebral artery secondary to artifact. The vertebral arteries are otherwise patent, without significant stenosis, occlusion, or aneurysm. Other: None IMPRESSION: 1. Multiple areas of restricted diffusion throughout the bilateral cerebral and cerebellar hemispheres, some of which are in a watershed pattern, while others are more likely to be embolic, given different  vascular territories. Likely petechial hemorrhage in a right frontal lobe cortical infarct, but no evidence of significant hemorrhagic transformation or midline shift. 2. No large vessel occlusion in the intracranial vasculature. 3. No hemodynamically significant stenosis in the neck. Multiple attempts were made to contact a provider taking care of this patient to convey these results. Electronically Signed   By: Merilyn Baba M.D.   On: 11/25/2020 17:32   DG CHEST PORT 1 VIEW  Result Date: 11/24/2020 CLINICAL DATA:  Pulmonary edema EXAM: PORTABLE CHEST 1 VIEW COMPARISON:  November 23, 2020 FINDINGS: The ETT is in good position. The NG tube terminates below today's film. No pneumothorax. Bilateral pleural effusions with underlying atelectasis, mildly more pronounced in the interval. No change in the cardiomediastinal silhouette. No other abnormalities. IMPRESSION: Bilateral pleural effusions with underlying atelectasis are mildly more pronounced in the interval. No other acute abnormalities or changes. Electronically Signed   By: Dorise Bullion III M.D.   On: 11/24/2020 12:37    Cardiac Studies   Echo: IMPRESSIONS     1. No mural thrombus - filling defects in the LV cavity noted with  Definity contrast, suspect related to hypertrophied papillary muscle and  subchordal structures. There also appears to be color flow across the  basal septum concerning for a membranous  VSD or possible eccentric AI jet. Left ventricular ejection fraction, by  estimation, is 55 to 60%. The left ventricle has normal function. The left  ventricle has no regional wall motion abnormalities. There is moderate  left ventricular hypertrophy. Left  ventricular diastolic parameters are consistent with Grade I diastolic  dysfunction (impaired relaxation).   2. Right ventricular systolic function is low normal. The right  ventricular size is normal.   3. The mitral valve was not well visualized. No evidence of mitral valve   regurgitation.   4. The aortic valve was not well visualized. There is moderate  calcification of the aortic valve. Aortic valve regurgitation is mild.  Mild to moderate aortic valve sclerosis/calcification is present, without  any evidence of aortic stenosis. Aortic  regurgitation PHT measures 495 msec. Aortic valve mean gradient measures  4.0 mmHg.   Comparison(s): No prior Echocardiogram.   Patient Profile     60 y.o. female with history of HTN admitted on 10/25/2020 post out of hospital cardiac arrest. Found unresponsive. Treated with CPR and several rounds of epinephrine with ROSC. No arrhythmia noted.  Assessment & Plan    Out of hospital cardiac arrest. Seen initially by Dr Irish Lack. No documented arrhythmia. No ST elevation on Ecg with LBBB. Not felt to warrant emergent cardiac cath. Troponin increased to 9000. Ecg without serial changes. EF normal by  Echo. Doubt acute ischemic or arrhythmic cause. At this point with continued vent support and acute CVA further ischemic work up is not planned. Could reconsider later depending on recover.  Acute respiratory failure secondary to #1. Per CCM. Failed vent wean yesterday.  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 Appreciate Neuro input. On IV heparin. Will arrange TEE to evaluated for cardiac embolic source. If TEE negative will plan antiplatelet therapy.      For questions or updates, please contact [facility] Please consult [email or web address] for contact info under        Signed, Oakley Orban [place], MD  11/26/2020, 7:44 AM

## 2020-11-26 NOTE — Progress Notes (Signed)
  Echocardiogram Echocardiogram Transesophageal has been performed.  Samantha Cooley 11/26/2020, 12:03 PM

## 2020-11-26 NOTE — Consult Note (Signed)
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 female with HTN, diabetes, anxiety, and depression who presented initially on 9/30 s/p PEA arrest. She was in her usual state of health when she became unresponsive and pulseless all of a sudden. EMS was called and ROSC was achieved after approximately 15 minutes. 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 Troponin significantly elevated on 10/2 and heparin was started for ACS. Patient also was found to have new RUE weakness/neglect and Mri brain showed multiple areas of stroke. 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 Ceftriaxone and vanc were empirically started for possible endocarditis, although blood cultures remain negative. On evaluation today, the patient remains intubated. Plan was for weaning of intubation, however, the patient began to desaturate and thus remained intubated. She is asleep  and unable to be aroused upon exam. Patient is unable to follow commands at this time, although she previously has been noted to follow commands (can move all of her extremities except her RUE).   Past Medical History:  Diagnosis Date   Diabetes ([place])    HTN (hypertension)     Allergies:  Allergies  Allergen Reactions   Aspirin    Aspirin Buffered Other (See Comments)    unspecified   Sertraline Other (See Comments)    Face swelled up.   Sumatriptan Other (See Comments)    In a fog.  Like a zombie.    Current antibiotics: Ceftriaxone and vanc    MEDICATIONS:  chlorhexidine  gluconate (MEDLINE KIT)  15 mL Mouth Rinse BID   Chlorhexidine Gluconate Cloth  6 each Topical Daily   docusate  100 mg Per Tube BID   escitalopram  20 mg Per Tube QHS   feeding supplement (PROSource TF)  45 mL Per Tube TID   free water  30 mL Per Tube Q4H   heparin  5,000 Units Subcutaneous Q8H   insulin aspart  2-6 Units Subcutaneous Q4H   insulin detemir  12 Units Subcutaneous Q12H   ipratropium-albuterol  3 mL Nebulization BID   mouth rinse  15 mL Mouth Rinse 10 times per day   pantoprazole (PROTONIX) IV  40 mg Intravenous Q24H   polyethylene glycol  17 g Per Tube Daily   sodium chloride flush  10-40 mL Intracatheter Q12H    Social History   Tobacco Use   Smoking status: Every Day    Types: E-cigarettes, Cigarettes   Smokeless tobacco: Former  Substance Use Topics   Alcohol use: Yes   Drug use: Yes    Types: Marijuana    Comment: once in a while    Family History  Problem Relation Age of Onset   Cancer Mother     Review of Systems -  Unable to obtain ROS as patient is intubated currently   OBJECTIVE: Temp:  [98.2 F (36.8 C)-100.2 F (37.9 C)] 98.2 F (36.8 C) (10/04 1300) Pulse Rate:  [  55-100] 55 (10/04 1300) Resp:  [14-28] 16 (10/04 1300) BP: (86-154)/(46-92) 127/52 (10/04 1128) SpO2:  [91 %-100 %] 99 % (10/04 1300) Arterial Line BP: (95-174)/(43-85) 121/49 (10/04 1300) FiO2 (%):  [40 %] 40 % (10/04 1300) Weight:  [77.9 kg] 77.9 kg (10/04 0440) Physical Exam Constitutional:      Appearance: She is ill-appearing.  Cardiovascular:     Rate and Rhythm: Normal rate and regular rhythm.     Heart sounds: Murmur heard.  Pulmonary:     Breath sounds: Normal breath sounds.     Comments: Remains intubated Abdominal:     General: There is no distension.     Palpations: Abdomen is soft.  Musculoskeletal:     Right lower leg: No edema.     Left lower leg: No edema.  Neurological:     Comments: Unable to follow commands Right upper extremity hemiparesis        LABS: Results for orders placed or performed during the hospital encounter of 11/01/2020 (from the past 48 hour(s))  Magnesium     Status: None   Collection Time: 11/24/20  4:45 PM  Result Value Ref Range   Magnesium 1.9 1.7 - 2.4 mg/dL    Comment: Performed at [facility], [place] [address] [place], [place] [address]  Phosphorus     Status: None   Collection Time: 11/24/20  4:45 PM  Result Value Ref Range   Phosphorus 2.8 2.5 - 4.6 mg/dL    Comment: Performed at [facility] [address], [place], [place] [address]  Glucose, capillary     Status: Abnormal   Collection Time: 11/24/20  4:46 PM  Result Value Ref Range   Glucose-Capillary 152 (H) 70 - 99 mg/dL    Comment: Glucose reference range applies only to samples taken after fasting for at least 8 hours.   Comment 1 Document in Chart   Glucose, capillary     Status: Abnormal   Collection Time: 11/24/20  8:17 PM  Result Value Ref Range   Glucose-Capillary 147 (H) 70 - 99 mg/dL    Comment: Glucose reference range applies only to samples taken after fasting for at least 8 hours.  CBC     Status: Abnormal   Collection Time: 11/24/20  9:34 PM  Result Value Ref Range   WBC 8.8 4.0 - 10.5 K/uL   RBC 3.14 (L) 3.87 - 5.11 MIL/uL   Hemoglobin 10.5 (L) 12.0 - 15.0 g/dL   HCT 32.9 (L) 36.0 - 46.0 %   MCV 104.8 (H) 80.0 - 100.0 fL   MCH 33.4 26.0 - 34.0 pg   MCHC 31.9 30.0 - 36.0 g/dL   RDW 13.9 11.5 - 15.5 %   Platelets 61 (L) 150 - 400 K/uL    Comment: Immature Platelet Fraction may be clinically indicated, consider ordering this additional test [id number] REPEATED TO VERIFY PLATELET COUNT CONFIRMED BY SMEAR    nRBC 0.0 0.0 - 0.2 %    Comment: Performed at [facility], [place] [address], [place], [place] [address]  Heparin level (unfractionated)     Status: Abnormal   Collection Time: 11/24/20  9:34 PM  Result Value Ref Range   Heparin Unfractionated 0.26 (L) 0.30 - 0.70 IU/mL    Comment: (NOTE) The  clinical reportable range upper limit is being lowered to >1.10 to align with the FDA approved guidance for the current laboratory assay.  If heparin results are below expected values, and patient dosage  has  been confirmed, suggest follow up testing of antithrombin III levels. Performed at [facility], [place] [address], [place], [place] [address]   Glucose, capillary     Status: Abnormal   Collection Time: 11/24/20 11:08 PM  Result Value Ref Range   Glucose-Capillary 139 (H) 70 - 99 mg/dL    Comment: Glucose reference range applies only to samples taken after fasting for at least 8 hours.  Glucose, capillary     Status: Abnormal   Collection Time: 11/25/20  3:06 AM  Result Value Ref Range   Glucose-Capillary 169 (H) 70 - 99 mg/dL    Comment: Glucose reference range applies only to samples taken after fasting for at least 8 hours.  CBC with Differential/Platelet     Status: Abnormal   Collection Time: 11/25/20  3:41 AM  Result Value Ref Range   WBC 7.4 4.0 - 10.5 K/uL   RBC 2.97 (L) 3.87 - 5.11 MIL/uL   Hemoglobin 9.9 (L) 12.0 - 15.0 g/dL   HCT 30.0 (L) 36.0 - 46.0 %   MCV 101.0 (H) 80.0 - 100.0 fL   MCH 33.3 26.0 - 34.0 pg   MCHC 33.0 30.0 - 36.0 g/dL   RDW 13.8 11.5 - 15.5 %   Platelets 61 (L) 150 - 400 K/uL    Comment: Immature Platelet Fraction may be clinically indicated, consider ordering this additional test [id number] CONSISTENT WITH PREVIOUS RESULT REPEATED TO VERIFY    nRBC 0.0 0.0 - 0.2 %   Neutrophils Relative % 80 %   Neutro Abs 6.0 1.7 - 7.7 K/uL   Lymphocytes Relative 14 %   Lymphs Abs 1.1 0.7 - 4.0 K/uL   Monocytes Relative 4 %   Monocytes Absolute 0.3 0.1 - 1.0 K/uL   Eosinophils Relative 1 %   Eosinophils Absolute 0.1 0.0 - 0.5 K/uL   Basophils Relative 0 %   Basophils Absolute 0.0 0.0 - 0.1 K/uL   Immature Granulocytes 1 %   Abs Immature Granulocytes 0.05 0.00 - 0.07 K/uL    Comment: Performed at [facility], [address],  [place], [place] [address]  Comprehensive metabolic panel     Status: Abnormal   Collection Time: 11/25/20  3:41 AM  Result Value Ref Range   Sodium 132 (L) 135 - 145 mmol/L   Potassium 3.9 3.5 - 5.1 mmol/L   Chloride 104 98 - 111 mmol/L   CO2 19 (L) 22 - 32 mmol/L   Glucose, Bld 184 (H) 70 - 99 mg/dL    Comment: Glucose reference range applies only to samples taken after fasting for at least 8 hours.   BUN 18 8 - 23 mg/dL   Creatinine, Ser 1.12 (H) 0.44 - 1.00 mg/dL   Calcium 7.4 (L) 8.9 - 10.3 mg/dL   Total Protein 4.8 (L) 6.5 - 8.1 g/dL   Albumin 2.4 (L) 3.5 - 5.0 g/dL   AST 31 15 - 41 U/L   ALT 33 0 - 44 U/L   Alkaline Phosphatase 81 38 - 126 U/L   Total Bilirubin 0.7 0.3 - 1.2 mg/dL   GFR, Estimated 56 (L) >60 mL/min    Comment: (NOTE) Calculated using the CKD-EPI Creatinine Equation (2021)    Anion gap 9 5 - 15    Comment: Performed at [facility], [place] [address], [place], [place] [address]  Heparin level (unfractionated)     Status: Abnormal   Collection Time: 11/25/20  3:41 AM  Result  Value Ref Range   Heparin Unfractionated 0.13 (L) 0.30 - 0.70 IU/mL    Comment: (NOTE) The clinical reportable range upper limit is being lowered to >1.10 to align with the FDA approved guidance for the current laboratory assay.  If heparin results are below expected values, and patient dosage has  been confirmed, suggest follow up testing of antithrombin III levels. Performed at [facility], [place] [address], [place], [place] [address]   Glucose, capillary     Status: Abnormal   Collection Time: 11/25/20  7:45 AM  Result Value Ref Range   Glucose-Capillary 138 (H) 70 - 99 mg/dL    Comment: Glucose reference range applies only to samples taken after fasting for at least 8 hours.  Glucose, capillary     Status: Abnormal   Collection Time: 11/25/20 11:57 AM  Result Value Ref Range   Glucose-Capillary 116 (H) 70 - 99 mg/dL    Comment: Glucose reference range applies only to  samples taken after fasting for at least 8 hours.  Glucose, capillary     Status: Abnormal   Collection Time: 11/25/20  5:19 PM  Result Value Ref Range   Glucose-Capillary 158 (H) 70 - 99 mg/dL    Comment: Glucose reference range applies only to samples taken after fasting for at least 8 hours.  Lipid panel     Status: None   Collection Time: 11/25/20  6:57 PM  Result Value Ref Range   Cholesterol 139 0 - 200 mg/dL   Triglycerides 111 <150 mg/dL   HDL 59 >40 mg/dL   Total CHOL/HDL Ratio 2.4 RATIO   VLDL 22 0 - 40 mg/dL   LDL Cholesterol 58 0 - 99 mg/dL    Comment:        Total Cholesterol/HDL:CHD Risk Coronary Heart Disease Risk Table                     Men   Women  1/2 Average Risk   3.4   3.3  Average Risk       5.0   4.4  2 X Average Risk   9.6   7.1  3 X Average Risk  23.4   11.0        Use the calculated Patient Ratio above and the CHD Risk Table to determine the patient's CHD Risk.        ATP III CLASSIFICATION (LDL):  <100     mg/dL   Optimal  100-129  mg/dL   Near or Above                    Optimal  130-159  mg/dL   Borderline  160-189  mg/dL   High  >190     mg/dL   Very High Performed at [facility] [address], [place], [place] [address]   Glucose, capillary     Status: None   Collection Time: 11/25/20  7:50 PM  Result Value Ref Range   Glucose-Capillary 86 70 - 99 mg/dL    Comment: Glucose reference range applies only to samples taken after fasting for at least 8 hours.  Glucose, capillary     Status: None   Collection Time: 11/26/20 12:13 AM  Result Value Ref Range   Glucose-Capillary 84 70 - 99 mg/dL    Comment: Glucose reference range applies only to samples taken after fasting for at least 8 hours.  Heparin level (unfractionated)     Status:  Abnormal   Collection Time: 11/26/20 12:18 AM  Result Value Ref Range   Heparin Unfractionated 0.14 (L) 0.30 - 0.70 IU/mL    Comment: (NOTE) The clinical reportable range upper limit is being  lowered to >1.10 to align with the FDA approved guidance for the current laboratory assay.  If heparin results are below expected values, and patient dosage has  been confirmed, suggest follow up testing of antithrombin III levels. Performed at [facility], [place] [address] [place], [place] [address]   MRSA Next Gen by PCR, Nasal     Status: None   Collection Time: 11/26/20 12:18 AM   Specimen: Nasal Mucosa; Nasal Swab  Result Value Ref Range   MRSA by PCR Next Gen NOT DETECTED NOT DETECTED    Comment: (NOTE) The GeneXpert MRSA Assay (FDA approved for NASAL specimens only), is one component of a comprehensive MRSA colonization surveillance program. It is not intended to diagnose MRSA infection nor to guide or monitor treatment for MRSA infections. Test performance is not FDA approved in patients less than 75 years old. Performed at [facility], [place] [address], [place], [place] [address]   Glucose, capillary     Status: None   Collection Time: 11/26/20  3:13 AM  Result Value Ref Range   Glucose-Capillary 92 70 - 99 mg/dL    Comment: Glucose reference range applies only to samples taken after fasting for at least 8 hours.  CBC with Differential/Platelet     Status: Abnormal   Collection Time: 11/26/20  3:23 AM  Result Value Ref Range   WBC 6.7 4.0 - 10.5 K/uL   RBC 2.79 (L) 3.87 - 5.11 MIL/uL   Hemoglobin 9.2 (L) 12.0 - 15.0 g/dL   HCT 28.1 (L) 36.0 - 46.0 %   MCV 100.7 (H) 80.0 - 100.0 fL   MCH 33.0 26.0 - 34.0 pg   MCHC 32.7 30.0 - 36.0 g/dL   RDW 13.8 11.5 - 15.5 %   Platelets 61 (L) 150 - 400 K/uL    Comment: Immature Platelet Fraction may be clinically indicated, consider ordering this additional test [id number] CONSISTENT WITH PREVIOUS RESULT REPEATED TO VERIFY    nRBC 0.0 0.0 - 0.2 %   Neutrophils Relative % 72 %   Neutro Abs 4.8 1.7 - 7.7 K/uL   Lymphocytes Relative 18 %   Lymphs Abs 1.2 0.7 - 4.0 K/uL   Monocytes Relative 6 %   Monocytes Absolute  0.4 0.1 - 1.0 K/uL   Eosinophils Relative 2 %   Eosinophils Absolute 0.1 0.0 - 0.5 K/uL   Basophils Relative 0 %   Basophils Absolute 0.0 0.0 - 0.1 K/uL   Immature Granulocytes 2 %   Abs Immature Granulocytes 0.10 (H) 0.00 - 0.07 K/uL    Comment: Performed at [facility], [address], [place], [place] [address]  Comprehensive metabolic panel     Status: Abnormal   Collection Time: 11/26/20  3:23 AM  Result Value Ref Range   Sodium 134 (L) 135 - 145 mmol/L   Potassium 3.6 3.5 - 5.1 mmol/L   Chloride 102 98 - 111 mmol/L   CO2 23 22 - 32 mmol/L   Glucose, Bld 110 (H) 70 - 99 mg/dL    Comment: Glucose reference range applies only to samples taken after fasting for at least 8 hours.   BUN 14 8 - 23 mg/dL   Creatinine, Ser 1.12 (H) 0.44 - 1.00 mg/dL   Calcium  7.8 (L) 8.9 - 10.3 mg/dL   Total Protein 5.1 (L) 6.5 - 8.1 g/dL   Albumin 2.3 (L) 3.5 - 5.0 g/dL   AST 21 15 - 41 U/L   ALT 24 0 - 44 U/L   Alkaline Phosphatase 80 38 - 126 U/L   Total Bilirubin 0.8 0.3 - 1.2 mg/dL   GFR, Estimated 56 (L) >60 mL/min    Comment: (NOTE) Calculated using the CKD-EPI Creatinine Equation (2021)    Anion gap 9 5 - 15    Comment: Performed at [facility] [address] [place], [place] [address]  Triglycerides     Status: None   Collection Time: 11/26/20  3:23 AM  Result Value Ref Range   Triglycerides 109 <150 mg/dL    Comment: Performed at [facility] [address] [place], [place] [address]  Glucose, capillary     Status: Abnormal   Collection Time: 11/26/20  8:11 AM  Result Value Ref Range   Glucose-Capillary 108 (H) 70 - 99 mg/dL    Comment: Glucose reference range applies only to samples taken after fasting for at least 8 hours.  Heparin level (unfractionated)     Status: Abnormal   Collection Time: 11/26/20  8:58 AM  Result Value Ref Range   Heparin Unfractionated 0.29 (L) 0.30 - 0.70 IU/mL    Comment: (NOTE) The clinical reportable range upper limit is being  lowered to >1.10 to align with the FDA approved guidance for the current laboratory assay.  If heparin results are below expected values, and patient dosage has  been confirmed, suggest follow up testing of antithrombin III levels. Performed at [facility], [place] [address] [place], [place] [address]   Glucose, capillary     Status: Abnormal   Collection Time: 11/26/20 11:22 AM  Result Value Ref Range   Glucose-Capillary 142 (H) 70 - 99 mg/dL    Comment: Glucose reference range applies only to samples taken after fasting for at least 8 hours.    MICRO:  IMAGING: MR ANGIO HEAD WO CONTRAST  Result Date: 11/25/2020 CLINICAL DATA:  Neuro deficit, stroke suspected EXAM: MRI HEAD WITHOUT CONTRAST MRA HEAD WITHOUT CONTRAST MRA NECK WITHOUT AND WITH CONTRAST TECHNIQUE: Multiplanar, multi-echo pulse sequences of the brain and surrounding structures were acquired without intravenous contrast. Angiographic images of the Circle of Willis were acquired using MRA technique without intravenous contrast. Angiographic images of the neck were acquired using MRA technique without and with intravenous contrast. Carotid stenosis measurements (when applicable) are obtained utilizing NASCET criteria, using the distal internal carotid diameter as the denominator. CONTRAST:  45mL GADAVIST GADOBUTROL 1 MMOL/ML IV SOLN COMPARISON:  And prior MRI, correlation is made with CT head 11/05/2020 FINDINGS: MRI HEAD FINDINGS Brain: Areas of restricted diffusion in the cortex and white matter bilateral frontal, parietal, occipital, posterior temporal lobes, as well as the bilateral cerebellar hemispheres. Additional punctate focus of restricted diffusion in the left caudate head. Some of this is in a watershed distribution (series 5, image 94), while other areas are more focal. The cortical areas are associated with gyral swelling (series 12, images 15 and 20, for example). Areas of curvilinear susceptibility in the right  frontal lobe in the area of infarct (series 15, image 36), likely hemorrhage. No extra-axial fluid collection. No mass, mass effect, or midline shift. Remote left thalamic lacunar infarct. Vascular: See MRA, below. Skull and upper cervical spine: Normal marrow signal. Sinuses/Orbits: Air-fluid levels in the right greater  than left maxillary sinus, with trace fluid in the left sphenoid sinus. NG tube seen in the nasal cavity. Partially visualized endotracheal tube in the oral cavity. The orbits are unremarkable. Other: Fluid in the bilateral mastoid air cells. MRA HEAD FINDINGS Anterior circulation: Both internal carotid arteries are patent to the termini, without stenosis or other abnormality; mildly decreased signal in the proximal cavernous carotid is felt to be artifactual. A1 segments patent. Anterior cerebral arteries are patent to their distal aspects. No significant M1 stenosis or occlusion. Normal MCA bifurcations. Distal MCA branches well perfused and symmetric. Posterior circulation: Vertebral arteries patent to the vertebrobasilar junction without stenosis. Focal area of poor flow visualization in the basilar artery (series 5, image 73), which may be artifactual. The basilar is otherwise patent to its distal aspect. Superior cerebral arteries patent bilaterally. PCAs well perfused to their distal aspects without stenosis.Posterior communicating arteries are visualized bilaterally. Anatomic variants: None significant. MRA NECK FINDINGS Aortic arch: Normal three-vessel arch. No significant stenosis or aneurysm. Right carotid system: Patent, without hemodynamically significant stenosis, occlusion, or aneurysm. Plaque at the bifurcation. Left carotid system: Patent, without hemodynamically significant stenosis, occlusion, or aneurysm. Plaque at the bifurcation. Vertebral arteries: Limited visualization of the origin of the right vertebral artery secondary to artifact. The vertebral arteries are otherwise  patent, without significant stenosis, occlusion, or aneurysm. Other: None IMPRESSION: 1. Multiple areas of restricted diffusion throughout the bilateral cerebral and cerebellar hemispheres, some of which are in a watershed pattern, while others are more likely to be embolic, given different vascular territories. Likely petechial hemorrhage in a right frontal lobe cortical infarct, but no evidence of significant hemorrhagic transformation or midline shift. 2. No large vessel occlusion in the intracranial vasculature. 3. No hemodynamically significant stenosis in the neck. Multiple attempts were made to contact a provider taking care of this patient to convey these results. Electronically Signed   By: Merilyn Baba M.D.   On: 11/25/2020 17:32   MR ANGIO NECK W WO CONTRAST  Result Date: 11/25/2020 CLINICAL DATA:  Neuro deficit, stroke suspected EXAM: MRI HEAD WITHOUT CONTRAST MRA HEAD WITHOUT CONTRAST MRA NECK WITHOUT AND WITH CONTRAST TECHNIQUE: Multiplanar, multi-echo pulse sequences of the brain and surrounding structures were acquired without intravenous contrast. Angiographic images of the Circle of Willis were acquired using MRA technique without intravenous contrast. Angiographic images of the neck were acquired using MRA technique without and with intravenous contrast. Carotid stenosis measurements (when applicable) are obtained utilizing NASCET criteria, using the distal internal carotid diameter as the denominator. CONTRAST:  21mL GADAVIST GADOBUTROL 1 MMOL/ML IV SOLN COMPARISON:  And prior MRI, correlation is made with CT head 11/01/2020 FINDINGS: MRI HEAD FINDINGS Brain: Areas of restricted diffusion in the cortex and white matter bilateral frontal, parietal, occipital, posterior temporal lobes, as well as the bilateral cerebellar hemispheres. Additional punctate focus of restricted diffusion in the left caudate head. Some of this is in a watershed distribution (series 5, image 94), while other areas are  more focal. The cortical areas are associated with gyral swelling (series 12, images 15 and 20, for example). Areas of curvilinear susceptibility in the right frontal lobe in the area of infarct (series 15, image 36), likely hemorrhage. No extra-axial fluid collection. No mass, mass effect, or midline shift. Remote left thalamic lacunar infarct. Vascular: See MRA, below. Skull and upper cervical spine: Normal marrow signal. Sinuses/Orbits: Air-fluid levels in the right greater than left maxillary sinus, with trace fluid in the left sphenoid sinus. NG tube seen  in the nasal cavity. Partially visualized endotracheal tube in the oral cavity. The orbits are unremarkable. Other: Fluid in the bilateral mastoid air cells. MRA HEAD FINDINGS Anterior circulation: Both internal carotid arteries are patent to the termini, without stenosis or other abnormality; mildly decreased signal in the proximal cavernous carotid is felt to be artifactual. A1 segments patent. Anterior cerebral arteries are patent to their distal aspects. No significant M1 stenosis or occlusion. Normal MCA bifurcations. Distal MCA branches well perfused and symmetric. Posterior circulation: Vertebral arteries patent to the vertebrobasilar junction without stenosis. Focal area of poor flow visualization in the basilar artery (series 5, image 73), which may be artifactual. The basilar is otherwise patent to its distal aspect. Superior cerebral arteries patent bilaterally. PCAs well perfused to their distal aspects without stenosis.Posterior communicating arteries are visualized bilaterally. Anatomic variants: None significant. MRA NECK FINDINGS Aortic arch: Normal three-vessel arch. No significant stenosis or aneurysm. Right carotid system: Patent, without hemodynamically significant stenosis, occlusion, or aneurysm. Plaque at the bifurcation. Left carotid system: Patent, without hemodynamically significant stenosis, occlusion, or aneurysm. Plaque at the  bifurcation. Vertebral arteries: Limited visualization of the origin of the right vertebral artery secondary to artifact. The vertebral arteries are otherwise patent, without significant stenosis, occlusion, or aneurysm. Other: None IMPRESSION: 1. Multiple areas of restricted diffusion throughout the bilateral cerebral and cerebellar hemispheres, some of which are in a watershed pattern, while others are more likely to be embolic, given different vascular territories. Likely petechial hemorrhage in a right frontal lobe cortical infarct, but no evidence of significant hemorrhagic transformation or midline shift. 2. No large vessel occlusion in the intracranial vasculature. 3. No hemodynamically significant stenosis in the neck. Multiple attempts were made to contact a provider taking care of this patient to convey these results. Electronically Signed   By: Merilyn Baba M.D.   On: 11/25/2020 17:32   MR BRAIN WO CONTRAST  Result Date: 11/25/2020 CLINICAL DATA:  Neuro deficit, stroke suspected EXAM: MRI HEAD WITHOUT CONTRAST MRA HEAD WITHOUT CONTRAST MRA NECK WITHOUT AND WITH CONTRAST TECHNIQUE: Multiplanar, multi-echo pulse sequences of the brain and surrounding structures were acquired without intravenous contrast. Angiographic images of the Circle of Willis were acquired using MRA technique without intravenous contrast. Angiographic images of the neck were acquired using MRA technique without and with intravenous contrast. Carotid stenosis measurements (when applicable) are obtained utilizing NASCET criteria, using the distal internal carotid diameter as the denominator. CONTRAST:  59mL GADAVIST GADOBUTROL 1 MMOL/ML IV SOLN COMPARISON:  And prior MRI, correlation is made with CT head 11/18/2020 FINDINGS: MRI HEAD FINDINGS Brain: Areas of restricted diffusion in the cortex and white matter bilateral frontal, parietal, occipital, posterior temporal lobes, as well as the bilateral cerebellar hemispheres. Additional  punctate focus of restricted diffusion in the left caudate head. Some of this is in a watershed distribution (series 5, image 94), while other areas are more focal. The cortical areas are associated with gyral swelling (series 12, images 15 and 20, for example). Areas of curvilinear susceptibility in the right frontal lobe in the area of infarct (series 15, image 36), likely hemorrhage. No extra-axial fluid collection. No mass, mass effect, or midline shift. Remote left thalamic lacunar infarct. Vascular: See MRA, below. Skull and upper cervical spine: Normal marrow signal. Sinuses/Orbits: Air-fluid levels in the right greater than left maxillary sinus, with trace fluid in the left sphenoid sinus. NG tube seen in the nasal cavity. Partially visualized endotracheal tube in the oral cavity. The orbits are unremarkable. Other:  Fluid in the bilateral mastoid air cells. MRA HEAD FINDINGS Anterior circulation: Both internal carotid arteries are patent to the termini, without stenosis or other abnormality; mildly decreased signal in the proximal cavernous carotid is felt to be artifactual. A1 segments patent. Anterior cerebral arteries are patent to their distal aspects. No significant M1 stenosis or occlusion. Normal MCA bifurcations. Distal MCA branches well perfused and symmetric. Posterior circulation: Vertebral arteries patent to the vertebrobasilar junction without stenosis. Focal area of poor flow visualization in the basilar artery (series 5, image 73), which may be artifactual. The basilar is otherwise patent to its distal aspect. Superior cerebral arteries patent bilaterally. PCAs well perfused to their distal aspects without stenosis.Posterior communicating arteries are visualized bilaterally. Anatomic variants: None significant. MRA NECK FINDINGS Aortic arch: Normal three-vessel arch. No significant stenosis or aneurysm. Right carotid system: Patent, without hemodynamically significant stenosis, occlusion, or  aneurysm. Plaque at the bifurcation. Left carotid system: Patent, without hemodynamically significant stenosis, occlusion, or aneurysm. Plaque at the bifurcation. Vertebral arteries: Limited visualization of the origin of the right vertebral artery secondary to artifact. The vertebral arteries are otherwise patent, without significant stenosis, occlusion, or aneurysm. Other: None IMPRESSION: 1. Multiple areas of restricted diffusion throughout the bilateral cerebral and cerebellar hemispheres, some of which are in a watershed pattern, while others are more likely to be embolic, given different vascular territories. Likely petechial hemorrhage in a right frontal lobe cortical infarct, but no evidence of significant hemorrhagic transformation or midline shift. 2. No large vessel occlusion in the intracranial vasculature. 3. No hemodynamically significant stenosis in the neck. Multiple attempts were made to contact a provider taking care of this patient to convey these results. Electronically Signed   By: Merilyn Baba M.D.   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 Blood cultures remain without growth to date. Patient empirically started on IV ceftriaxone and vancomycin for endocarditis.  - Continue vanc/ceftriaxone x 6 weeks  - CTS consulted for possible valve replacement surgery  - Will hold off on culture negative endocarditis testing

## 2020-11-26 NOTE — TOC CAGE-AID Note (Addendum)
Transition of Care [facility]) - CAGE-AID Screening   Patient Details  Name: Samantha Cooley MRN: [id number] Date of Birth: November 08, 1960  [facility] of [facility] CM/SW Contact:    Annelies Coyt C Tarpley-Carter, LCSWA Phone Number: 11/26/2020, 12:06 PM   Clinical Narrative: Pt is unable to participate in Cage Aid.  Pt is intubated.  CSW will provide pt with resources for possible future use.  Aamari Strawderman Tarpley-Carter, MSW, LCSW-A Pronouns:  She/Her/Hers [facility] Health[facility]itions of Care Clinical Social Worker Direct Number:  [phone] [email or web address]  CAGE-AID Screening: Substance Abuse Screening unable to be completed due to: : Patient unable to participate (Pt is intubated.)             Substance Abuse Education Offered: No

## 2020-11-26 NOTE — CV Procedure (Signed)
TRANSESOPHAGEAL ECHOCARDIOGRAM (TEE) NOTE  INDICATIONS: Cryptogenic stroke  PROCEDURE:   Informed consent was obtained prior to the procedure. The risks, benefits and alternatives for the procedure were discussed and the patient comprehended these risks.  Risks include, but are not limited to, cough, sore throat, vomiting, nausea, somnolence, esophageal and stomach trauma or perforation, bleeding, low blood pressure, aspiration, pneumonia, infection, trauma to the teeth and death.    After a procedural time-out, the patient was intubated and sedated in the ICU on a propofol gtts which was monitored by the CCRN. The patient's heart rate, blood pressure, and oxygen saturation are monitored continuously during the procedure. The transesophageal probe was inserted in the esophagus and stomach without difficulty and multiple views were obtained.  The patient was kept under observation until the patient left the procedure room.  I was present face-to-face 100% of this time. The patient left the procedure room in stable condition.   Agitated microbubble saline contrast was administered.  COMPLICATIONS:    There were no immediate complications.  Findings:  LEFT VENTRICLE: The left ventricular wall thickness is moderately increased.  The left ventricular cavity is normal in size. Wall motion is normal.  LVEF is 55-60%.  RIGHT VENTRICLE:  The right ventricle is normal in structure and function without any thrombus or masses.    LEFT ATRIUM:  The left atrium is normal in size without any thrombus or masses.  There is not spontaneous echo contrast ("smoke") in the left atrium consistent with a low flow state.  LEFT ATRIAL APPENDAGE:  The left atrial appendage is free of any thrombus or masses. The appendage has single lobes. Pulse doppler indicates high flow in the appendage.  ATRIAL SEPTUM:  The atrial septum appears intact and is free of thrombus and/or masses.  There is no evidence for  interatrial shunting by color doppler and saline microbubble.  RIGHT ATRIUM:  The right atrium is normal in size and function without any thrombus or masses.  MITRAL VALVE:  The mitral valve is normal in structure and function with  trivial  regurgitation.  There were no vegetations or stenosis.  AORTIC VALVE:  The aortic valve is trileaflet. There is thickening of the leaflets and a large vegetation measuring 1.5 x 0.5 cm noted on the non-coronary cusp. There is associated moderate to probably Severe regurgitation, which is eccentric and consists of multiple jets. Cannot exclude leaflet perforation.  TRICUSPID VALVE:  The tricuspid valve is normal in structure and function with  trivial  regurgitation.  There were no vegetations or stenosis   PULMONIC VALVE:  The pulmonic valve is normal in structure and function with  trivial  regurgitation.  There were no vegetations or stenosis.   AORTIC ARCH, ASCENDING AND DESCENDING AORTA:  There was grade 1 Myrtis Ser et. Al, 1992) atherosclerosis of the ascending aorta, aortic arch, or proximal descending aorta.  12. PULMONARY VEINS: Anomalous pulmonary venous return was not noted.  13. PERICARDIUM: The pericardium appeared normal and non-thickened.  There is no pericardial effusion.  IMPRESSION:   1.5 x 0.5 cm mobile mass associated with the non-coronary cusp of the aortic valve, suspicious for vegetation. There is associated moderate to probable severe aortic regurgitation (eccentric, multiple jets). No LAA thrombus Negative for PFO by color doppler and saline microbubble contrast Moderate concentric LVH LVEF 55-60%  RECOMMENDATIONS:    Recommend ID consultation for probable endocarditis - to date, blood cultures have been negative. D/w Dr. Roda Shutters (neurology) and Dr. Tonia Brooms (PCCM). Ultimately may  need to have CT surgery evaluation for valve replacement given significant AI.  Time Spent Directly with the Patient:  60 minutes   Chrystie Nose, MD,  [facility], FACP  [place]  [facility]  Medical Director of the Advanced Lipid Disorders &  Cardiovascular Risk Reduction Clinic Diplomate of the American Board of Clinical Lipidology Attending Cardiologist  Direct Dial: [phone]  Fax: [phone]  Website:  [email or web address] Nicely Quamaine Webb 11/26/2020, 12:08 PM

## 2020-11-26 NOTE — Progress Notes (Signed)
Pharmacy Antibiotic Note  Samantha Cooley is a 60 y.o. female admitted on 11/13/2020 presenting post-PEA arrest and then developed a stroke. TEE today, found a vegetation on the aortic valve. Pharmacy has been consulted for vancomycin and ceftriaxone dosing for probable culture negative endocarditis.  Patient initially presented as septic and was given a one time dose of Vancomycin 1250 mg IV, however antibiotics were then discontinued. Patient's renal function was elevated on admission and experienced AKI which has now resolved.   9/30: UA >500 glucose, rare bacteria 9/30 Bcx: NGTD  Plan: Vancomycin 1750 mg IV x 1 then vancomycin 1250 mg IV q 24h (est AUC 534) Ceftriaxone 2g IV daily Monitor renal function, Cx and clinical progression to narrow F/u ID recommendations   Height: 5\' 4"  (162.6 cm) Weight: 77.9 kg (171 lb 11.8 oz) IBW/kg (Calculated) : 54.7  Temp (24hrs), Avg:99.1 F (37.3 C), Min:98.2 F (36.8 C), Max:100.2 F (37.9 C)  Recent Labs  Lab 10/25/2020 1610 10/28/2020 1614 10/28/2020 1810 11/19/2020 1900 11/23/20 0157 11/23/20 0810 11/24/20 0357 11/24/20 2134 11/25/20 0341 11/26/20 0323  WBC 18.3*  --   --   --  18.4*  --  9.5 8.8 7.4 6.7  CREATININE 1.75*   < >  --    < > 1.89* 1.77* 1.30*  --  1.12* 1.12*  LATICACIDVEN >9.0*  --  6.3*  --   --   --   --   --   --   --    < > = values in this interval not displayed.     Estimated Creatinine Clearance: 52.6 mL/min (A) (by C-G formula based on SCr of 1.12 mg/dL (H)).    Allergies  Allergen Reactions   Aspirin    Aspirin Buffered Other (See Comments)    unspecified   Sertraline Other (See Comments)    Face swelled up.   Sumatriptan Other (See Comments)    In a fog.  Like a zombie.    01/26/21, PharmD PGY2 Cardiology Pharmacy Resident Phone: [phone] 11/26/2020  2:02 PM  Please check [email or web address] for unit-specific pharmacy phone numbers.

## 2020-11-26 NOTE — Progress Notes (Signed)
ANTICOAGULATION CONSULT NOTE - Follow Up Consult  Pharmacy Consult for heparin Indication:  stroke w/ concern for LV thrombus  Labs: Recent Labs    11/23/20 0758 11/23/20 0810 11/24/20 0357 11/24/20 0806 11/24/20 2134 11/25/20 0341 11/26/20 0018  HGB  --   --  11.1*  --  10.5* 9.9*  --   HCT  --   --  32.5*  --  32.9* 30.0*  --   PLT  --   --  PLATELET CLUMPS NOTED ON SMEAR, UNABLE TO ESTIMATE  --  61* 61*  --   HEPARINUNFRC  --   --   --    < > 0.26* 0.13* 0.14*  CREATININE  --  1.77* 1.30*  --   --  1.12*  --   TROPONINIHS 9,756*  --  2,818*  --   --   --   --    < > = values in this interval not displayed.     Assessment: 60yo female subtherapeutic on heparin after resuming, had been stopped for several hours for bleeding from IV site, new IV established and no bleeding since heparin was resumed.   Goal of Therapy:  Heparin level 0.3-0.5 units/ml   Plan:  Will increase heparin infusion cautiously to 1300 units/hr and check level in 6-8 hours.    Vernard Gambles, PharmD, BCPS  11/26/2020,1:09 AM

## 2020-11-26 NOTE — H&P (Signed)
   INTERVAL PROCEDURE H&P  History and Physical Interval Note:  11/26/2020 10:05 AM  Samantha Cooley has presented today for their planned procedure. The various methods of treatment have been discussed with the patient and family. After consideration of risks, benefits and other options for treatment, the patient has consented to the procedure.  The patients' outpatient history has been reviewed, patient examined, and no change in status from most recent office note within the past 30 days. I have reviewed the patients' chart and labs and will proceed as planned. Questions were answered to the patient's satisfaction.   Chrystie Nose, MD, [facility], FACP  [place]  [facility]  Medical Director of the Advanced Lipid Disorders &  Cardiovascular Risk Reduction Clinic Diplomate of the American Board of Clinical Lipidology Attending Cardiologist  Direct Dial: [phone]  Fax: [phone]  Website:  [email or web address] 11/26/2020, 10:05 AM

## 2020-11-27 ENCOUNTER — Inpatient Hospital Stay (HOSPITAL_COMMUNITY): Payer: Self-pay

## 2020-11-27 DIAGNOSIS — J9691 Respiratory failure, unspecified with hypoxia: Secondary | ICD-10-CM

## 2020-11-27 DIAGNOSIS — I469 Cardiac arrest, cause unspecified: Secondary | ICD-10-CM

## 2020-11-27 DIAGNOSIS — I350 Nonrheumatic aortic (valve) stenosis: Secondary | ICD-10-CM

## 2020-11-27 DIAGNOSIS — I339 Acute and subacute endocarditis, unspecified: Secondary | ICD-10-CM

## 2020-11-27 DIAGNOSIS — I330 Acute and subacute infective endocarditis: Secondary | ICD-10-CM

## 2020-11-27 DIAGNOSIS — I38 Endocarditis, valve unspecified: Secondary | ICD-10-CM

## 2020-11-27 LAB — BASIC METABOLIC PANEL
Anion gap: 7 (ref 5–15)
Anion gap: 13 (ref 5–15)
BUN: 16 mg/dL (ref 8–23)
BUN: 17 mg/dL (ref 8–23)
CO2: 22 mmol/L (ref 22–32)
CO2: 22 mmol/L (ref 22–32)
Calcium: 7.9 mg/dL — ABNORMAL LOW (ref 8.9–10.3)
Calcium: 8.3 mg/dL — ABNORMAL LOW (ref 8.9–10.3)
Chloride: 106 mmol/L (ref 98–111)
Chloride: 102 mmol/L (ref 98–111)
Creatinine, Ser: 0.98 mg/dL (ref 0.44–1.00)
Creatinine, Ser: 1.21 mg/dL — ABNORMAL HIGH (ref 0.44–1.00)
GFR, Estimated: >60 mL/min (ref >60)
GFR, Estimated: 51 mL/min — ABNORMAL LOW (ref >60)
Glucose, Bld: 117 mg/dL — ABNORMAL HIGH (ref 70–99)
Glucose, Bld: 144 mg/dL — ABNORMAL HIGH (ref 70–99)
Potassium: 4.2 mmol/L (ref 3.5–5.1)
Potassium: 4.2 mmol/L (ref 3.5–5.1)
Sodium: 135 mmol/L (ref 135–145)
Sodium: 137 mmol/L (ref 135–145)

## 2020-11-27 LAB — CBC WITH DIFFERENTIAL/PLATELET
Abs Immature Granulocytes: 0.02 10*3/uL (ref 0.00–0.07)
Basophils Absolute: 0 10*3/uL (ref 0.0–0.1)
Basophils Relative: 1 %
Eosinophils Absolute: 0.1 10*3/uL (ref 0.0–0.5)
Eosinophils Relative: 2 %
HCT: 26.4 % — ABNORMAL LOW (ref 36.0–46.0)
Hemoglobin: 8.5 g/dL — ABNORMAL LOW (ref 12.0–15.0)
Immature Granulocytes: 0 %
Lymphocytes Relative: 14 %
Lymphs Abs: 0.9 10*3/uL (ref 0.7–4.0)
MCH: 32.8 pg (ref 26.0–34.0)
MCHC: 32.2 g/dL (ref 30.0–36.0)
MCV: 101.9 fL — ABNORMAL HIGH (ref 80.0–100.0)
Monocytes Absolute: 0.6 10*3/uL (ref 0.1–1.0)
Monocytes Relative: 10 %
Neutro Abs: 4.3 10*3/uL (ref 1.7–7.7)
Neutrophils Relative %: 73 %
Platelets: 92 10*3/uL — ABNORMAL LOW (ref 150–400)
RBC: 2.59 MIL/uL — ABNORMAL LOW (ref 3.87–5.11)
RDW: 14 % (ref 11.5–15.5)
WBC: 6 10*3/uL (ref 4.0–10.5)
nRBC: 0 % (ref 0.0–0.2)

## 2020-11-27 LAB — VOLATILES,BLD-ACETONE,ETHANOL,ISOPROP,METHANOL
Acetone, blood: <0.01 g/dL (ref 0.000–0.010)
Ethanol, blood: <0.01 g/dL (ref 0.000–0.010)
Isopropanol, blood: <0.01 g/dL (ref 0.000–0.010)
Methanol, blood: <0.01 g/dL (ref 0.000–0.010)

## 2020-11-27 LAB — CULTURE, BLOOD (ROUTINE X 2)
Culture: NO GROWTH
Culture: NO GROWTH
Special Requests: ADEQUATE
Special Requests: ADEQUATE

## 2020-11-27 LAB — MAGNESIUM: Magnesium: 2.1 mg/dL (ref 1.7–2.4)

## 2020-11-27 LAB — GLUCOSE, CAPILLARY
Glucose-Capillary: 118 mg/dL — ABNORMAL HIGH (ref 70–99)
Glucose-Capillary: 128 mg/dL — ABNORMAL HIGH (ref 70–99)
Glucose-Capillary: 138 mg/dL — ABNORMAL HIGH (ref 70–99)
Glucose-Capillary: 144 mg/dL — ABNORMAL HIGH (ref 70–99)
Glucose-Capillary: 158 mg/dL — ABNORMAL HIGH (ref 70–99)
Glucose-Capillary: 162 mg/dL — ABNORMAL HIGH (ref 70–99)
Glucose-Capillary: 204 mg/dL — ABNORMAL HIGH (ref 70–99)

## 2020-11-27 IMAGING — DX DG CHEST 1V PORT
1 series · 1 of 1 positions shown · non-contrast
Comparison: [DATE].

CLINICAL DATA: Cardiac arrest, hypoxia.

EXAM:
PORTABLE CHEST 1 VIEW

[chest]
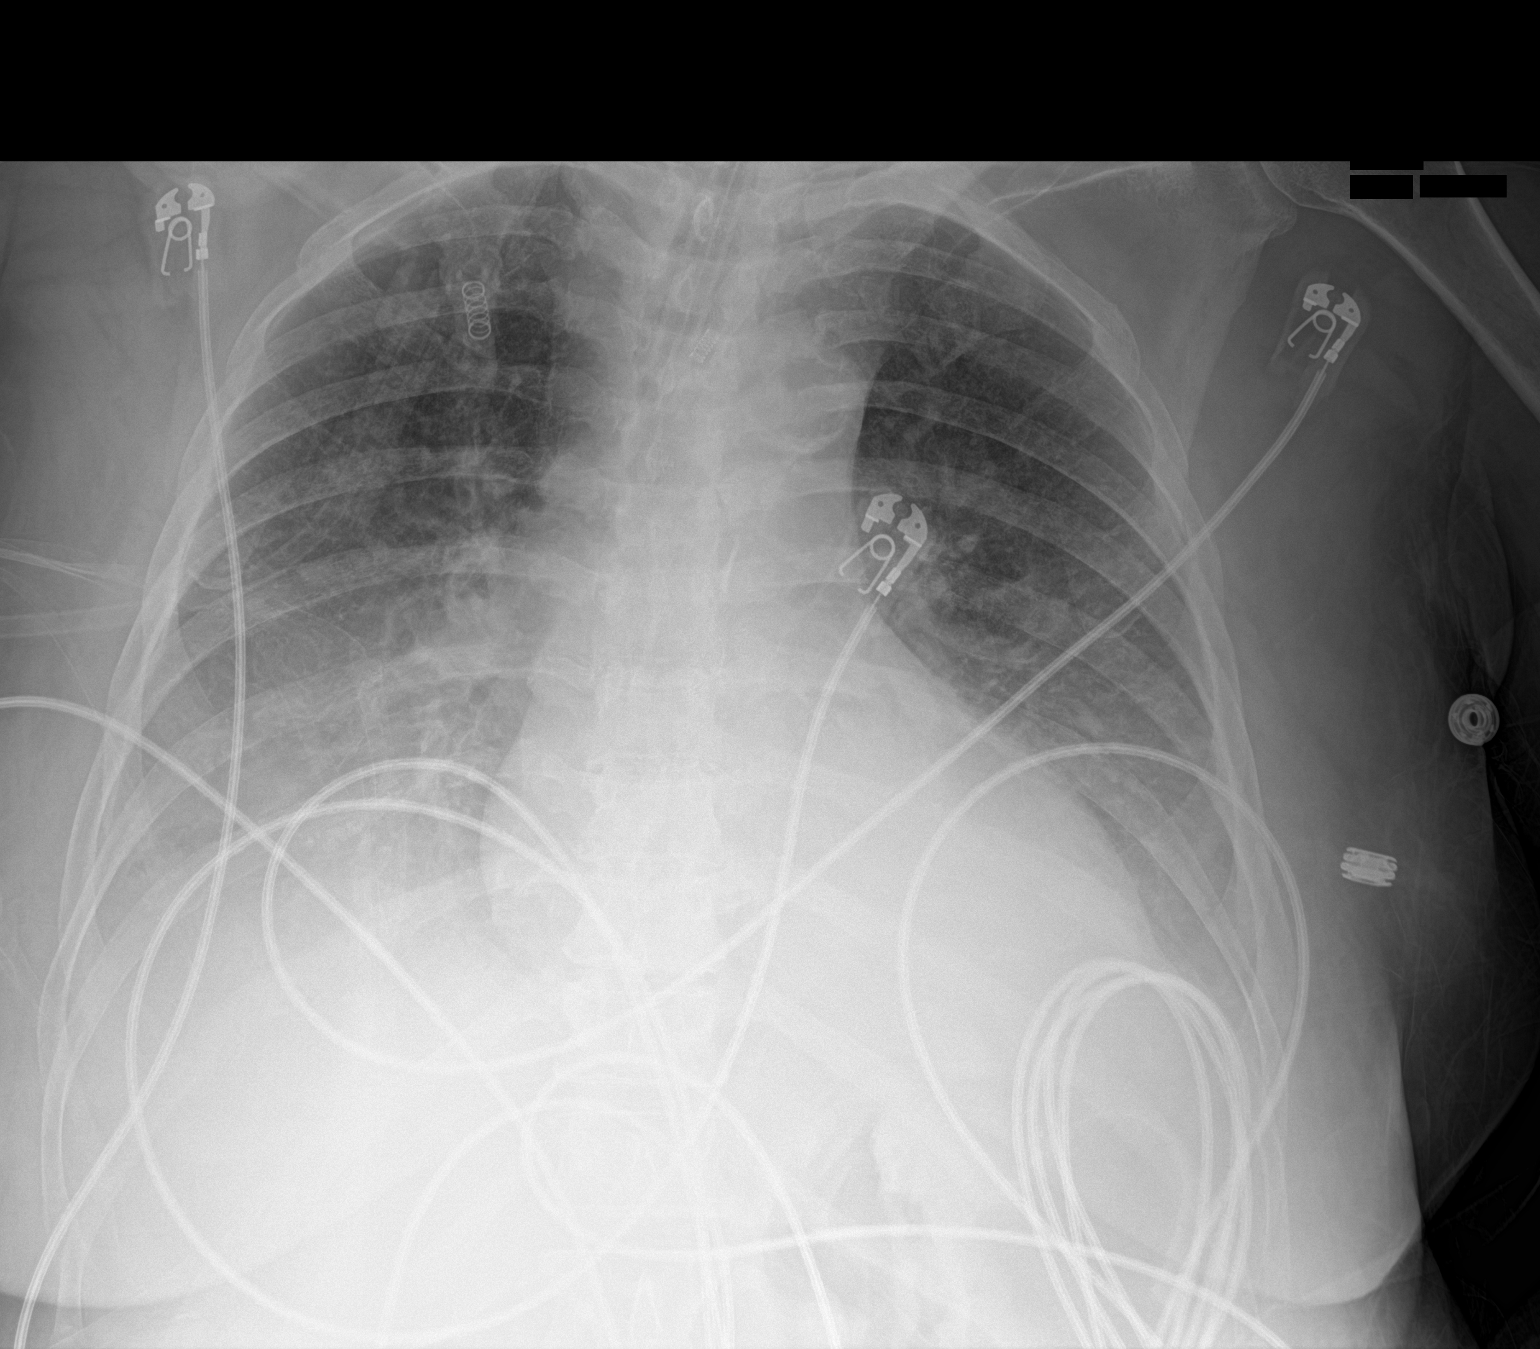

[1 of 1 positions shown; findings below may reference images not displayed]

FINDINGS: Stable cardiomediastinal silhouette. Endotracheal tube is in good
position. No pneumothorax is noted. Stable bibasilar atelectasis or
edema is noted with associated pleural effusions. Bony thorax is
unremarkable.
IMPRESSION: Stable bibasilar atelectasis or edema with associated pleural
effusions.

## 2020-11-27 IMAGING — DX DG ABD PORTABLE 1V
1 series · 1 of 1 positions shown · non-contrast
Comparison: None.

CLINICAL DATA: Feeding tube placement

EXAM:
PORTABLE ABDOMEN - 1 VIEW

[abdomen kub]
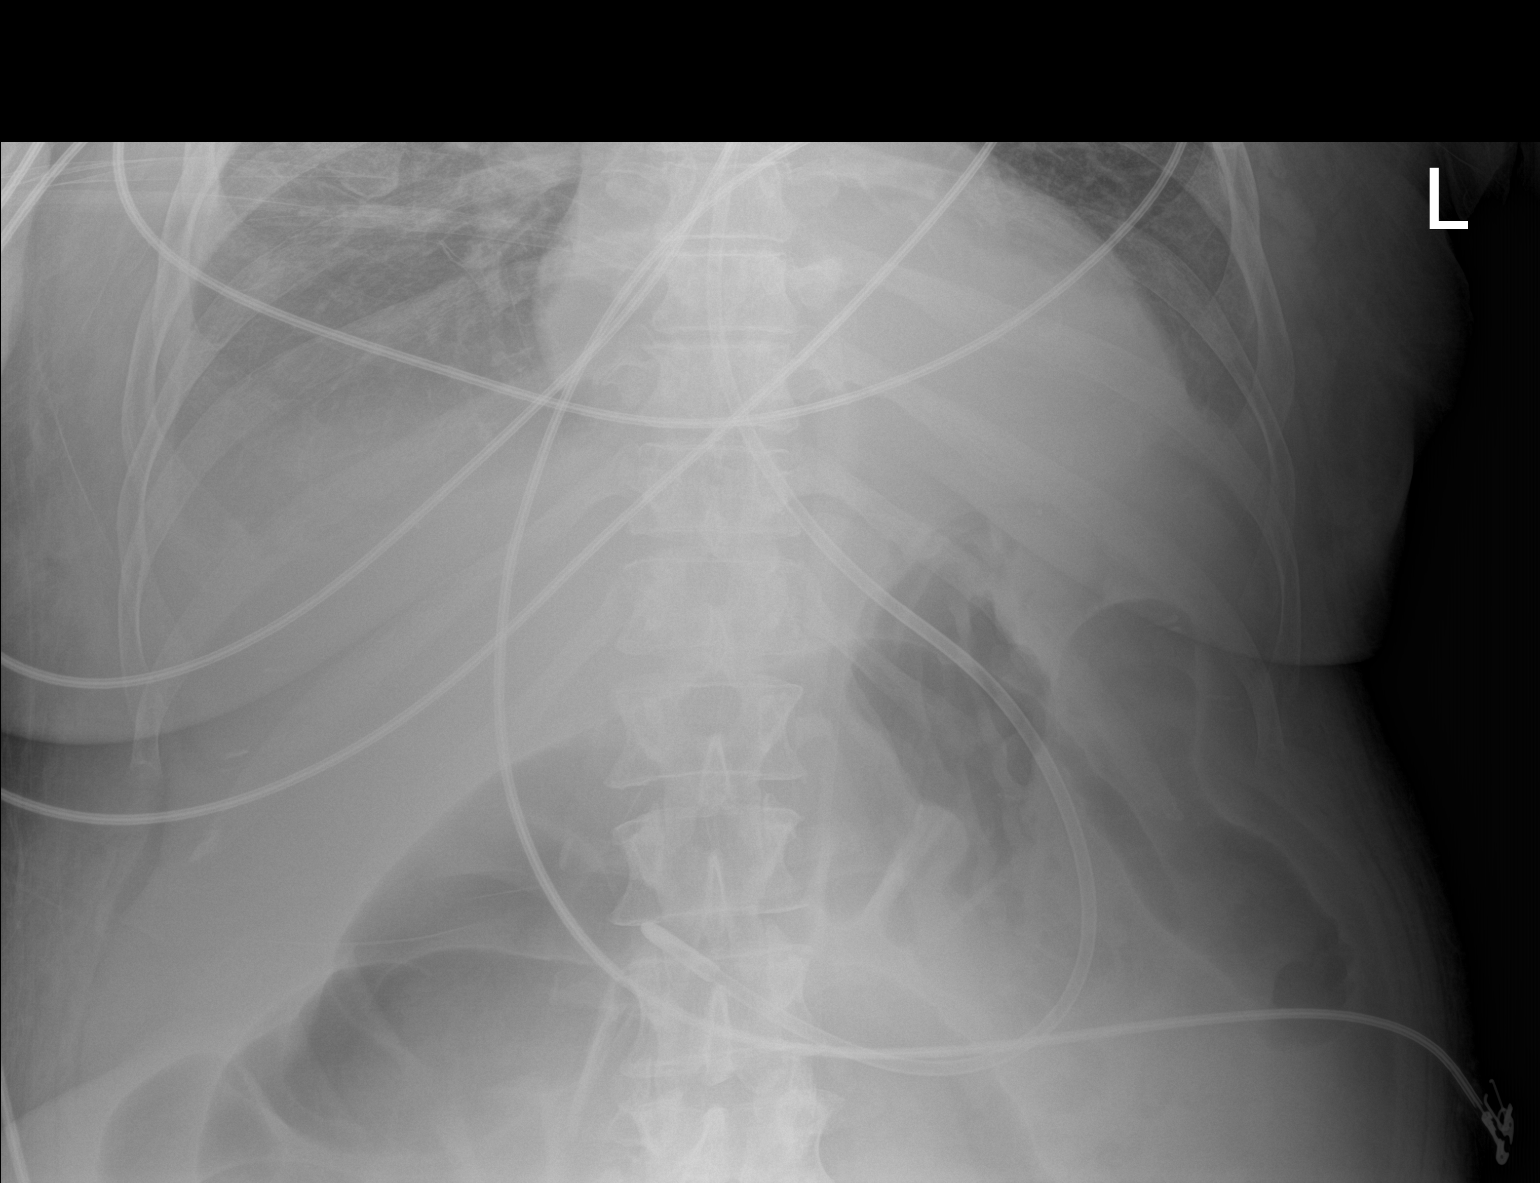

[1 of 1 positions shown; findings below may reference images not displayed]

FINDINGS: Feeding tube tip is in the gastric antrum.
IMPRESSION: Feeding tube tip is in the gastric antrum.

## 2020-11-27 MED ORDER — PROPOFOL 1000 MG/100ML IV EMUL
INTRAVENOUS | Status: AC
  Administered 2020-11-27: 5 ug/kg/min via INTRAVENOUS
  Filled 2020-11-27: qty 100

## 2020-11-27 MED ORDER — ACETAMINOPHEN 10 MG/ML IV SOLN
1000.0000 mg | Freq: Once | INTRAVENOUS | Status: AC
  Filled 2020-11-27: qty 100

## 2020-11-27 MED ORDER — PROPOFOL 1000 MG/100ML IV EMUL
5.0000 ug/kg/min | INTRAVENOUS | Status: DC
  Administered 2020-11-28: 20 ug/kg/min via INTRAVENOUS
  Administered 2020-11-28: 35 ug/kg/min via INTRAVENOUS
  Administered 2020-11-29: 20 ug/kg/min via INTRAVENOUS
  Administered 2020-11-29: 35 ug/kg/min via INTRAVENOUS
  Administered 2020-11-29: 25 ug/kg/min via INTRAVENOUS
  Filled 2020-11-27 (×5): qty 100

## 2020-11-27 MED ORDER — LORAZEPAM 2 MG/ML IJ SOLN
INTRAMUSCULAR | Status: AC
  Filled 2020-11-27: qty 1

## 2020-11-27 MED ORDER — ACETAMINOPHEN 10 MG/ML IV SOLN
1000.0000 mg | Freq: Once | INTRAVENOUS | Status: AC
  Administered 2020-11-27: 1000 mg via INTRAVENOUS
  Filled 2020-11-27: qty 100

## 2020-11-27 MED ORDER — FUROSEMIDE 10 MG/ML IJ SOLN
60.0000 mg | Freq: Three times a day (TID) | INTRAMUSCULAR | Status: DC
  Administered 2020-11-27 – 2020-11-28 (×3): 60 mg via INTRAVENOUS
  Filled 2020-11-27 (×3): qty 6

## 2020-11-27 MED ORDER — INSULIN DETEMIR 100 UNIT/ML ~~LOC~~ SOLN
5.0000 [IU] | Freq: Two times a day (BID) | SUBCUTANEOUS | Status: DC
  Administered 2020-11-27 – 2020-11-28 (×2): 5 [IU] via SUBCUTANEOUS
  Filled 2020-11-27 (×3): qty 0.05

## 2020-11-27 MED ORDER — ROCURONIUM BROMIDE 10 MG/ML (PF) SYRINGE
PREFILLED_SYRINGE | INTRAVENOUS | Status: AC
  Administered 2020-11-27: 50 mg via INTRAVENOUS
  Filled 2020-11-27: qty 10

## 2020-11-27 MED ORDER — NOREPINEPHRINE 4 MG/250ML-% IV SOLN: 0.0000 ug/min | INTRAVENOUS | Status: DC

## 2020-11-27 MED ORDER — ROCURONIUM BROMIDE 50 MG/5ML IV SOLN: 50.0000 mg | Freq: Once | INTRAVENOUS | Status: AC

## 2020-11-27 MED ORDER — NOREPINEPHRINE 4 MG/250ML-% IV SOLN
INTRAVENOUS | Status: AC
  Filled 2020-11-27: qty 250

## 2020-11-27 MED ORDER — FUROSEMIDE 10 MG/ML IJ SOLN
40.0000 mg | Freq: Once | INTRAMUSCULAR | Status: AC
  Administered 2020-11-27: 40 mg via INTRAVENOUS
  Filled 2020-11-27: qty 4

## 2020-11-27 NOTE — Progress Notes (Signed)
eLink Physician-Brief Progress Note Patient Name: Samantha Cooley DOB: 23-May-1960 MRN: [id number]   Date of Service  11/27/2020  HPI/Events of Note  Pt biting ett, out of sync with vent.  Was given push of fent and versed w/out relief  eICU Interventions  Starting propofol, giving a dose of rocuronium 50 x 1 due to stacking.  Adding levo if needed.     Intervention Category Major Interventions: Respiratory failure - evaluation and management  Jacinta Shoe 11/27/2020, 8:09 PM

## 2020-11-27 NOTE — Procedures (Signed)
Cortrak  Person Inserting Tube:  Precious Gilchrest L, RD Tube Type:  Cortrak - 43 inches Tube Size:  10 Tube Location:  Right nare Initial Placement:  Stomach Secured by: Bridle Technique Used to Measure Tube Placement:  Marking at nare/corner of mouth Cortrak Secured At:  70 cm  Cortrak Tube Team Note:  Consult received to place a Cortrak feeding tube.   X-ray is required, abdominal x-ray has been ordered by the Cortrak team. Please confirm tube placement before using the Cortrak tube.   If the tube becomes dislodged please keep the tube and contact the Cortrak team at [email or web address] ([email or web address]) for replacement.  If after hours and replacement cannot be delayed, place a NG tube and confirm placement with an abdominal x-ray.    Yovanni Frenette BS, PLDN Clinical Dietitian See AMiON for contact information.     

## 2020-11-27 NOTE — Progress Notes (Addendum)
STROKE PROGRESS NOTE  ATTENDING NOTE: I reviewed above note and agree with the assessment and plan. Pt was seen and examined.   No family at bedside.  Patient still intubated, lying in bed, on Precedex and fentanyl.  Per RN, once off sedation patient was able to be more active, moving on the left side, but still not follow commands.  ID on board, on antibiotics.  Off heparin IV.  On exam, patient intubated on Precedex and fentanyl, eyes closed, however open on repetitive voice and tactile stimulation, not following commands. With forced eye opening, eyes in mid position, not blinking to visual threat, doll's eyes sluggish, not tracking, PERRL. Corneal reflex present, gag and cough present. Breathing over the vent.  Facial symmetry not able to test due to ET tube.  Tongue protrusion not cooperative. On pain stimulation, no movement in all extremities. DTR diminished and bilateral positive babinski. Sensation, coordination and gait not tested.  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  Although blood culture negative and no leukocytosis, due to culture was done after antibiotics and the patient does have low-grade fever today.  From neuro/stroke standpoint, no antithrombotic needed for endocarditis related stroke, antibiotic treatment is the mainstay therapy.  Currently heparin IV has been discontinued.  However, agree with cardiology that given her cardic arrest/STEMI, antiplatelet is reasonable.  She has aspirin allergy, agree with Plavix once appropriate.  For detailed assessment and plan, please refer to above as I have made changes wherever appropriate.   Neurology will sign off. Please call with questions. Pt will follow up with stroke clinic at [facility] in about 4 weeks after discharge. Thanks for the consult.  This patient is critically ill due to bilateral cardioembolic stroke, AV large vegetation, endocarditis, respiratory failure,  cardiac arrest and at significant risk of neurological worsening, death form recurrent stroke, hemorrhagic conversion, heart failure, cardiac arrest, sepsis. This patient's care requires constant monitoring of vital signs, hemodynamics, respiratory and cardiac monitoring, review of multiple databases, neurological assessment, discussion with family, other specialists and medical decision making of high complexity. I spent 35 minutes of neurocritical care time in the care of this patient.  Marvel Plan, MD PhD Stroke Neurology 11/27/2020 7:15 PM   INTERVAL HISTORY No family member is at the bedside at time of this exam.   Vitals:   11/27/20 0840 11/27/20 0900 11/27/20 1113 11/27/20 1140  BP: (!) 150/54   (!) 166/71  Pulse: 76 65  71  Resp: 12 16  17   Temp:   (!) 100.5 F (38.1 C)   TempSrc:   Oral   SpO2: 94% 94%  95%  Weight:      Height:       CBC:  Recent Labs  Lab 11/26/20 0323 11/27/20 0340  WBC 6.7 6.0  NEUTROABS 4.8 4.3  HGB 9.2* 8.5*  HCT 28.1* 26.4*  MCV 100.7* 101.9*  PLT 61* 92*    Basic Metabolic Panel:  Recent Labs  Lab 11/24/20 0357 11/24/20 1645 11/25/20 0341 11/26/20 0323 11/27/20 0340  NA 132*  --    < > 134* 135  K 4.1  --    < > 3.6 4.2  CL 106  --    < > 102 106  CO2 16*  --    < > 23 22  GLUCOSE 178*  --    < > 110* 117*  BUN 21  --    < > 14 16  CREATININE  1.30*  --    < > 1.12* 0.98  CALCIUM 6.9*  --    < > 7.8* 7.9*  MG 1.6* 1.9  --   --  2.1  PHOS 1.9* 2.8  --   --   --    < > = values in this interval not displayed.     Lipid Panel:  Recent Labs  Lab 11/25/20 1857 11/26/20 0323  CHOL 139  --   TRIG 111 109  HDL 59  --   CHOLHDL 2.4  --   VLDL 22  --   LDLCALC 58  --      HgbA1c:  Recent Labs  Lab 11/23/20 0244  HGBA1C 6.5*    Urine Drug Screen:  Recent Labs  Lab 12-22-2020 1720  LABOPIA NONE DETECTED  COCAINSCRNUR NONE DETECTED  LABBENZ POSITIVE*  AMPHETMU NONE DETECTED  THCU POSITIVE*  LABBARB NONE DETECTED      Alcohol Level No results for input(s): ETH in the last 168 hours.  IMAGING past 24 hours DG Chest Port 1 View  Result Date: 11/26/2020 CLINICAL DATA:  Hypoxia. EXAM: PORTABLE CHEST 1 VIEW COMPARISON:  November 24, 2020. FINDINGS: Stable cardiomediastinal silhouette. Endotracheal tube is in good position. Bibasilar atelectasis or edema is noted with associated pleural effusions. Bony thorax is unremarkable. IMPRESSION: Bibasilar atelectasis or edema is noted with associated pleural effusions. Electronically Signed   By: Lupita Raider M.D.   On: 11/26/2020 15:41    PHYSICAL EXAM  Mental Status: Patient is asleep initially, wakes easily to voice.  Intubated with minimal sedation.   She nods yes and no appropriately    Cranial Nerves:  II: PERRL 3 mm/brisk.   III, IV, VI: she has a slightly dysconjugate gaze with right eye slightly right of midline and left eye at midline.  she is able to cross midline at time of exam.   She has occasional spontaneous leftward eye movements within a midline gaze.  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   V: Sensation is intact to light touch and symmetrical to face. Blinks to threat on the right with inconsistent blink to threat on the left for NP, opposite for MD. Patient consistently indicates that she is not having visual deficits nor trouble moving her eyes, though it is clear she cannot orient to stimuli.  VII: Face appears symmetric resting and with movement within the limitation of ETT placement.  VIII: Hearing is intact to voice IX, X:Patient has a spontaneous cough and gag.  XI: Shoulder shrug with increased elevation on the left.  Motor: 5/5 strength present in bilateral lower extremities without vertical drift.  Left upper extremity strength is also 5/5 without pronator drift.  Right upper extremity does have some antigravity movement but is unable to lift more than 10 degrees off of the bed. She is able to move her RUE  laterally with vertical drift on assessment.  Right hand grip strength significantly weak with minimal right hand movement and 1/5 strength, left grip strength 5/5.  Tone is normal. Bulk is normal.   Sensation: Intact to light touch bilaterally in all four extremities.   Coordination: Unable to assess, patient has difficulty following multi-step commands   DTRs: 3+ and symmetric patellae, biceps, and brachioradialis throughout.   Plantars: Toes upgoing bilaterally Gait: Deferred    ASSESSMENT/PLAN  Samantha Cooley is a 60 y.o. female with a medical history significant for essential hypertension, type 2 diabetes mellitus, anxiety, depression, and insomnia who  presented to the ED 9/30 following an outpatient PEA arrest. Patient was found by her significant other the afternoon of 9/30 unresponsive after speaking to him on the telephone in her normal state of health approximately 50 minutes prior to being found unresponsive. Her significant other initiated CPR and EMS arrived with initial PEA rhythm. EMS initiated CPR and achieved ROSC in approximately 15 minutes. Initial work up revealed an anion gap acidosis, elevated lactate, hyperglycemia, leukocytosis, elevated troponin, respiratory acidosis, and hypoxia with chest x-ray findings significant for bilateral infiltrates. Patient does follow simple commands while intubated, however, there have been concerns for right hand and right upper extremity weakness since the morning of 10/1 with some documentation reporting no movement of the right arm and no response to pain. Due to ongoing concern for unilateral weakness, neurology was consulted for further evaluation.   -- MRI brain reveals multiple areas of restricted diffusion throughout the bilateral cerebral and cerebellar hemispheres, some which are in a watershed pattern, while others are more likely to be embolic, given the different vascular territories. There is also likely petechial hemorrhage in the  right frontal lobe cortical infarct without evidence of significant hemorrhagic transformation or midline shift.  - Vessel imaging is without LVO or hemodynamically significant stenosis in the neck on MR angio.  - Presentation is concerning for an acute to subacute infarctions in the setting of impaired cerebral blood flow with hypoxia 2/2 cardiac arrest with a watershed appearance on MRI imaging versus cardioembolic 2/2 cardiac arrest due to embolic appearing infarctions on MRI review.   TEESHOWS A MASS AT AORTIC VALVE, SUSPICIOUS FOR VEGETATION. WILL CONTINUE HEPARIN DRIP IF OKAY WITH ID.   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    MRI   brain: 11/25/20 Areas of restricted diffusion in the cortex and white matter bilateral frontal, parietal, occipital, posterior temporal lobes, as well as the bilateral cerebellar hemispheres. Additional punctate focus of restricted diffusion in the left caudate head. Some of this is in a watershed distribution (series 5, image 94), while other areas are more focal. The cortical areas are associated with gyral swelling (series 12, images 15 and 20, for example). Areas of curvilinear susceptibility in the right frontal lobe in the area of infarct (series 15, image 36), likely hemorrhage  MRA   Head:  Anterior circulation: Both internal carotid arteries are patent to the termini, without stenosis or other abnormality; mildly decreased signal in the proximal cavernous carotid is felt to be artifactual. A1 segments patent. Anterior cerebral arteries are patent to their distal aspects. No significant M1 stenosis or occlusion. Normal MCA bifurcations. Distal MCA branches well perfused and symmetric.   Posterior circulation: Vertebral arteries  patent to the vertebrobasilar junction without stenosis  MRA NECK: No hemodynamically significant stenosis in the neck 2D Echo  11/23/2020 Left Ventricle: No mural thrombus - filling defects in the LV cavity noted with Definity contrast, normal size LA TEE 11/26/2020: 1.5 x 0.5 cm mobile mass associated with the non-coronary cusp of the aortic valve, suspicious for vegetation. There is associated moderate to probable severe aortic regurgitation (eccentric, multiple jets). No LAA thrombus Negative for PFO by color doppler and saline microbubble contrast Moderate concentric LVH LVEF 55-60% LDL 58 HgbA1c 6.5 VTE prophylaxis - scd    Diet  Diet NPO time specified   No antithrombotic prior to admission, was on heparin IV, now off.    Therapy recommendations:  pending Disposition:  pending   Hypertension Home meds:  metoprolol, spironolactone/hctz Stable Permissive hypertension (OK if < 220/120) but gradually normalize in 5-7 days Long-term BP goal normotensive  Hyperlipidemia Home meds:  none, resumed in hospital LDL 58, goal < 70  HgbA1c 6.5, goal < 7.0 CBGs Recent Labs    11/27/20 0342 11/27/20 0823 11/27/20 1116  GLUCAP 118* 144* 158*     SSI  Other Stroke Risk Factors      Substance abuse - UDS:  THC POSITIVE, Cocaine NONE DETECTED. Patient advised to stop using due to stroke risk.  Obesity, Body mass index is 30.42 kg/m., BMI >/= 30 associated with increased stroke risk, recommend weight loss, diet and exercise as appropriate    Hospital day # 5     To contact Stroke Continuity provider, please refer to [email or web address]. After hours, contact General Neurology

## 2020-11-27 NOTE — Progress Notes (Signed)
PCCM Interval Progress Note    Met with son and his partner at bedside for a status update/GOC discussion regarding Samantha Cooley.  Updated family regarding patient's current hospital issues, current treatment and progress. We discussed intended extubation yesterday, 10/4 and the issues precluding this including patient's brief decreased mental status, hypoxia (due to likely derecruitment), agitation with resultant hypertension and inability to wean from the ventilator.   We discussed that Samantha Cooley is quite volume overloaded based on CXR, I&Os and physical exam and that we would like to optimized her volume status with diuresis prior to another extubation attempt; this would likely occur in the next 24-48 hours.  Upon another extubation attempt, we discussed possible outcomes, including successful extubation and failed extubation. If Samantha Cooley fails extubation, we would need to decide on 1) reintubation with likely tracheostomy placement to allow for vent weaning/long term care transfer or 2) no reintubation and transition to comfort measures.  Patient's son was appropriately tearful during discussion and would like some time to consider/discuss with his family members. Patient's spouse, Samantha Cooley, was also updated on the above via phone at patient's son's request.  Will readdress GOC in the next 24-48 hours. PCCM will remain available for any questions as they arise.  Lestine Mount, PA-C [facility] 11/27/20 3:05 PM  Please see [email or web address] for pager details.  From 7A-7P if no response, please call [phone] After hours, please call ELink [phone]

## 2020-11-27 NOTE — Progress Notes (Signed)
[facility]   Reason for visit: Follow up on AV vegetation  Interval History: remains intubated, WBC 6, Tmax 100.5.  blood cultures remain ngtd.   Day 2 vancomycin + ceftriaxone Son at bedside  Physical Exam: Constitutional:  Vitals:   11/27/20 1113 11/27/20 1140  BP:  (!) 166/71  Pulse:  71  Resp:  17  Temp: (!) 100.5 F (38.1 C)   SpO2:  95%   patient appears in NAD Eyes: anicteric HENT: + ET Respiratory: respiratory effort on vent Cardiovascular: RRR GI: soft, nt, nd  Review of Systems: Unable to be assessed due to patient factors  Lab Results  Component Value Date   WBC 6.0 11/27/2020   HGB 8.5 (L) 11/27/2020   HCT 26.4 (L) 11/27/2020   MCV 101.9 (H) 11/27/2020   PLT 92 (L) 11/27/2020    Lab Results  Component Value Date   CREATININE 0.98 11/27/2020   BUN 16 11/27/2020   NA 135 11/27/2020   K 4.2 11/27/2020   CL 106 11/27/2020   CO2 22 11/27/2020    Lab Results  Component Value Date   ALT 24 11/26/2020   AST 21 11/26/2020   ALKPHOS 80 11/26/2020     Microbiology: Recent Results (from the past 240 hour(s))  Resp Panel by RT-PCR (Flu A&B, Covid) Nasopharyngeal Swab     Status: None   Collection Time: Dec 15, 2020  4:22 PM   Specimen: Nasopharyngeal Swab; Nasopharyngeal(NP) swabs in vial transport medium  Result Value Ref Range Status   SARS Coronavirus 2 by RT PCR NEGATIVE NEGATIVE Final    Comment: (NOTE) SARS-CoV-2 target nucleic acids are NOT DETECTED.  The SARS-CoV-2 RNA is generally detectable in upper respiratory specimens during the acute phase of infection. The lowest concentration of SARS-CoV-2 viral copies this assay can detect is 138 copies/mL. A negative result does not preclude SARS-Cov-2 infection and should not be used as the sole basis for treatment or other patient management decisions. A negative result may occur with  improper specimen collection/handling, submission of specimen other than  nasopharyngeal swab, presence of viral mutation(s) within the areas targeted by this assay, and inadequate number of viral copies(<138 copies/mL). A negative result must be combined with clinical observations, patient history, and epidemiological information. The expected result is Negative.  Fact Sheet for Patients:  BloggerCourse.com  Fact Sheet for Healthcare Providers:  SeriousBroker.it  This test is no t yet approved or cleared by the [place] FDA and  has been authorized for detection and/or diagnosis of SARS-CoV-2 by FDA under an Emergency Use Authorization (EUA). This EUA will remain  in effect (meaning this test can be used) for the duration of the COVID-19 declaration under Section 564(b)(1) of the Act, 21 U.S.C.section 360bbb-3(b)(1), unless the authorization is terminated  or revoked sooner.       Influenza A by PCR NEGATIVE NEGATIVE Final   Influenza B by PCR NEGATIVE NEGATIVE Final    Comment: (NOTE) The Xpert Xpress SARS-CoV-2/FLU/RSV plus assay is intended as an aid in the diagnosis of influenza from Nasopharyngeal swab specimens and should not be used as a sole basis for treatment. Nasal washings and aspirates are unacceptable for Xpert Xpress SARS-CoV-2/FLU/RSV testing.  Fact Sheet for Patients: BloggerCourse.com  Fact Sheet for Healthcare Providers: SeriousBroker.it  This test is not yet approved or cleared by the [place] FDA and has been authorized for detection and/or diagnosis of SARS-CoV-2 by FDA under an Emergency Use Authorization (EUA). This EUA  will remain in effect (meaning this test can be used) for the duration of the COVID-19 declaration under Section 564(b)(1) of the Act, 21 U.S.C. section 360bbb-3(b)(1), unless the authorization is terminated or revoked.  Performed at [facility], [address], [place], [place] [address]    Culture, blood (routine x 2)     Status: None   Collection Time: 10/25/2020  7:56 PM   Specimen: BLOOD RIGHT HAND  Result Value Ref Range Status   Specimen Description BLOOD RIGHT HAND  Final   Special Requests AEROBIC BOTTLE ONLY Blood Culture adequate volume  Final   Culture   Final    NO GROWTH 5 DAYS Performed at [facility], [address], [place], [place] [address]    Report Status 11/27/2020 FINAL  Final  Culture, blood (routine x 2)     Status: None   Collection Time: 11/10/2020  8:06 PM   Specimen: BLOOD RIGHT HAND  Result Value Ref Range Status   Specimen Description BLOOD RIGHT HAND  Final   Special Requests AEROBIC BOTTLE ONLY Blood Culture adequate volume  Final   Culture   Final    NO GROWTH 5 DAYS Performed at [facility], [address] [place], [place] [address]    Report Status 11/27/2020 FINAL  Final  MRSA Next Gen by PCR, Nasal     Status: None   Collection Time: 11/26/20 12:18 AM   Specimen: Nasal Mucosa; Nasal Swab  Result Value Ref Range Status   MRSA by PCR Next Gen NOT DETECTED NOT DETECTED Final    Comment: (NOTE) The GeneXpert MRSA Assay (FDA approved for NASAL specimens only), is one component of a comprehensive MRSA colonization surveillance program. It is not intended to diagnose MRSA infection nor to guide or monitor treatment for MRSA infections. Test performance is not FDA approved in patients less than 64 years old. Performed at [facility], [address] [place], [place] [address]   Culture, Respiratory w Gram Stain     Status: None (Preliminary result)   Collection Time: 11/27/20  9:00 AM   Specimen: Tracheal Aspirate; Respiratory  Result Value Ref Range Status   Specimen Description TRACHEAL ASPIRATE  Final   Special Requests NONE  Final   Gram Stain   Final    ABUNDANT WBC PRESENT, PREDOMINANTLY PMN RARE YEAST Performed at [facility], 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  On broad coverage with vancomycin and ceftriaxone and will continues.  Will plan on 6 weeks of IV antibiotics and will continue to monitor the cultures.    2.  CNS emboli - from #1 and on antibiotics with presumption of infectious etiology. Plan as above with prolonged IV antibiotics.    3.  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  Plan to consider surgery at a later date while treating current likely infection.

## 2020-11-27 NOTE — Progress Notes (Addendum)
NAME:  Samantha Cooley, MRN:  [id number], DOB:  1960-09-12, LOS: 5 ADMISSION DATE:  11/12/2020, CONSULTATION DATE:  10/31/2020 REFERRING MD:  Wilkie Aye - EDP CHIEF COMPLAINT:  Cardiac arrest    History of Present Illness:  60 year old woman who presented to [facility] ED via EMS 9/30 status post PEA arrest.  PMHx significant for HTN, T2DM, anxiety, depression, insomnia.  History obtained in the emergency room. Patient was her usual state of health; she had been on phone with her significant other around 2 PM. Found at 2:50PM unresponsive and pulseless by significant other.  EMS was called and significant other initiated CPR.  EMS arrival noted to be around 2:55PM. Initial rhythm PEA.  ROSC ~15 minutes.   On arrival to the emergency room, patient was intubated, requiring high FiO2. Initially hypertensive, improved with Fentanyl.  Patient would open eyes, stick her tongue out, had equal muscle tone bilaterally and would lift arms spontaneously but could not follow commands with lower extremities.  Preliminary evaluation in the ER: Glucose 372 White blood cell count 18.3 creatinine 1.75 anion gap metabolic acidosis with serum bicarbonate of 11, lactate of greater than 9, troponin 588. Bedside ultrasound was negative for pericardial effusion RV function normal, LV function difficult to assess. Seen by Cardiology, did not think this was consistent with STEMI.  PCCM admitted to ICU for further management post-arrest.  Pertinent Medical History:  HTN, diabetes, anxiety, depression, insomnia.  Significant Hospital Events: Including procedures, antibiotic start and stop dates in addition to other pertinent events   9/30 admitted s/p arrest, intubated in field, seen by cardiology and felt not primary ST elevation event.  Suspected more metabolically driven.  On presentation had severe anion gap metabolic acidosis, hyperglycemia, and encephalopathy but was following some simple commands such as sticking tongue out and  nodding appropriately. Vanc/zosyn started. CT head neck and chest ordered.  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 10/2 Troponin uptrended to 9756, heparin gtt started for ?ACS. 10/3 Cardiology consult for ?ACS, further recs. New RUE weakness/neglect, MRI Brain with multiple areas of stroke (cardioembolic vs. watershed), no large vessel occlusion, Neuro consult. 10/4 TEE per Cardiology, stable neuro exam. Plan for wean, ?extubation post-TEE. 10/5 TEE demonstrating 1.5 x 0.5cm mobile mass on the non-coronary cusp of the AV. ID/TCTS consulted. Prepared for extubation, but upon sitting patient up for attempt, derecruited/desaturated to 70s and became hypertensive with briefly decreased mental status; recovered with bagging via ETT. Extubation aborted.  Interim History / Subjective:  No significant events overnight TEE +mobile mass on the non-coronary cusp of the AV ID/TCTS consulted Prepared for extubation, but upon sitting patient up for attempt, derecruited/desaturated to 70s and became hypertensive with briefly decreased mental status; recovered with bagging via ETT Extubation aborted Family updated via phone ?Repeat extubation attempt today, assess for seizure if similar presentation GOC discussion with family regarding extubation  Objective:  Blood pressure (!) 144/63, pulse (!) 57, temperature (!) 100.4 F (38 C), temperature source Axillary, resp. rate 16, height 5\' 4"  (1.626 m), weight 80.4 kg, SpO2 97 %.    Vent Mode: PRVC FiO2 (%):  [40 %-100 %] 40 % Set Rate:  [16 bmp] 16 bmp Vt Set:  [440 mL] 440 mL PEEP:  [5 cmH20-8 cmH20] 8 cmH20 Pressure Support:  [15 cmH20] 15 cmH20 Plateau Pressure:  [13 cmH20-21 cmH20] 13 cmH20   Intake/Output Summary (Last 24 hours) at 11/27/2020 0749 Last data filed at 11/27/2020 0600 Gross per 24 hour  Intake 2704.12 ml  Output 685 ml  Net 2019.12 ml    Filed Weights   11/24/20 0500 11/26/20 0440 11/27/20 0427  Weight: 80 kg 77.9 kg  80.4 kg   Physical Examination: General: Acute-on-chronically ill-appearing middle aged woman in NAD. HEENT: [place]/AT, anicteric sclera, PERRL, moist mucous membranes. Neuro:  Drowsy, lightly sedated, wakes to voice.  Responds to verbal stimuli. Following commands intermittently. Unilateral R-sided neglect noted. +Cough and +Gag  CV: RRR, no m/g/r. PULM: Breathing even and unlabored on vent (PEEP 5-8, FiO2 40%). Lung fields with faint crackles at bilateral bases. GI: Soft, nontender, nondistended. Hypoactive bowel sounds. Extremities: Bilateral symmetric 1+ LE edema noted. Skin: Warm/dry, no rashes.  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 Initially seen by Dr. Eldridge Dace on admission for STEMI r/o; was not felt to be STEMI at that time (troponin 588). Troponin uptrend to 9756 10/2AM, prompting heparin gtt initiation for ?ACS. 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 - Cardiology following, appreciate recs - TEE demonstrating 1.5 x 0.5cm mobile mass on AV - ID consulted for endocarditis, recommend vanc/ceftriaxone at present with duration 6 weeks - TCTS consulted, would not offer/recommend surgical intervention until patient has recovered from acute stroke/PEA arrest and undergone rehabilitation   Acute hypoxic respiratory failure and aspiration pneumonia requiring mechanical ventilation Prepared for extubation 10/4, but upon sitting patient up for attempt, derecruited/desaturated to 70s and became hypertensive with briefly decreased mental status; recovered with bagging via ETT. - Continue full vent support (4-8cc/kg IBW) - Wean attempt 10/5AM, possible repeat extubation attempt pending progress - If  similar instance to prior, would obtain EEG; GOC discussion with family today regarding possible 1-way extubation vs. 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 Concern with inability to move RUE; able to move BLE and LUE on exam. CT Head 9/30 NAICA on admission. MRI Brain with multiple areas of ischemic stroke, cardioembolic etiology vs. watershed infarct. MRA Head/Neck without large vessel occlusion. - Neuro following, appreciate recs - Stroke prophylaxis; consider antiplatelet therapy, heparin gtt off - Goal MAP > 65, avoid hypotension - Neuroprotective measures: HOB > 30 degrees, normoglycemia, normothermia, electrolytes WNL - PT/OT/SLP once extubated  T2DM Stress hyperglycemia Beta-hydroxybutyrate negative. History of T2DM, home regimen includes metformin. - CBG Q4H - Basal Levemir BID - SSI  AKI, improving Suspect secondary to volume depletion and organ hypoperfusion. - Trend BMP - Replete electrolytes as indicated - Monitor I&Os in the setting of diuresis - Avoid nephrotoxic agents as able - Ensure adequate renal perfusion  Best Practice: (right click and "Reselect all SmartList Selections" daily)   Diet/type: NPO DVT prophylaxis: SCD GI prophylaxis: PPI Lines: N/A Foley:  Yes, and it is still needed Code Status:  full code Last date of multidisciplinary goals of care discussion [Pending]  Critical care time: 35 minutes   Tim Lair, PA-C [facility] 11/27/20 7:49 AM

## 2020-11-27 NOTE — Progress Notes (Signed)
OT Cancellation Note  Patient Details Name: Samantha Cooley MRN: [id number] DOB: 1960-05-31   Cancelled Treatment:    Reason Eval/Treat Not Completed: Medical issues which prohibited therapy (Pt with non tunneled femoral HD cath.)   Evern Bio 11/27/2020, 1:36 PM Martie Round, OTR/L Acute Rehabilitation Services Pager: [phone] Office: [phone]

## 2020-11-27 NOTE — Progress Notes (Signed)
PT Cancellation Note  Patient Details Name: Samantha Cooley MRN: [id number] DOB: 1960/06/20   Cancelled Treatment:    Reason Eval/Treat Not Completed: Patient not medically ready; patient intubated and with non-tunneled HD cath R femoral vein.  RN aware needs to be addressed prior to mobilization.  Will attempt again another day.    Elray Mcgregor 11/27/2020, 2:47 PM Sheran Lawless, PT Acute Rehabilitation Services [phone] Office:[phone] 11/27/2020

## 2020-11-27 NOTE — Progress Notes (Addendum)
Progress Note  Patient Name: Samantha Cooley Date of Encounter: 11/27/2020  [facility] Cardiologist: Elouise Munroe, MD   Subjective   Patient awakens on vent. Right hemiparesis.   Inpatient Medications    Scheduled Meds:  chlorhexidine gluconate (MEDLINE KIT)  15 mL Mouth Rinse BID   Chlorhexidine Gluconate Cloth  6 each Topical Daily   docusate  100 mg Per Tube BID   escitalopram  20 mg Per Tube QHS   feeding supplement (PROSource TF)  45 mL Per Tube TID   free water  30 mL Per Tube Q4H   heparin  5,000 Units Subcutaneous Q8H   insulin aspart  2-6 Units Subcutaneous Q4H   insulin detemir  12 Units Subcutaneous Q12H   ipratropium-albuterol  3 mL Nebulization BID   mouth rinse  15 mL Mouth Rinse 10 times per day   pantoprazole (PROTONIX) IV  40 mg Intravenous Q24H   polyethylene glycol  17 g Per Tube Daily   sodium chloride flush  10-40 mL Intracatheter Q12H   Continuous Infusions:  sodium chloride Stopped (11/25/20 2155)   sodium chloride     sodium chloride     cefTRIAXone (ROCEPHIN)  IV Stopped (11/26/20 1827)   dexmedetomidine (PRECEDEX) IV infusion 0.4 mcg/kg/hr (11/27/20 0600)   dextrose 5 % and 0.9% NaCl 50 mL/hr at 11/27/20 0601   feeding supplement (VITAL HIGH PROTEIN) 1,000 mL (11/24/20 2039)   fentaNYL infusion INTRAVENOUS 100 mcg/hr (11/27/20 0600)   norepinephrine (LEVOPHED) Adult infusion Stopped (11/25/20 1802)   vancomycin     PRN Meds: acetaminophen (TYLENOL) oral liquid 160 mg/5 mL, dextrose, fentaNYL, hydrALAZINE, LORazepam, midazolam, sodium chloride flush   Vital Signs    Vitals:   11/27/20 0427 11/27/20 0500 11/27/20 0600 11/27/20 0739  BP:    (!) 144/63  Pulse:  65 62 (!) 57  Resp:  _0 Temp:      TempSrc:      SpO2:  92% 95% 97%  Weight: 80.4 kg     Height:        Intake/Output Summary (Last 24 hours) at 11/27/2020 0755 Last data filed at 11/27/2020 0600 Gross per 24 hour  Intake 2704.12 ml  Output 685 ml  Net 2019.12  ml    Last 3 Weights 11/27/2020 11/26/2020 11/24/2020  Weight (lbs) 177 lb 4 oz 171 lb 11.8 oz 176 lb 5.9 oz  Weight (kg) 80.4 kg 77.9 kg 80 kg      Telemetry    NSR - Personally Reviewed  ECG    NSR with LBBB - Personally Reviewed  Physical Exam   GEN: No acute distress.  On vent Neck: No JVD Cardiac: RRR, no murmurs, rubs, or gallops.  Respiratory: Clear to auscultation bilaterally. GI: Soft, nontender, distended MS: No edema; No deformity. Neuro:  right hemiparesis Psych: Normal affect   Labs    High Sensitivity Troponin:   Recent Labs  Lab 10/30/2020 1610 11/06/2020 2020 11/23/20 0758 11/24/20 0357  TROPONINIHS 588* 1,684* 9,756* 2,818*      Chemistry Recent Labs  Lab 11/24/20 0357 11/24/20 1645 11/25/20 0341 11/26/20 0323 11/27/20 0340  NA 132*  --  132* 134* 135  K 4.1  --  3.9 3.6 4.2  CL 106  --  104 102 106  CO2 16*  --  19* 23 22  GLUCOSE 178*  --  184* 110* 117*  BUN 21  --  _1 CREATININE 1.30*  --  1.12*  1.12* 0.98  CALCIUM 6.9*  --  7.4* 7.8* 7.9*  MG 1.6* 1.9  --   --  2.1  PROT 4.3*  --  4.8* 5.1*  --   ALBUMIN 2.3*  --  2.4* 2.3*  --   AST 51*  --  31 21  --   ALT 48*  --  33 24  --   ALKPHOS 70  --  81 80  --   BILITOT 0.7  --  0.7 0.8  --   GFRNONAA 46*  --  56* 56* >60  ANIONGAP 10  --  _0 Lipids  Recent Labs  Lab 11/25/20 1857 11/26/20 0323  CHOL 139  --   TRIG 111 109  HDL 59  --   LDLCALC 58  --   CHOLHDL 2.4  --      Hematology Recent Labs  Lab 11/25/20 0341 11/26/20 0323 11/27/20 0340  WBC 7.4 6.7 6.0  RBC 2.97* 2.79* 2.59*  HGB 9.9* 9.2* 8.5*  HCT 30.0* 28.1* 26.4*  MCV 101.0* 100.7* 101.9*  MCH 33.3 33.0 32.8  MCHC 33.0 32.7 32.2  RDW 13.8 13.8 14.0  PLT 61* 61* 92*    Thyroid No results for input(s): TSH, FREET4 in the last 168 hours.  BNPNo results for input(s): BNP, PROBNP in the last 168 hours.  DDimer No results for input(s): DDIMER in the last 168 hours.   Radiology    MR ANGIO  HEAD WO CONTRAST  Result Date: 11/25/2020 CLINICAL DATA:  Neuro deficit, stroke suspected EXAM: MRI HEAD WITHOUT CONTRAST MRA HEAD WITHOUT CONTRAST MRA NECK WITHOUT AND WITH CONTRAST TECHNIQUE: Multiplanar, multi-echo pulse sequences of the brain and surrounding structures were acquired without intravenous contrast. Angiographic images of the Circle of Willis were acquired using MRA technique without intravenous contrast. Angiographic images of the neck were acquired using MRA technique without and with intravenous contrast. Carotid stenosis measurements (when applicable) are obtained utilizing NASCET criteria, using the distal internal carotid diameter as the denominator. CONTRAST:  62m GADAVIST GADOBUTROL 1 MMOL/ML IV SOLN COMPARISON:  And prior MRI, correlation is made with CT head 11/04/2020 FINDINGS: MRI HEAD FINDINGS Brain: Areas of restricted diffusion in the cortex and white matter bilateral frontal, parietal, occipital, posterior temporal lobes, as well as the bilateral cerebellar hemispheres. Additional punctate focus of restricted diffusion in the left caudate head. Some of this is in a watershed distribution (series 5, image 94), while other areas are more focal. The cortical areas are associated with gyral swelling (series 12, images 15 and 20, for example). Areas of curvilinear susceptibility in the right frontal lobe in the area of infarct (series 15, image 36), likely hemorrhage. No extra-axial fluid collection. No mass, mass effect, or midline shift. Remote left thalamic lacunar infarct. Vascular: See MRA, below. Skull and upper cervical spine: Normal marrow signal. Sinuses/Orbits: Air-fluid levels in the right greater than left maxillary sinus, with trace fluid in the left sphenoid sinus. NG tube seen in the nasal cavity. Partially visualized endotracheal tube in the oral cavity. The orbits are unremarkable. Other: Fluid in the bilateral mastoid air cells. MRA HEAD FINDINGS Anterior circulation:  Both internal carotid arteries are patent to the termini, without stenosis or other abnormality; mildly decreased signal in the proximal cavernous carotid is felt to be artifactual. A1 segments patent. Anterior cerebral arteries are patent to their distal aspects. No significant M1 stenosis or occlusion. Normal MCA bifurcations. Distal MCA branches well perfused and symmetric.  Posterior circulation: Vertebral arteries patent to the vertebrobasilar junction without stenosis. Focal area of poor flow visualization in the basilar artery (series 5, image 73), which may be artifactual. The basilar is otherwise patent to its distal aspect. Superior cerebral arteries patent bilaterally. PCAs well perfused to their distal aspects without stenosis.Posterior communicating arteries are visualized bilaterally. Anatomic variants: None significant. MRA NECK FINDINGS Aortic arch: Normal three-vessel arch. No significant stenosis or aneurysm. Right carotid system: Patent, without hemodynamically significant stenosis, occlusion, or aneurysm. Plaque at the bifurcation. Left carotid system: Patent, without hemodynamically significant stenosis, occlusion, or aneurysm. Plaque at the bifurcation. Vertebral arteries: Limited visualization of the origin of the right vertebral artery secondary to artifact. The vertebral arteries are otherwise patent, without significant stenosis, occlusion, or aneurysm. Other: None IMPRESSION: 1. Multiple areas of restricted diffusion throughout the bilateral cerebral and cerebellar hemispheres, some of which are in a watershed pattern, while others are more likely to be embolic, given different vascular territories. Likely petechial hemorrhage in a right frontal lobe cortical infarct, but no evidence of significant hemorrhagic transformation or midline shift. 2. No large vessel occlusion in the intracranial vasculature. 3. No hemodynamically significant stenosis in the neck. Multiple attempts were made to  contact a provider taking care of this patient to convey these results. Electronically Signed   By: Merilyn Baba M.D.   On: 11/25/2020 17:32   MR ANGIO NECK W WO CONTRAST  Result Date: 11/25/2020 CLINICAL DATA:  Neuro deficit, stroke suspected EXAM: MRI HEAD WITHOUT CONTRAST MRA HEAD WITHOUT CONTRAST MRA NECK WITHOUT AND WITH CONTRAST TECHNIQUE: Multiplanar, multi-echo pulse sequences of the brain and surrounding structures were acquired without intravenous contrast. Angiographic images of the Circle of Willis were acquired using MRA technique without intravenous contrast. Angiographic images of the neck were acquired using MRA technique without and with intravenous contrast. Carotid stenosis measurements (when applicable) are obtained utilizing NASCET criteria, using the distal internal carotid diameter as the denominator. CONTRAST:  75m GADAVIST GADOBUTROL 1 MMOL/ML IV SOLN COMPARISON:  And prior MRI, correlation is made with CT head 11/13/2020 FINDINGS: MRI HEAD FINDINGS Brain: Areas of restricted diffusion in the cortex and white matter bilateral frontal, parietal, occipital, posterior temporal lobes, as well as the bilateral cerebellar hemispheres. Additional punctate focus of restricted diffusion in the left caudate head. Some of this is in a watershed distribution (series 5, image 94), while other areas are more focal. The cortical areas are associated with gyral swelling (series 12, images 15 and 20, for example). Areas of curvilinear susceptibility in the right frontal lobe in the area of infarct (series 15, image 36), likely hemorrhage. No extra-axial fluid collection. No mass, mass effect, or midline shift. Remote left thalamic lacunar infarct. Vascular: See MRA, below. Skull and upper cervical spine: Normal marrow signal. Sinuses/Orbits: Air-fluid levels in the right greater than left maxillary sinus, with trace fluid in the left sphenoid sinus. NG tube seen in the nasal cavity. Partially visualized  endotracheal tube in the oral cavity. The orbits are unremarkable. Other: Fluid in the bilateral mastoid air cells. MRA HEAD FINDINGS Anterior circulation: Both internal carotid arteries are patent to the termini, without stenosis or other abnormality; mildly decreased signal in the proximal cavernous carotid is felt to be artifactual. A1 segments patent. Anterior cerebral arteries are patent to their distal aspects. No significant M1 stenosis or occlusion. Normal MCA bifurcations. Distal MCA branches well perfused and symmetric. Posterior circulation: Vertebral arteries patent to the vertebrobasilar junction without stenosis. Focal area of poor  flow visualization in the basilar artery (series 5, image 73), which may be artifactual. The basilar is otherwise patent to its distal aspect. Superior cerebral arteries patent bilaterally. PCAs well perfused to their distal aspects without stenosis.Posterior communicating arteries are visualized bilaterally. Anatomic variants: None significant. MRA NECK FINDINGS Aortic arch: Normal three-vessel arch. No significant stenosis or aneurysm. Right carotid system: Patent, without hemodynamically significant stenosis, occlusion, or aneurysm. Plaque at the bifurcation. Left carotid system: Patent, without hemodynamically significant stenosis, occlusion, or aneurysm. Plaque at the bifurcation. Vertebral arteries: Limited visualization of the origin of the right vertebral artery secondary to artifact. The vertebral arteries are otherwise patent, without significant stenosis, occlusion, or aneurysm. Other: None IMPRESSION: 1. Multiple areas of restricted diffusion throughout the bilateral cerebral and cerebellar hemispheres, some of which are in a watershed pattern, while others are more likely to be embolic, given different vascular territories. Likely petechial hemorrhage in a right frontal lobe cortical infarct, but no evidence of significant hemorrhagic transformation or midline  shift. 2. No large vessel occlusion in the intracranial vasculature. 3. No hemodynamically significant stenosis in the neck. Multiple attempts were made to contact a provider taking care of this patient to convey these results. Electronically Signed   By: Merilyn Baba M.D.   On: 11/25/2020 17:32   MR BRAIN WO CONTRAST  Result Date: 11/25/2020 CLINICAL DATA:  Neuro deficit, stroke suspected EXAM: MRI HEAD WITHOUT CONTRAST MRA HEAD WITHOUT CONTRAST MRA NECK WITHOUT AND WITH CONTRAST TECHNIQUE: Multiplanar, multi-echo pulse sequences of the brain and surrounding structures were acquired without intravenous contrast. Angiographic images of the Circle of Willis were acquired using MRA technique without intravenous contrast. Angiographic images of the neck were acquired using MRA technique without and with intravenous contrast. Carotid stenosis measurements (when applicable) are obtained utilizing NASCET criteria, using the distal internal carotid diameter as the denominator. CONTRAST:  7m GADAVIST GADOBUTROL 1 MMOL/ML IV SOLN COMPARISON:  And prior MRI, correlation is made with CT head 11/02/2020 FINDINGS: MRI HEAD FINDINGS Brain: Areas of restricted diffusion in the cortex and white matter bilateral frontal, parietal, occipital, posterior temporal lobes, as well as the bilateral cerebellar hemispheres. Additional punctate focus of restricted diffusion in the left caudate head. Some of this is in a watershed distribution (series 5, image 94), while other areas are more focal. The cortical areas are associated with gyral swelling (series 12, images 15 and 20, for example). Areas of curvilinear susceptibility in the right frontal lobe in the area of infarct (series 15, image 36), likely hemorrhage. No extra-axial fluid collection. No mass, mass effect, or midline shift. Remote left thalamic lacunar infarct. Vascular: See MRA, below. Skull and upper cervical spine: Normal marrow signal. Sinuses/Orbits: Air-fluid  levels in the right greater than left maxillary sinus, with trace fluid in the left sphenoid sinus. NG tube seen in the nasal cavity. Partially visualized endotracheal tube in the oral cavity. The orbits are unremarkable. Other: Fluid in the bilateral mastoid air cells. MRA HEAD FINDINGS Anterior circulation: Both internal carotid arteries are patent to the termini, without stenosis or other abnormality; mildly decreased signal in the proximal cavernous carotid is felt to be artifactual. A1 segments patent. Anterior cerebral arteries are patent to their distal aspects. No significant M1 stenosis or occlusion. Normal MCA bifurcations. Distal MCA branches well perfused and symmetric. Posterior circulation: Vertebral arteries patent to the vertebrobasilar junction without stenosis. Focal area of poor flow visualization in the basilar artery (series 5, image 73), which may be artifactual. The basilar is  otherwise patent to its distal aspect. Superior cerebral arteries patent bilaterally. PCAs well perfused to their distal aspects without stenosis.Posterior communicating arteries are visualized bilaterally. Anatomic variants: None significant. MRA NECK FINDINGS Aortic arch: Normal three-vessel arch. No significant stenosis or aneurysm. Right carotid system: Patent, without hemodynamically significant stenosis, occlusion, or aneurysm. Plaque at the bifurcation. Left carotid system: Patent, without hemodynamically significant stenosis, occlusion, or aneurysm. Plaque at the bifurcation. Vertebral arteries: Limited visualization of the origin of the right vertebral artery secondary to artifact. The vertebral arteries are otherwise patent, without significant stenosis, occlusion, or aneurysm. Other: None IMPRESSION: 1. Multiple areas of restricted diffusion throughout the bilateral cerebral and cerebellar hemispheres, some of which are in a watershed pattern, while others are more likely to be embolic, given different  vascular territories. Likely petechial hemorrhage in a right frontal lobe cortical infarct, but no evidence of significant hemorrhagic transformation or midline shift. 2. No large vessel occlusion in the intracranial vasculature. 3. No hemodynamically significant stenosis in the neck. Multiple attempts were made to contact a provider taking care of this patient to convey these results. Electronically Signed   By: Merilyn Baba M.D.   On: 11/25/2020 17:32   DG Chest Port 1 View  Result Date: 11/26/2020 CLINICAL DATA:  Hypoxia. EXAM: PORTABLE CHEST 1 VIEW COMPARISON:  November 24, 2020. FINDINGS: Stable cardiomediastinal silhouette. Endotracheal tube is in good position. Bibasilar atelectasis or edema is noted with associated pleural effusions. Bony thorax is unremarkable. IMPRESSION: Bibasilar atelectasis or edema is noted with associated pleural effusions. Electronically Signed   By: Marijo Conception M.D.   On: 11/26/2020 15:41   ECHO TEE  Result Date: 11/26/2020    TRANSESOPHOGEAL ECHO REPORT   Patient Name:   Samantha Cooley [facility] Date of Exam: 11/26/2020 Medical Rec #:  [id number]      Height:       64.0 in Accession #:    [id number]     Weight:       171.7 lb Date of Birth:  December 04, 1960       BSA:          1.834 m Patient Age:    60 years       BP:           153/68 mmHg Patient Gender: F              HR:           61 bpm. Exam Location:  Inpatient Procedure: Transesophageal Echo, 3D Echo, Color Doppler, Cardiac Doppler and            Saline Contrast Bubble Study Indications:     Stroke  History:         Patient has prior history of Echocardiogram examinations, most                  recent 11/23/2020. Risk Factors:Hypertension and Diabetes.  Sonographer:     Bernadene Person RDCS Referring Phys:  [id number] Nadean Corwin HILTY Diagnosing Phys: Lyman Bishop MD PROCEDURE: After discussion of the risks and benefits of a TEE, an informed consent was obtained from the patient. The transesophogeal probe was passed without difficulty  through the esophogus of the patient. Sedation performed by different physician. The patient's vital signs; including heart rate, blood pressure, and oxygen saturation; remained stable throughout the procedure. The patient developed no complications during the procedure. IMPRESSIONS  1. Left ventricular ejection fraction, by estimation, is 55 to 60%. The left ventricle has normal  function. There is moderate left ventricular hypertrophy.  2. Right ventricular systolic function is normal. The right ventricular size is normal.  3. No left atrial/left atrial appendage thrombus was detected.  4. The mitral valve is grossly normal. Mild mitral valve regurgitation. No evidence of mitral stenosis.  5. There is a 1.5 x 0.5 cm mobile mass associated with the non-coronary cusp of the aortic valve with thickening of the remaining leaflets, this is suggestive of vegetation. The aortic valve is tricuspid. Aortic valve regurgitation is severe and eccentric with multiple jets and a wide vena contracta.  6. Agitated saline contrast bubble study was negative, with no evidence of any interatrial shunt. 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 FINDINGS  Left Ventricle: Left ventricular ejection fraction, by estimation, is 55 to 60%. The left ventricle has normal function. The left ventricular internal cavity size was normal in size. There is moderate left ventricular hypertrophy. Right Ventricle: The right ventricular size is normal. No increase in right ventricular wall thickness. Right ventricular systolic function is normal. Left Atrium: Left atrial size was normal in size. No left atrial/left atrial appendage thrombus was detected. Right Atrium: Right atrial size was normal in size. Pericardium: There is no evidence  of pericardial effusion. Mitral Valve: The mitral valve is grossly normal. Mild mitral valve regurgitation. No evidence of mitral valve stenosis. Tricuspid Valve: The tricuspid valve is grossly normal. Tricuspid valve regurgitation is trivial. Aortic Valve: There is a 1.5 x 0.5 cm mobile mass associated with the non-coronary cusp of the aortic valve with thickening of the remaining leaflets, this is suggestive of vegetation. The aortic valve is tricuspid. Aortic valve regurgitation is severe. Pulmonic Valve: The pulmonic valve was grossly normal. Pulmonic valve regurgitation is trivial. Aorta: The aortic root and ascending aorta are structurally normal, with no evidence of dilitation. IAS/Shunts: No atrial level shunt detected by color flow Doppler. Agitated saline contrast was given intravenously to evaluate for intracardiac shunting. Agitated saline contrast bubble study was negative, with no evidence of any interatrial shunt. Lyman Bishop MD Electronically signed by Lyman Bishop MD Signature Date/Time: 11/26/2020/2:41:09 PM    Final     Cardiac Studies   Echo: IMPRESSIONS     1. No mural thrombus - filling defects in the LV cavity noted with  Definity contrast, suspect related to hypertrophied papillary muscle and  subchordal structures. There also appears to be color flow across the  basal septum concerning for a membranous  VSD or possible eccentric AI jet. Left ventricular ejection fraction, by  estimation, is 55 to 60%. The left ventricle has normal function. The left  ventricle has no regional wall motion abnormalities. There is moderate  left ventricular hypertrophy. Left  ventricular diastolic parameters are consistent with Grade I diastolic  dysfunction (impaired relaxation).   2. Right ventricular systolic function is low normal. The right  ventricular size is normal.   3. The mitral valve was not well visualized. No evidence of mitral valve  regurgitation.   4. The aortic valve was  not well visualized. There is moderate  calcification of the aortic valve. Aortic valve regurgitation is mild.  Mild to moderate aortic valve sclerosis/calcification is present, without  any evidence of aortic stenosis. Aortic  regurgitation PHT measures 495 msec. Aortic valve mean gradient measures  4.0 mmHg.   Comparison(s): No prior  Echocardiogram.   TEE 11/26/20: IMPRESSIONS     1. Left ventricular ejection fraction, by estimation, is 55 to 60%. The  left ventricle has normal function. There is moderate left ventricular  hypertrophy.   2. Right ventricular systolic function is normal. The right ventricular  size is normal.   3. No left atrial/left atrial appendage thrombus was detected.   4. The mitral valve is grossly normal. Mild mitral valve regurgitation.  No evidence of mitral stenosis.   5. There is a 1.5 x 0.5 cm mobile mass associated with the non-coronary  cusp of the aortic valve with thickening of the remaining leaflets, this  is suggestive of vegetation. The aortic valve is tricuspid. Aortic valve  regurgitation is severe and  eccentric with multiple jets and a wide vena contracta.   6. Agitated saline contrast bubble study was negative, with no evidence  of any interatrial shunt.   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   Patient Profile     60 y.o. female with history of HTN admitted on 11/18/2020 post out of hospital cardiac arrest. Found unresponsive. Treated with CPR and several rounds of epinephrine with ROSC. No arrhythmia noted.  Assessment & Plan    Out of hospital cardiac arrest. Seen initially by Dr Irish Lack. No documented arrhythmia. No ST elevation on Ecg with LBBB. Not felt to warrant emergent cardiac cath. Troponin increased to  9000 c/w arrest. Ecg without serial changes. EF normal by Echo. Doubt acute ischemic or arrhythmic cause. At this point with continued vent support and acute CVA further ischemic work up is not planned. Could reconsider later depending on recovery.  Acute respiratory failure secondary to #1. Per CCM. Failed vent wean  Acute embolic/watershed CVA  with right hemiparesis. Appreciate Neuro input. On IV heparin. TEE with evidence of AV vegetation. Will continue Heparin pending Neuro input. Would hold off on Plavix pending CT surgery evaluation. ASA is listed as allergy but unclear what type of reaction in the past.  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 Really don't hear much of a murmur on exam. Blood cultures negative but obtained after receiving Vanc. Will need 6 weeks of IV antibiotics. CT surgery consulted. May eventually need AVR but surgical risk would be high now with acute stroke. Unclear how this is related to her arrest.      For questions or updates, please contact [place] Please consult [email or web address] for contact info under        Signed, Zacharie Portner [place], MD  11/27/2020, 7:55 AM

## 2020-11-27 NOTE — Progress Notes (Signed)
SLP Cancellation Note  Patient Details Name: Samantha Cooley MRN: [id number] DOB: Feb 08, 1961   Cancelled treatment:       Reason Eval/Treat Not Completed: Patient not medically ready (Pt is intubated at this time and per PCCM's note today, the plan is to defer extubation today. SLP will follow up on subsequent date.)  [name] I. Vear Clock, MS, CCC-SLP [facility] Office number [phone] Pager [phone]  [name] 11/27/2020, 11:16 AM

## 2020-11-27 NOTE — Consult Note (Addendum)
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  History of Present Illness:     Samantha Cooley is a 60 year old female with past medical history of hypertension, type 2 diabetes mellitus with admission A1c of 6.5, dermatofibroma, chronic pain syndrome, anxiety, and migraine.  She was admitted to the hospital on 11/06/2020 after suffering an out of hospital cardiac arrest.  The patient spoke to her boyfriend on the phone at around 2 PM on the afternoon of 10/31/2020 and was later found by him at 2:50pm in her home unresponsive.  He summoned EMS and initiated CPR.  After the arrival of EMS, she was intubated and resuscitation continued.  She had return of spontaneous circulation after approximately 15 minutes.  She was brought to the emergency room at [facility].  Initial EKG showed left bundle branch block but no evidence of ischemia.  The cardiology service was consulted but there was not felt to be an immediate need to proceed to the Cath Lab.  At that time, she was awake and following commands.  She was admitted to the ICU.  She was started on empiric vancomycin and Zosyn for suspected aspiration pneumonitis.  Blood cultures obtained after initiation of antibiotics have remained negative.  Initial laboratory evaluation suggested acute kidney injury with creatinine 1.75 and acute liver injury with elevations in liver enzymes.  Chest x-ray showed diffuse pulmonary Pacitti's.  CT of the head and neck was negative for any acute abnormality.  On the following day, further work-up included a transthoracic echocardiogram that showed an  ejection fraction of 55%.  Right ventricular function was normal.  Mild aortic insufficiency was present along with moderate aortic valve calcification.  There was question of a membranous VSD on this study.  Ventilator support was continued as she did not meet vent weaning parameters on several trials.  Nutritional support was initiated.    On the third day following admission, it came to the attention of her providers that she had worsening right upper extremity weakness that had apparently been present to a lesser degree for a few days.  Neurology consult was requested.  Work-up included MRA of the head and neck.  This demonstrated multiple areas of restricted diffusion in the bilateral cerebral and cerebellar hemispheres.  The pattern of distribution was suggestive of both watershed and embolic lesions.  She was started on a heparin infusion.  Transesophageal echo to rule out mural thrombus was recommended and performed on 11/26/2020.  Mural thrombus was ruled out but the study demonstrated a 1.5 x 0.5 cm mobile mass on the non-coronary cusp of the aortic valve.  Aortic insufficiency was now severe with an eccentric jet.  Ejection fraction remained at 55 to 60% with moderate left ventricular hypertrophy.  Right ventricular function was normal.  Currently, the patient remains intubated and mechanically ventilated.  She is lightly sedated with Precedex and fentanyl. She will awaken to voice and attempts to sit up.  She has right hemiparesis.  We are asked to evaluate Samantha Cooley for potential surgical management of her aortic  valve vegetation with severe aortic insufficiency,  Current Activity/ Functional Status:    Zubrod Score: At the time of this evaluation this patient's most appropriate activity status/level should be described as: []     0    Normal activity, no symptoms []     1    Restricted in physical strenuous activity but ambulatory, able to do out light work []     2    Ambulatory and capable  of self care, unable to do work activities, up and about                 more than 50%  Of the time                            []     3    Only limited self care, in bed greater than 50% of waking hours [x]     4    Completely disabled, no self care, confined to bed or chair []     5    Moribund  Past Medical History:  Diagnosis Date   Diabetes ([place])    HTN (hypertension)     Past Surgical History:  Procedure Laterality Date   ABDOMINAL HYSTERECTOMY     CESAREAN SECTION      Social History   Tobacco Use  Smoking Status Every Day   Types: E-cigarettes, Cigarettes  Smokeless Tobacco Former    Social History   Substance and Sexual Activity  Alcohol Use Yes     Allergies  Allergen Reactions   Aspirin    Aspirin Buffered Other (See Comments)    unspecified   Sertraline Other (See Comments)    Face swelled up.   Sumatriptan Other (See Comments)    In a fog.  Like a zombie.    Current Facility-Administered Medications  Medication Dose Route Frequency Provider Last Rate Last Admin   0.9 %  sodium chloride infusion   Intravenous Continuous Chesley Mires, MD   Stopped at 11/25/20 2155   0.9 %  sodium chloride infusion  250 mL Intravenous Continuous Anders Simmonds, MD       0.9 %  sodium chloride infusion  250 mL Intravenous Continuous Anders Simmonds, MD       acetaminophen (TYLENOL) 160 MG/5ML solution 650 mg  650 mg Per Tube Q6H PRN Chesley Mires, MD   650 mg at 11/24/20 1656   cefTRIAXone (ROCEPHIN) 2 g in sodium chloride 0.9 % 100 mL IVPB  2 g Intravenous Q24H [place] Crane M, PA-C   Stopped at 11/26/20 1827   chlorhexidine gluconate (MEDLINE KIT) (PERIDEX) 0.12 % solution 15 mL  15 mL Mouth Rinse BID Kipp Brood, MD   15 mL at 11/27/20 0907   Chlorhexidine Gluconate Cloth 2 % PADS 6 each  6 each Topical Daily Chesley Mires, MD   6 each at 11/26/20 1000   dexmedetomidine (PRECEDEX) 400 MCG/100ML (4 mcg/mL) infusion  0.4-1.2 mcg/kg/hr Intravenous Titrated [place] Crane M, PA-C 23.4 mL/hr at 11/27/20 1031 1.2 mcg/kg/hr at 11/27/20 1031   dextrose 50 % solution 0-50 mL  0-50 mL Intravenous PRN Chesley Mires, MD       docusate (COLACE) 50 MG/5ML liquid 100 mg  100 mg Per Tube BID Chesley Mires, MD   100 mg at 11/26/20 0901   escitalopram (LEXAPRO) tablet 20 mg  20 mg Per Tube QHS Kipp Brood, MD   20 mg at 11/25/20 2152   feeding  supplement (PROSource TF) liquid 45 mL  45 mL Per Tube TID Kipp Brood, MD   45 mL at 11/26/20 1000   feeding supplement (VITAL HIGH PROTEIN) liquid 1,000 mL  1,000 mL Per Tube Continuous Kipp Brood, MD 40 mL/hr at 11/24/20 2039 1,000 mL at 11/24/20 2039   fentaNYL (SUBLIMAZE) bolus via infusion 50-100 mcg  50-100 mcg Intravenous Q15 min PRN Chesley Mires, MD   100 mcg at 11/24/20 1128   fentaNYL 25104mg in NS 2535m(1078mml) infusion-PREMIX  50-200 mcg/hr Intravenous Continuous SooChesley MiresD 12.5 mL/hr at 11/27/20 1013 125 mcg/hr at 11/27/20 1013   free water 30 mL  30 mL Per Tube Q4H Agarwala, RavEinar GradD   30 mL at 11/26/20 0800   furosemide (LASIX) injection 60 mg  60 mg Intravenous Q8H SmiCandee FurbishD       heparin injection 5,000 Units  5,000 Units Subcutaneous Q8H Icard, Bradley L, DO   5,000 Units at 11/27/20 0557   hydrALAZINE (APRESOLINE) injection 10 mg  10 mg Intravenous Q6H PRN ReeNevada Crane PA-C   10 mg at 11/27/20 1027   insulin aspart (novoLOG) injection 2-6 Units  2-6 Units Subcutaneous Q4H AgaKipp BroodD   2 Units at 11/27/20 0859   insulin detemir (LEVEMIR) injection 12 Units  12 Units Subcutaneous Q12H AgaKipp BroodD   12 Units at 11/26/20 0920   ipratropium-albuterol (DUONEB) 0.5-2.5 (3) MG/3ML nebulizer solution 3 mL  3 mL Nebulization BID AgaKipp BroodD   3 mL at 11/27/20 0739   LORazepam (ATIVAN) tablet 2 mg  2 mg Per Tube Q6H PRN AgaKipp BroodD   2 mg at 11/26/20 0651   MEDLINE mouth rinse  15 mL Mouth Rinse 10 times per day AgaKipp BroodD   15 mL at 11/27/20 0554    midazolam (VERSED) injection 2 mg  2 mg Intravenous Q2H PRN SooChesley MiresD   2 mg at 11/27/20 1019   norepinephrine (LEVOPHED) 4mg29m 250mL51mmix infusion  0-40 mcg/min Intravenous Titrated GeorgEstill Cotta  Stopped at 11/25/20 1802   pantoprazole (PROTONIX) injection 40 mg  40 mg Intravenous Q24H Sood,Chesley Mires  40 mg at 11/26/20 1753   polyethylene glycol (MIRALAX / GLYCOLAX) packet 17 g  17 g Per Tube Daily Sood,Chesley Mires  17 g at 11/25/20 0832 9242dium chloride flush (NS) 0.9 % injection 10-40 mL  10-40 mL Intracatheter Q12H Sood,Chesley Mires  10 mL at 11/26/20 2112   sodium chloride flush (NS) 0.9 % injection 10-40 mL  10-40 mL Intracatheter PRN Sood,Chesley Mires      vancomycin (VANCOREADY) IVPB 1250 mg/250 mL  1,250 mg Intravenous Q24H Icard, Bradley L, DO        Medications Prior to Admission  Medication Sig Dispense Refill Last Dose   ALPRAZolam (XANAX) 1 MG tablet Take 1 mg by mouth 3 (three) times daily as needed for anxiety.   Past Week   escitalopram (LEXAPRO) 20 MG tablet Take 20 mg by mouth daily.   Past Week   metFORMIN (GLUCOPHAGE-XR) 500 MG 24 hr tablet Take 500 mg by mouth in the morning and at bedtime.   Past Week   metoprolol succinate (TOPROL-XL) 50 MG 24 hr tablet Take 50 mg by mouth daily.   Past Week at 0800   omeprazole (PRILOSEC) 20 MG capsule Take 20 mg by mouth daily.   Past Week   spironolactone-hydrochlorothiazide (ALDACTAZIDE)  25-25 MG tablet Take 1 tablet by mouth daily.   Past Week   SUMAtriptan (IMITREX) 50 MG tablet Take 50 mg by mouth daily as needed for migraine.   Past Week   zolpidem (AMBIEN) 10 MG tablet Take 5 mg by mouth at bedtime as needed for sleep.   Past Week   HYDROcodone-acetaminophen (NORCO) 10-325 MG tablet Take 0.5-1 tablets by mouth 3 (three) times daily as needed for moderate pain. (Patient not taking: No sig reported)   Not Taking    Family History  Problem Relation Age of Onset   Cancer Mother      Review of Systems:    (Unable to obtain ROS as patient is requiring sedation and mechanical vent support)     Cardiac Review of Systems: Y or  [    ]= no  Chest Pain [    ]  Resting SOB [   ] Exertional SOB  [  ]  Orthopnea [  ]   Pedal Edema [   ]    Palpitations [  ] Syncope  [  ]   Presyncope [   ]  General Review of Systems: [Y] = yes [  ]=no Constitional: recent weight change [  ]; anorexia [  ]; fatigue [  ]; nausea [  ]; night sweats [  ]; fever [  ]; or chills [  ]                                                               Dental: Last Dentist visit:   Eye : blurred vision [  ]; diplopia [   ]; vision changes [  ];  Amaurosis fugax[  ]; Resp: cough [  ];  wheezing[  ];  hemoptysis[  ]; shortness of breath[  ]; paroxysmal nocturnal dyspnea[  ]; dyspnea on exertion[  ]; or orthopnea[  ];  GI:  gallstones[  ], vomiting[  ];  dysphagia[  ]; melena[  ];  hematochezia [  ]; heartburn[  ];   Hx of  Colonoscopy[  ]; GU: kidney stones [  ]; hematuria[  ];   dysuria [  ];  nocturia[  ];  history of     obstruction [  ]; urinary frequency [  ]             Skin: rash, swelling[  ];, hair loss[  ];  peripheral edema[  ];  or itching[  ]; Musculosketetal: myalgias[  ];  joint swelling[  ];  joint erythema[  ];  joint pain[  ];  back pain[  ];  Heme/Lymph: bruising[  ];  bleeding[  ];  anemia[  ];  Neuro: TIA[  ];  headaches[  ];  stroke[  ];  vertigo[  ];  seizures[  ];   paresthesias[  ];  difficulty walking[  ];  Psych:depression[  ]; anxiety[  ];  Endocrine: diabetes[  ];  thyroid dysfunction[  ];                  Physical Exam: BP (!) (P) 150/54 (BP Location: Right Arm)   Pulse 65   Temp 98.7 F (37.1 C) (Oral)   Resp 16   Ht 5' 4"  (1.626 m)   Wt 80.4 kg  SpO2 94%   BMI 30.42 kg/m    General appearance: Lightly sedated with low-dose Precedex and fentanyl infusions.  She will awaken to voice.  She is noted to be responsive to her son who was in the room at the time of this exam. Head:  Normocephalic, without obvious abnormality, atraumatic Neck: no adenopathy, no carotid bruit, no JVD, and supple, symmetrical, trachea midline Resp: clear to auscultation bilaterally Cardio: Regular rate and rhythm.  Monitor shows normal sinus rhythm.  There is a very soft systolic murmur present, I do not hear a diastolic murmur. GI: Active bowel sounds, no palpable mass.  No apparent tenderness. Extremities: Upper extremities are restrained and in mittens.  SCDs were applied to the lower extremities along with heel protectors.  Lower extremities have excellent perfusion with easily palpable dorsalis pedis pulses. Neurologic: Samantha Cooley will open her eyes to voice but otherwise does not follow any commands for me.  Her nurse reports she was moving her left upper and lower extremity spontaneously earlier today.  Diagnostic Studies & Laboratory data:     Recent Radiology Findings:   MR ANGIO HEAD WO CONTRAST  Result Date: 11/25/2020 CLINICAL DATA:  Neuro deficit, stroke suspected EXAM: MRI HEAD WITHOUT CONTRAST MRA HEAD WITHOUT CONTRAST MRA NECK WITHOUT AND WITH CONTRAST TECHNIQUE: Multiplanar, multi-echo pulse sequences of the brain and surrounding structures were acquired without intravenous contrast. Angiographic images of the Circle of Willis were acquired using MRA technique without intravenous contrast. Angiographic images of the neck were acquired using MRA technique without and with intravenous contrast. Carotid stenosis measurements (when applicable) are obtained utilizing NASCET criteria, using the distal internal carotid diameter as the denominator. CONTRAST:  75m GADAVIST GADOBUTROL 1 MMOL/ML IV SOLN COMPARISON:  And prior MRI, correlation is made with CT head 11/03/2020 FINDINGS: MRI HEAD FINDINGS Brain: Areas of restricted diffusion in the cortex and white matter bilateral frontal, parietal, occipital, posterior temporal lobes, as well as the bilateral cerebellar hemispheres. Additional  punctate focus of restricted diffusion in the left caudate head. Some of this is in a watershed distribution (series 5, image 94), while other areas are more focal. The cortical areas are associated with gyral swelling (series 12, images 15 and 20, for example). Areas of curvilinear susceptibility in the right frontal lobe in the area of infarct (series 15, image 36), likely hemorrhage. No extra-axial fluid collection. No mass, mass effect, or midline shift. Remote left thalamic lacunar infarct. Vascular: See MRA, below. Skull and upper cervical spine: Normal marrow signal. Sinuses/Orbits: Air-fluid levels in the right greater than left maxillary sinus, with trace fluid in the left sphenoid sinus. NG tube seen in the nasal cavity. Partially visualized endotracheal tube in the oral cavity. The orbits are unremarkable. Other: Fluid in the bilateral mastoid air cells. MRA HEAD FINDINGS Anterior circulation: Both internal carotid arteries are patent to the termini, without stenosis or other abnormality; mildly decreased signal in the proximal cavernous carotid is felt to be artifactual. A1 segments patent. Anterior cerebral arteries are patent to their distal aspects. No significant M1 stenosis or occlusion. Normal MCA bifurcations. Distal MCA branches well perfused and symmetric. Posterior circulation: Vertebral arteries patent to the vertebrobasilar junction without stenosis. Focal area of poor flow visualization in the basilar artery (series 5, image 73), which may be artifactual. The basilar is otherwise patent to its distal aspect. Superior cerebral arteries patent bilaterally. PCAs well perfused to their distal aspects without stenosis.Posterior communicating arteries are visualized bilaterally. Anatomic variants: None significant. MRA NECK  FINDINGS Aortic arch: Normal three-vessel arch. No significant stenosis or aneurysm. Right carotid system: Patent, without hemodynamically significant stenosis, occlusion, or  aneurysm. Plaque at the bifurcation. Left carotid system: Patent, without hemodynamically significant stenosis, occlusion, or aneurysm. Plaque at the bifurcation. Vertebral arteries: Limited visualization of the origin of the right vertebral artery secondary to artifact. The vertebral arteries are otherwise patent, without significant stenosis, occlusion, or aneurysm. Other: None IMPRESSION: 1. Multiple areas of restricted diffusion throughout the bilateral cerebral and cerebellar hemispheres, some of which are in a watershed pattern, while others are more likely to be embolic, given different vascular territories. Likely petechial hemorrhage in a right frontal lobe cortical infarct, but no evidence of significant hemorrhagic transformation or midline shift. 2. No large vessel occlusion in the intracranial vasculature. 3. No hemodynamically significant stenosis in the neck. Multiple attempts were made to contact a provider taking care of this patient to convey these results. Electronically Signed   By: Merilyn Baba M.D.   On: 11/25/2020 17:32   MR ANGIO NECK W WO CONTRAST  Result Date: 11/25/2020 CLINICAL DATA:  Neuro deficit, stroke suspected EXAM: MRI HEAD WITHOUT CONTRAST MRA HEAD WITHOUT CONTRAST MRA NECK WITHOUT AND WITH CONTRAST TECHNIQUE: Multiplanar, multi-echo pulse sequences of the brain and surrounding structures were acquired without intravenous contrast. Angiographic images of the Circle of Willis were acquired using MRA technique without intravenous contrast. Angiographic images of the neck were acquired using MRA technique without and with intravenous contrast. Carotid stenosis measurements (when applicable) are obtained utilizing NASCET criteria, using the distal internal carotid diameter as the denominator. CONTRAST:  45m GADAVIST GADOBUTROL 1 MMOL/ML IV SOLN COMPARISON:  And prior MRI, correlation is made with CT head 11/06/2020 FINDINGS: MRI HEAD FINDINGS Brain: Areas of restricted diffusion  in the cortex and white matter bilateral frontal, parietal, occipital, posterior temporal lobes, as well as the bilateral cerebellar hemispheres. Additional punctate focus of restricted diffusion in the left caudate head. Some of this is in a watershed distribution (series 5, image 94), while other areas are more focal. The cortical areas are associated with gyral swelling (series 12, images 15 and 20, for example). Areas of curvilinear susceptibility in the right frontal lobe in the area of infarct (series 15, image 36), likely hemorrhage. No extra-axial fluid collection. No mass, mass effect, or midline shift. Remote left thalamic lacunar infarct. Vascular: See MRA, below. Skull and upper cervical spine: Normal marrow signal. Sinuses/Orbits: Air-fluid levels in the right greater than left maxillary sinus, with trace fluid in the left sphenoid sinus. NG tube seen in the nasal cavity. Partially visualized endotracheal tube in the oral cavity. The orbits are unremarkable. Other: Fluid in the bilateral mastoid air cells. MRA HEAD FINDINGS Anterior circulation: Both internal carotid arteries are patent to the termini, without stenosis or other abnormality; mildly decreased signal in the proximal cavernous carotid is felt to be artifactual. A1 segments patent. Anterior cerebral arteries are patent to their distal aspects. No significant M1 stenosis or occlusion. Normal MCA bifurcations. Distal MCA branches well perfused and symmetric. Posterior circulation: Vertebral arteries patent to the vertebrobasilar junction without stenosis. Focal area of poor flow visualization in the basilar artery (series 5, image 73), which may be artifactual. The basilar is otherwise patent to its distal aspect. Superior cerebral arteries patent bilaterally. PCAs well perfused to their distal aspects without stenosis.Posterior communicating arteries are visualized bilaterally. Anatomic variants: None significant. MRA NECK FINDINGS Aortic  arch: Normal three-vessel arch. No significant stenosis or aneurysm. Right carotid system:  Patent, without hemodynamically significant stenosis, occlusion, or aneurysm. Plaque at the bifurcation. Left carotid system: Patent, without hemodynamically significant stenosis, occlusion, or aneurysm. Plaque at the bifurcation. Vertebral arteries: Limited visualization of the origin of the right vertebral artery secondary to artifact. The vertebral arteries are otherwise patent, without significant stenosis, occlusion, or aneurysm. Other: None IMPRESSION: 1. Multiple areas of restricted diffusion throughout the bilateral cerebral and cerebellar hemispheres, some of which are in a watershed pattern, while others are more likely to be embolic, given different vascular territories. Likely petechial hemorrhage in a right frontal lobe cortical infarct, but no evidence of significant hemorrhagic transformation or midline shift. 2. No large vessel occlusion in the intracranial vasculature. 3. No hemodynamically significant stenosis in the neck. Multiple attempts were made to contact a provider taking care of this patient to convey these results. Electronically Signed   By: Merilyn Baba M.D.   On: 11/25/2020 17:32   MR BRAIN WO CONTRAST  Result Date: 11/25/2020 CLINICAL DATA:  Neuro deficit, stroke suspected EXAM: MRI HEAD WITHOUT CONTRAST MRA HEAD WITHOUT CONTRAST MRA NECK WITHOUT AND WITH CONTRAST TECHNIQUE: Multiplanar, multi-echo pulse sequences of the brain and surrounding structures were acquired without intravenous contrast. Angiographic images of the Circle of Willis were acquired using MRA technique without intravenous contrast. Angiographic images of the neck were acquired using MRA technique without and with intravenous contrast. Carotid stenosis measurements (when applicable) are obtained utilizing NASCET criteria, using the distal internal carotid diameter as the denominator. CONTRAST:  65m GADAVIST GADOBUTROL 1  MMOL/ML IV SOLN COMPARISON:  And prior MRI, correlation is made with CT head 11/10/2020 FINDINGS: MRI HEAD FINDINGS Brain: Areas of restricted diffusion in the cortex and white matter bilateral frontal, parietal, occipital, posterior temporal lobes, as well as the bilateral cerebellar hemispheres. Additional punctate focus of restricted diffusion in the left caudate head. Some of this is in a watershed distribution (series 5, image 94), while other areas are more focal. The cortical areas are associated with gyral swelling (series 12, images 15 and 20, for example). Areas of curvilinear susceptibility in the right frontal lobe in the area of infarct (series 15, image 36), likely hemorrhage. No extra-axial fluid collection. No mass, mass effect, or midline shift. Remote left thalamic lacunar infarct. Vascular: See MRA, below. Skull and upper cervical spine: Normal marrow signal. Sinuses/Orbits: Air-fluid levels in the right greater than left maxillary sinus, with trace fluid in the left sphenoid sinus. NG tube seen in the nasal cavity. Partially visualized endotracheal tube in the oral cavity. The orbits are unremarkable. Other: Fluid in the bilateral mastoid air cells. MRA HEAD FINDINGS Anterior circulation: Both internal carotid arteries are patent to the termini, without stenosis or other abnormality; mildly decreased signal in the proximal cavernous carotid is felt to be artifactual. A1 segments patent. Anterior cerebral arteries are patent to their distal aspects. No significant M1 stenosis or occlusion. Normal MCA bifurcations. Distal MCA branches well perfused and symmetric. Posterior circulation: Vertebral arteries patent to the vertebrobasilar junction without stenosis. Focal area of poor flow visualization in the basilar artery (series 5, image 73), which may be artifactual. The basilar is otherwise patent to its distal aspect. Superior cerebral arteries patent bilaterally. PCAs well perfused to their  distal aspects without stenosis.Posterior communicating arteries are visualized bilaterally. Anatomic variants: None significant. MRA NECK FINDINGS Aortic arch: Normal three-vessel arch. No significant stenosis or aneurysm. Right carotid system: Patent, without hemodynamically significant stenosis, occlusion, or aneurysm. Plaque at the bifurcation. Left carotid system: Patent, without  hemodynamically significant stenosis, occlusion, or aneurysm. Plaque at the bifurcation. Vertebral arteries: Limited visualization of the origin of the right vertebral artery secondary to artifact. The vertebral arteries are otherwise patent, without significant stenosis, occlusion, or aneurysm. Other: None IMPRESSION: 1. Multiple areas of restricted diffusion throughout the bilateral cerebral and cerebellar hemispheres, some of which are in a watershed pattern, while others are more likely to be embolic, given different vascular territories. Likely petechial hemorrhage in a right frontal lobe cortical infarct, but no evidence of significant hemorrhagic transformation or midline shift. 2. No large vessel occlusion in the intracranial vasculature. 3. No hemodynamically significant stenosis in the neck. Multiple attempts were made to contact a provider taking care of this patient to convey these results. Electronically Signed   By: Merilyn Baba M.D.   On: 11/25/2020 17:32   DG Chest Port 1 View  Result Date: 11/26/2020 CLINICAL DATA:  Hypoxia. EXAM: PORTABLE CHEST 1 VIEW COMPARISON:  November 24, 2020. FINDINGS: Stable cardiomediastinal silhouette. Endotracheal tube is in good position. Bibasilar atelectasis or edema is noted with associated pleural effusions. Bony thorax is unremarkable. IMPRESSION: Bibasilar atelectasis or edema is noted with associated pleural effusions. Electronically Signed   By: Marijo Conception M.D.   On: 11/26/2020 15:41   ECHO TEE  Result Date: 11/26/2020    TRANSESOPHOGEAL ECHO REPORT   Patient Name:    Samantha Cooley [facility] Date of Exam: 11/26/2020 Medical Rec #:  [id number]      Height:       64.0 in Accession #:    [id number]     Weight:       171.7 lb Date of Birth:  1960/05/05       BSA:          1.834 m Patient Age:    69 years       BP:           153/68 mmHg Patient Gender: F              HR:           61 bpm. Exam Location:  Inpatient Procedure: Transesophageal Echo, 3D Echo, Color Doppler, Cardiac Doppler and            Saline Contrast Bubble Study Indications:     Stroke  History:         Patient has prior history of Echocardiogram examinations, most                  recent 11/23/2020. Risk Factors:Hypertension and Diabetes.  Sonographer:     Bernadene Person RDCS Referring Phys:  [id number] Nadean Corwin HILTY Diagnosing Phys: Lyman Bishop MD PROCEDURE: After discussion of the risks and benefits of a TEE, an informed consent was obtained from the patient. The transesophogeal probe was passed without difficulty through the esophogus of the patient. Sedation performed by different physician. The patient's vital signs; including heart rate, blood pressure, and oxygen saturation; remained stable throughout the procedure. The patient developed no complications during the procedure. IMPRESSIONS  1. Left ventricular ejection fraction, by estimation, is 55 to 60%. The left ventricle has normal function. There is moderate left ventricular hypertrophy.  2. Right ventricular systolic function is normal. The right ventricular size is normal.  3. No left atrial/left atrial appendage thrombus was detected.  4. The mitral valve is grossly normal. Mild mitral valve regurgitation. No evidence of mitral stenosis.  5. There is a 1.5 x 0.5 cm mobile mass associated with the non-coronary  cusp of the aortic valve with thickening of the remaining leaflets, this is suggestive of vegetation. The aortic valve is tricuspid. Aortic valve regurgitation is severe and eccentric with multiple jets and a wide vena contracta.  6. Agitated saline contrast  bubble study was negative, with no evidence of any interatrial shunt. 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 FINDINGS  Left Ventricle: Left ventricular ejection fraction, by estimation, is 55 to 60%. The left ventricle has normal function. The left ventricular internal cavity size was normal in size. There is moderate left ventricular hypertrophy. Right Ventricle: The right ventricular size is normal. No increase in right ventricular wall thickness. Right ventricular systolic function is normal. Left Atrium: Left atrial size was normal in size. No left atrial/left atrial appendage thrombus was detected. Right Atrium: Right atrial size was normal in size. Pericardium: There is no evidence of pericardial effusion. Mitral Valve: The mitral valve is grossly normal. Mild mitral valve regurgitation. No evidence of mitral valve stenosis. Tricuspid Valve: The tricuspid valve is grossly normal. Tricuspid valve regurgitation is trivial. Aortic Valve: There is a 1.5 x 0.5 cm mobile mass associated with the non-coronary cusp of the aortic valve with thickening of the remaining leaflets, this is suggestive of vegetation. The aortic valve is tricuspid. Aortic valve regurgitation is severe. Pulmonic Valve: The pulmonic valve was grossly normal. Pulmonic valve regurgitation is trivial. Aorta: The aortic root and ascending aorta are structurally normal, with no evidence of dilitation. IAS/Shunts: No atrial level shunt detected by color flow Doppler. Agitated saline contrast was given intravenously to evaluate for intracardiac shunting. Agitated saline contrast bubble study was negative, with no evidence of any interatrial shunt. Lyman Bishop MD Electronically signed by Lyman Bishop MD Signature Date/Time:  11/26/2020/2:41:09 PM    Final      I have independently reviewed the above radiologic studies and discussed with the patient   Recent Lab Findings: Lab Results  Component Value Date   WBC 6.0 11/27/2020   HGB 8.5 (L) 11/27/2020   HCT 26.4 (L) 11/27/2020   PLT 92 (L) 11/27/2020   GLUCOSE 117 (H) 11/27/2020   CHOL 139 11/25/2020   TRIG 109 11/26/2020   HDL 59 11/25/2020   LDLCALC 58 11/25/2020   ALT 24 11/26/2020   AST 21 11/26/2020   NA 135 11/27/2020   K 4.2 11/27/2020   CL 106 11/27/2020   CREATININE 0.98 11/27/2020   BUN 16 11/27/2020   CO2 22 11/27/2020   HGBA1C 6.5 (H) 11/23/2020      Assessment / Plan:      -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  Blood cultures obtained on admission remain negative at 5 days but these were drawn after broad-spectrum empiric antibiotics were initiated.  She is currently reasonably compensated from a cardiac standpoint.  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  She is ventilator dependent and has been unable to meet parameters for weaning of mechanical ventilation on multiple attempts over the past few days. Aortic valve replacement is indicated given severe AI and the large dimension of the aortic valve vegetation. However, surgery is unfortunately risk-prohibitive at this juncture due to her acute bilateral cerebral and cerebellar infarcts.  Should she be  exhibit significant neurologic recovery and recovery in her respiratory status, surgery could be reconsidered at a later date when the cerebral infarcts have had sufficient time to stabilize.  Dr. Kipp Brood will review clinical data and further recommendations will follow.  I  spent 25 minutes seeing the patient face to face and discussing her care with her son.   Antony Odea,  PA-C    11/27/2020 10:38 AM      Agree with above 60 yo female with aortic valve endocarditis and severe AI.  She originally presented after a PEA arrest at home.  She was also noted to have bilateral embolic strokes.  On exam, she follows commands, but has not moved her right upper extremity.  Hemodynamically she has been stable, but currently remains ventilated.  Once extubated, she will need neuro clearance she does not appear to have any mycotic cerebral aneurysms on MRA.  If she remains stable, then she will also need a LHC, and then we can plan for aortic valve replacement.  Vartan Kerins Bary Leriche

## 2020-11-27 NOTE — Progress Notes (Signed)
Pt is agitated and asynchronous with the vent. O2 sats dropped as low as 62%.  RT at the bedside. Pt ventilated with bag valve at 100% FiO2. Pt HR increased to 150's. Elink called and present via video. CCM MD notified. Orders placed. Administered Fentanyl bolus, 2mg  Versed IV, 50mg  Rocuronium, and propofol gtt initiated at 30mcg/kg/min.. Will notify provider if pt does not improved   11/27/20 1959 11/27/20 2005  Vitals  Temp 99.1 F (37.3 C) 99 F (37.2 C)  Temp Source Oral  --   Pulse Rate 82  --   ECG Heart Rate 82  --   Resp 18  --   Oxygen Therapy  SpO2 (!) 68 %  --   Art Line  Arterial Line BP 93/44  --   Arterial Line MAP (mmHg) 62 mmHg  --   MEWS Score  MEWS Temp 0 0  MEWS Systolic 0 0  MEWS Pulse 0 0  MEWS RR 0 0  MEWS LOC 1 1  MEWS Score 1 1  MEWS Score Color 01/27/21

## 2020-11-28 ENCOUNTER — Inpatient Hospital Stay (HOSPITAL_COMMUNITY): Payer: Self-pay

## 2020-11-28 DIAGNOSIS — I679 Cerebrovascular disease, unspecified: Secondary | ICD-10-CM

## 2020-11-28 DIAGNOSIS — I6340 Cerebral infarction due to embolism of unspecified cerebral artery: Secondary | ICD-10-CM

## 2020-11-28 LAB — URINALYSIS, ROUTINE W REFLEX MICROSCOPIC
Bacteria, UA: NONE SEEN
Bilirubin Urine: NEGATIVE
Glucose, UA: NEGATIVE mg/dL
Ketones, ur: NEGATIVE mg/dL
Leukocytes,Ua: NEGATIVE
Nitrite: NEGATIVE
Protein, ur: NEGATIVE mg/dL
Specific Gravity, Urine: 1.019 (ref 1.005–1.030)
pH: 5 (ref 5.0–8.0)

## 2020-11-28 LAB — BILIRUBIN, DIRECT: Bilirubin, Direct: 0.2 mg/dL (ref 0.0–0.2)

## 2020-11-28 LAB — CBC WITH DIFFERENTIAL/PLATELET
Abs Immature Granulocytes: 0.05 10*3/uL (ref 0.00–0.07)
Basophils Absolute: 0 10*3/uL (ref 0.0–0.1)
Basophils Relative: 0 %
Eosinophils Absolute: 0.1 10*3/uL (ref 0.0–0.5)
Eosinophils Relative: 1 %
HCT: 24.8 % — ABNORMAL LOW (ref 36.0–46.0)
Hemoglobin: 8.6 g/dL — ABNORMAL LOW (ref 12.0–15.0)
Immature Granulocytes: 1 %
Lymphocytes Relative: 18 %
Lymphs Abs: 1.3 10*3/uL (ref 0.7–4.0)
MCH: 33.6 pg (ref 26.0–34.0)
MCHC: 34.7 g/dL (ref 30.0–36.0)
MCV: 96.9 fL (ref 80.0–100.0)
Monocytes Absolute: 0.7 10*3/uL (ref 0.1–1.0)
Monocytes Relative: 10 %
Neutro Abs: 5.1 10*3/uL (ref 1.7–7.7)
Neutrophils Relative %: 70 %
Platelets: 141 10*3/uL — ABNORMAL LOW (ref 150–400)
RBC: 2.56 MIL/uL — ABNORMAL LOW (ref 3.87–5.11)
RDW: 13.5 % (ref 11.5–15.5)
WBC: 7.2 10*3/uL (ref 4.0–10.5)
nRBC: 0 % (ref 0.0–0.2)

## 2020-11-28 LAB — TRIGLYCERIDES: Triglycerides: 153 mg/dL — ABNORMAL HIGH (ref <150)

## 2020-11-28 LAB — BASIC METABOLIC PANEL
Anion gap: 10 (ref 5–15)
BUN: 26 mg/dL — ABNORMAL HIGH (ref 8–23)
CO2: 24 mmol/L (ref 22–32)
Calcium: 8.3 mg/dL — ABNORMAL LOW (ref 8.9–10.3)
Chloride: 101 mmol/L (ref 98–111)
Creatinine, Ser: 1.26 mg/dL — ABNORMAL HIGH (ref 0.44–1.00)
GFR, Estimated: 48 mL/min — ABNORMAL LOW (ref >60)
Glucose, Bld: 200 mg/dL — ABNORMAL HIGH (ref 70–99)
Potassium: 3.9 mmol/L (ref 3.5–5.1)
Sodium: 135 mmol/L (ref 135–145)

## 2020-11-28 LAB — COMPREHENSIVE METABOLIC PANEL
ALT: 27 U/L (ref 0–44)
AST: 30 U/L (ref 15–41)
Albumin: 2.2 g/dL — ABNORMAL LOW (ref 3.5–5.0)
Alkaline Phosphatase: 193 U/L — ABNORMAL HIGH (ref 38–126)
Anion gap: 10 (ref 5–15)
BUN: 27 mg/dL — ABNORMAL HIGH (ref 8–23)
CO2: 26 mmol/L (ref 22–32)
Calcium: 8.4 mg/dL — ABNORMAL LOW (ref 8.9–10.3)
Chloride: 101 mmol/L (ref 98–111)
Creatinine, Ser: 1.15 mg/dL — ABNORMAL HIGH (ref 0.44–1.00)
GFR, Estimated: 54 mL/min — ABNORMAL LOW (ref >60)
Glucose, Bld: 159 mg/dL — ABNORMAL HIGH (ref 70–99)
Potassium: 3.9 mmol/L (ref 3.5–5.1)
Sodium: 137 mmol/L (ref 135–145)
Total Bilirubin: 0.8 mg/dL (ref 0.3–1.2)
Total Protein: 5.2 g/dL — ABNORMAL LOW (ref 6.5–8.1)

## 2020-11-28 LAB — MAGNESIUM
Magnesium: 1.8 mg/dL (ref 1.7–2.4)
Magnesium: 2.1 mg/dL (ref 1.7–2.4)

## 2020-11-28 LAB — PHOSPHORUS: Phosphorus: 4.7 mg/dL — ABNORMAL HIGH (ref 2.5–4.6)

## 2020-11-28 LAB — LIPASE, BLOOD: Lipase: 90 U/L — ABNORMAL HIGH (ref 11–51)

## 2020-11-28 LAB — GLUCOSE, CAPILLARY
Glucose-Capillary: 184 mg/dL — ABNORMAL HIGH (ref 70–99)
Glucose-Capillary: 176 mg/dL — ABNORMAL HIGH (ref 70–99)
Glucose-Capillary: 189 mg/dL — ABNORMAL HIGH (ref 70–99)
Glucose-Capillary: 162 mg/dL — ABNORMAL HIGH (ref 70–99)

## 2020-11-28 LAB — AMYLASE: Amylase: 53 U/L (ref 28–100)

## 2020-11-28 LAB — HEMOGLOBIN A1C
Hgb A1c MFr Bld: 6.5 % — ABNORMAL HIGH (ref 4.8–5.6)
Mean Plasma Glucose: 139.85 mg/dL

## 2020-11-28 LAB — ABO/RH
ABO/RH(D): A NEG
PT AG Type: POSITIVE
Weak D: NEGATIVE

## 2020-11-28 IMAGING — DX DG CHEST 1V PORT
1 series · 1 of 1 positions shown · non-contrast
Comparison: Chest x-ray [DATE].

CLINICAL DATA: Post central line placement.

EXAM:
PORTABLE CHEST 1 VIEW

[chest ap]
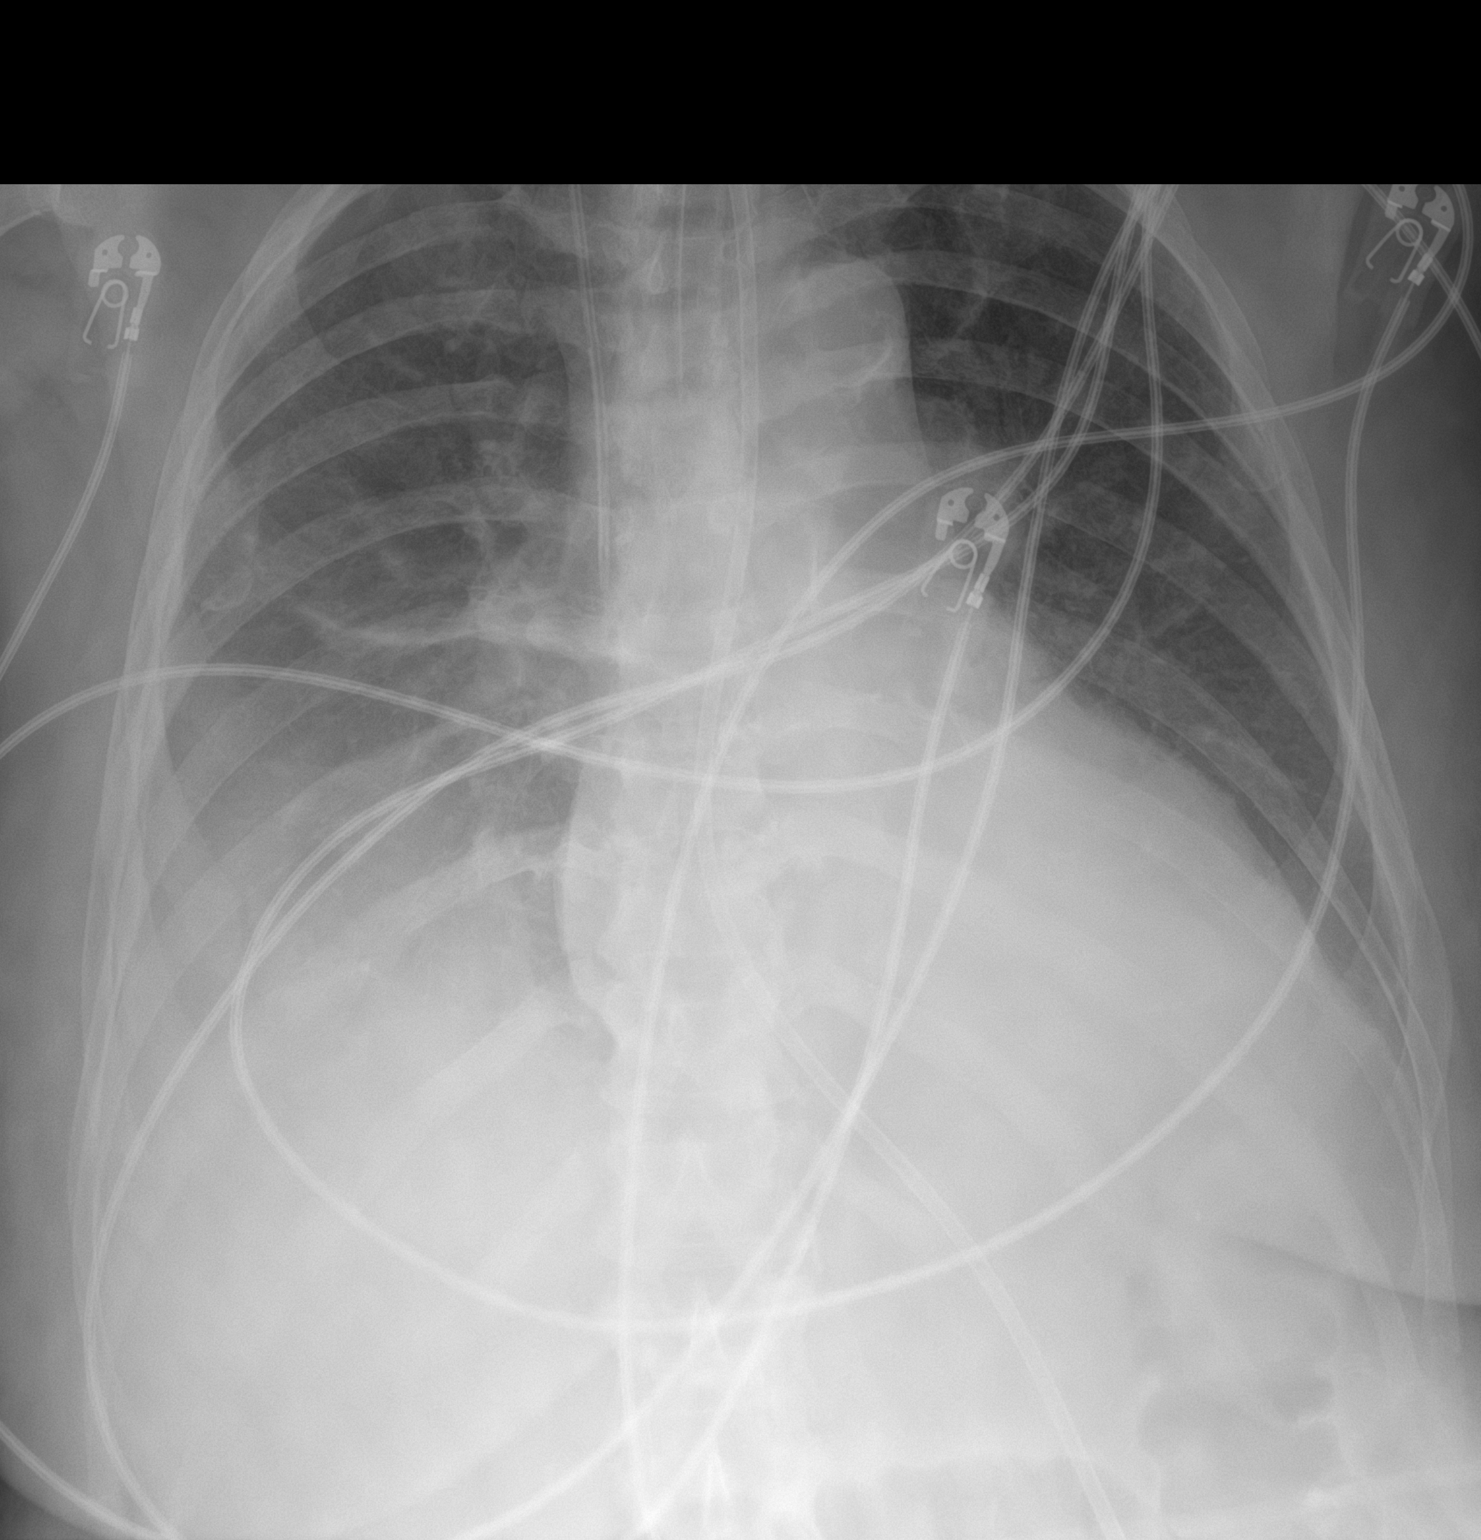

[1 of 1 positions shown; findings below may reference images not displayed]

FINDINGS: Right-sided central venous catheter tip projects over the mid SVC.
Enteric tube extends below the diaphragm. Endotracheal tube tip is 9
mm above the carina.

Small pleural effusions and bibasilar opacities are stable. There is
no pneumothorax. Cardiomediastinal silhouette is unchanged, the
heart is enlarged. No acute fractures are seen.
IMPRESSION: 1. Right-sided central venous catheter tip projects over the mid
SVC.
2. Enteric tube extends below the diaphragm.
3. Endotracheal tube tip 9 mm above carina.
4. Stable bilateral pleural effusions and bibasilar infiltrates.

## 2020-11-28 MED ORDER — MAGNESIUM SULFATE 2 GM/50ML IV SOLN
2.0000 g | Freq: Once | INTRAVENOUS | Status: AC
  Administered 2020-11-28: 2 g via INTRAVENOUS
  Filled 2020-11-28: qty 50

## 2020-11-28 MED ORDER — SODIUM CHLORIDE 0.9 % IV SOLN: INTRAVENOUS | Status: DC | PRN

## 2020-11-28 MED ORDER — POTASSIUM CHLORIDE 20 MEQ PO PACK
40.0000 meq | PACK | Freq: Once | ORAL | Status: AC
  Administered 2020-11-28: 40 meq
  Filled 2020-11-28: qty 2

## 2020-11-28 MED ORDER — CLONAZEPAM 0.5 MG PO TABS
0.5000 mg | ORAL_TABLET | Freq: Two times a day (BID) | ORAL | Status: DC
  Administered 2020-11-28 – 2020-11-29 (×2): 0.5 mg
  Filled 2020-11-28 (×2): qty 1

## 2020-11-28 MED ORDER — PIPERACILLIN-TAZOBACTAM 3.375 G IVPB 30 MIN
3.3750 g | Freq: Four times a day (QID) | INTRAVENOUS | Status: DC
  Administered 2020-11-28 – 2020-11-29 (×4): 3.375 g via INTRAVENOUS
  Filled 2020-11-28 (×6): qty 50

## 2020-11-28 NOTE — Plan of Care (Signed)

## 2020-11-28 NOTE — Progress Notes (Signed)
eLink Physician-Brief Progress Note Patient Name: Samantha Cooley DOB: 1961-02-07 MRN: [id number]   Date of Service  11/28/2020  HPI/Events of Note  Constant liquid stools - Nursing request for Flexiseal.   eICU Interventions  Will place Flexiseal.      Intervention Category Major Interventions: Other:  Lenell Antu 11/28/2020, 11:29 PM

## 2020-11-28 NOTE — Progress Notes (Signed)
Progress Note  Patient Name: JARED CAHN Date of Encounter: 11/28/2020  [facility] Cardiologist: Elouise Munroe, MD   Subjective   Patient awakens on vent. Right hemiparesis. Became agitated last night and tried to get out of bed/bucking vent - with that developed tachycardia up to 150 bpm. This resolved with additional sedation.  Inpatient Medications    Scheduled Meds:  chlorhexidine gluconate (MEDLINE KIT)  15 mL Mouth Rinse BID   Chlorhexidine Gluconate Cloth  6 each Topical Daily   docusate  100 mg Per Tube BID   escitalopram  20 mg Per Tube QHS   feeding supplement (PROSource TF)  45 mL Per Tube TID   free water  30 mL Per Tube Q4H   furosemide  60 mg Intravenous Q8H   heparin  5,000 Units Subcutaneous Q8H   insulin aspart  2-6 Units Subcutaneous Q4H   insulin detemir  5 Units Subcutaneous Q12H   ipratropium-albuterol  3 mL Nebulization BID   LORazepam       mouth rinse  15 mL Mouth Rinse 10 times per day   pantoprazole (PROTONIX) IV  40 mg Intravenous Q24H   polyethylene glycol  17 g Per Tube Daily   sodium chloride flush  10-40 mL Intracatheter Q12H   Continuous Infusions:  sodium chloride 10 mL/hr at 11/27/20 1732   sodium chloride     sodium chloride     acetaminophen Stopped (11/27/20 1331)   cefTRIAXone (ROCEPHIN)  IV Stopped (11/27/20 1452)   dexmedetomidine (PRECEDEX) IV infusion 1.2 mcg/kg/hr (11/28/20 0310)   feeding supplement (VITAL HIGH PROTEIN) 1,000 mL (11/24/20 2039)   fentaNYL infusion INTRAVENOUS 200 mcg/hr (11/28/20 0310)   norepinephrine (LEVOPHED) Adult infusion     propofol (DIPRIVAN) infusion 20 mcg/kg/min (11/28/20 0723)   vancomycin Stopped (11/27/20 1732)   PRN Meds: acetaminophen (TYLENOL) oral liquid 160 mg/5 mL, dextrose, fentaNYL, hydrALAZINE, LORazepam, midazolam, sodium chloride flush   Vital Signs    Vitals:   11/28/20 0300 11/28/20 0334 11/28/20 0500 11/28/20 0730  BP: (!) 120/53 (!) 132/47 (!) 133/54   Pulse: 61  60 (!) 58 (!) 58  Resp: _0 Temp: (!) 100.6 F (38.1 C)     TempSrc: Oral     SpO2: 98% 98% 98% 99%  Weight:      Height:        Intake/Output Summary (Last 24 hours) at 11/28/2020 0751 Last data filed at 11/28/2020 0500 Gross per 24 hour  Intake 1866.98 ml  Output 4350 ml  Net -2483.02 ml    Last 3 Weights 11/27/2020 11/26/2020 11/24/2020  Weight (lbs) 177 lb 4 oz 171 lb 11.8 oz 176 lb 5.9 oz  Weight (kg) 80.4 kg 77.9 kg 80 kg      Telemetry    NSR - Personally Reviewed  ECG    NSR with LBBB - Personally Reviewed  Physical Exam   GEN: No acute distress.  On vent Neck: No JVD Cardiac: RRR, no murmurs, rubs, or gallops.  Respiratory: Clear to auscultation bilaterally. GI: Soft, nontender, distended MS: No edema; No deformity. Neuro:  right hemiparesis Psych: Normal affect   Labs    High Sensitivity Troponin:   Recent Labs  Lab 11/13/2020 1610 11/05/2020 2020 11/23/20 0758 11/24/20 0357  TROPONINIHS 588* 1,684* 9,756* 2,818*      Chemistry Recent Labs  Lab 11/24/20 0357 11/24/20 1645 11/25/20 0341 11/26/20 0323 11/27/20 0340 11/27/20 1508 11/28/20 0511  NA 132*  --  132*  134* 135 137 135  K 4.1  --  3.9 3.6 4.2 4.2 3.9  CL 106  --  104 102 106 102 101  CO2 16*  --  19* _0 GLUCOSE 178*  --  184* 110* 117* 144* 200*  BUN 21  --  _1 26*  CREATININE 1.30*  --  1.12* 1.12* 0.98 1.21* 1.26*  CALCIUM 6.9*  --  7.4* 7.8* 7.9* 8.3* 8.3*  MG 1.6* 1.9  --   --  2.1  --  1.8  PROT 4.3*  --  4.8* 5.1*  --   --   --   ALBUMIN 2.3*  --  2.4* 2.3*  --   --   --   AST 51*  --  31 21  --   --   --   ALT 48*  --  33 24  --   --   --   ALKPHOS 70  --  81 80  --   --   --   BILITOT 0.7  --  0.7 0.8  --   --   --   GFRNONAA 46*  --  56* 56* >60 51* 48*  ANIONGAP 10  --  _2 Lipids  Recent Labs  Lab 11/25/20 1857 11/26/20 0323 11/28/20 0511  CHOL 139  --   --   TRIG 111   < > 153*  HDL 59  --   --   LDLCALC 58  --   --    CHOLHDL 2.4  --   --    < > = values in this interval not displayed.     Hematology Recent Labs  Lab 11/26/20 0323 11/27/20 0340 11/28/20 0511  WBC 6.7 6.0 7.2  RBC 2.79* 2.59* 2.56*  HGB 9.2* 8.5* 8.6*  HCT 28.1* 26.4* 24.8*  MCV 100.7* 101.9* 96.9  MCH 33.0 32.8 33.6  MCHC 32.7 32.2 34.7  RDW 13.8 14.0 13.5  PLT 61* 92* 141*    Thyroid No results for input(s): TSH, FREET4 in the last 168 hours.  BNPNo results for input(s): BNP, PROBNP in the last 168 hours.  DDimer No results for input(s): DDIMER in the last 168 hours.   Radiology    DG CHEST PORT 1 VIEW  Result Date: 11/27/2020 CLINICAL DATA:  Cardiac arrest, hypoxia. EXAM: PORTABLE CHEST 1 VIEW COMPARISON:  November 26, 2020. FINDINGS: Stable cardiomediastinal silhouette. Endotracheal tube is in good position. No pneumothorax is noted. Stable bibasilar atelectasis or edema is noted with associated pleural effusions. Bony thorax is unremarkable. IMPRESSION: Stable bibasilar atelectasis or edema with associated pleural effusions. Electronically Signed   By: Marijo Conception M.D.   On: 11/27/2020 13:16   DG Chest Port 1 View  Result Date: 11/26/2020 CLINICAL DATA:  Hypoxia. EXAM: PORTABLE CHEST 1 VIEW COMPARISON:  November 24, 2020. FINDINGS: Stable cardiomediastinal silhouette. Endotracheal tube is in good position. Bibasilar atelectasis or edema is noted with associated pleural effusions. Bony thorax is unremarkable. IMPRESSION: Bibasilar atelectasis or edema is noted with associated pleural effusions. Electronically Signed   By: Marijo Conception M.D.   On: 11/26/2020 15:41   DG Abd Portable 1V  Result Date: 11/27/2020 CLINICAL DATA:  Feeding tube placement EXAM: PORTABLE ABDOMEN - 1 VIEW COMPARISON:  None. FINDINGS: Feeding tube tip is in the gastric antrum. IMPRESSION: Feeding tube tip is in the gastric antrum. Electronically Signed   By: Elta Guadeloupe  Shogry M.D.   On: 11/27/2020 16:18   ECHO TEE  Result Date: 11/26/2020     TRANSESOPHOGEAL ECHO REPORT   Patient Name:   TIARIA BIBY [facility] Date of Exam: 11/26/2020 Medical Rec #:  [id number]      Height:       64.0 in Accession #:    [id number]     Weight:       171.7 lb Date of Birth:  [birth year]       BSA:          1.834 m Patient Age:    17 years       BP:           153/68 mmHg Patient Gender: F              HR:           61 bpm. Exam Location:  Inpatient Procedure: Transesophageal Echo, 3D Echo, Color Doppler, Cardiac Doppler and            Saline Contrast Bubble Study Indications:     Stroke  History:         Patient has prior history of Echocardiogram examinations, most                  recent 11/23/2020. Risk Factors:Hypertension and Diabetes.  Sonographer:     Bernadene Person RDCS Referring Phys:  [id number] Nadean Corwin HILTY Diagnosing Phys: Lyman Bishop MD PROCEDURE: After discussion of the risks and benefits of a TEE, an informed consent was obtained from the patient. The transesophogeal probe was passed without difficulty through the esophogus of the patient. Sedation performed by different physician. The patient's vital signs; including heart rate, blood pressure, and oxygen saturation; remained stable throughout the procedure. The patient developed no complications during the procedure. IMPRESSIONS  1. Left ventricular ejection fraction, by estimation, is 55 to 60%. The left ventricle has normal function. There is moderate left ventricular hypertrophy.  2. Right ventricular systolic function is normal. The right ventricular size is normal.  3. No left atrial/left atrial appendage thrombus was detected.  4. The mitral valve is grossly normal. Mild mitral valve regurgitation. No evidence of mitral stenosis.  5. There is a 1.5 x 0.5 cm mobile mass associated with the non-coronary cusp of the aortic valve with thickening of the remaining leaflets, this is suggestive of vegetation. The aortic valve is tricuspid. Aortic valve regurgitation is severe and eccentric with multiple jets and a wide  vena contracta.  6. Agitated saline contrast bubble study was negative, with no evidence of any interatrial shunt. 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 FINDINGS  Left Ventricle: Left ventricular ejection fraction, by estimation, is 55 to 60%. The left ventricle has normal function. The left ventricular internal cavity size was normal in size. There is moderate left ventricular hypertrophy. Right Ventricle: The right ventricular size is normal. No increase in right ventricular wall thickness. Right ventricular systolic function is normal. Left Atrium: Left atrial size was normal in size. No left atrial/left atrial appendage thrombus was detected. Right Atrium: Right atrial size was normal in size. Pericardium: There is no evidence of pericardial effusion. Mitral Valve: The mitral valve is grossly normal. Mild mitral valve regurgitation. No evidence of mitral valve stenosis. Tricuspid Valve: The tricuspid valve is grossly normal. Tricuspid valve regurgitation is trivial.  Aortic Valve: There is a 1.5 x 0.5 cm mobile mass associated with the non-coronary cusp of the aortic valve with thickening of the remaining leaflets, this is suggestive of vegetation. The aortic valve is tricuspid. Aortic valve regurgitation is severe. Pulmonic Valve: The pulmonic valve was grossly normal. Pulmonic valve regurgitation is trivial. Aorta: The aortic root and ascending aorta are structurally normal, with no evidence of dilitation. IAS/Shunts: No atrial level shunt detected by color flow Doppler. Agitated saline contrast was given intravenously to evaluate for intracardiac shunting. Agitated saline contrast bubble study was negative, with no evidence of any interatrial shunt. Lyman Bishop MD Electronically signed by  Lyman Bishop MD Signature Date/Time: 11/26/2020/2:41:09 PM    Final     Cardiac Studies   Echo: IMPRESSIONS     1. No mural thrombus - filling defects in the LV cavity noted with  Definity contrast, suspect related to hypertrophied papillary muscle and  subchordal structures. There also appears to be color flow across the  basal septum concerning for a membranous  VSD or possible eccentric AI jet. Left ventricular ejection fraction, by  estimation, is 55 to 60%. The left ventricle has normal function. The left  ventricle has no regional wall motion abnormalities. There is moderate  left ventricular hypertrophy. Left  ventricular diastolic parameters are consistent with Grade I diastolic  dysfunction (impaired relaxation).   2. Right ventricular systolic function is low normal. The right  ventricular size is normal.   3. The mitral valve was not well visualized. No evidence of mitral valve  regurgitation.   4. The aortic valve was not well visualized. There is moderate  calcification of the aortic valve. Aortic valve regurgitation is mild.  Mild to moderate aortic valve sclerosis/calcification is present, without  any evidence of aortic stenosis. Aortic  regurgitation PHT measures 495 msec. Aortic valve mean gradient measures  4.0 mmHg.   Comparison(s): No prior Echocardiogram.   TEE 11/26/20: IMPRESSIONS     1. Left ventricular ejection fraction, by estimation, is 55 to 60%. The  left ventricle has normal function. There is moderate left ventricular  hypertrophy.   2. Right ventricular systolic function is normal. The right ventricular  size is normal.   3. No left atrial/left atrial appendage thrombus was detected.   4. The mitral valve is grossly normal. Mild mitral valve regurgitation.  No evidence of mitral stenosis.   5. There is a 1.5 x 0.5 cm mobile mass associated with the non-coronary  cusp of the aortic valve with thickening of the remaining leaflets, this  is  suggestive of vegetation. The aortic valve is tricuspid. Aortic valve  regurgitation is severe and  eccentric with multiple jets and a wide vena contracta.   6. Agitated saline contrast bubble study was negative, with no evidence  of any interatrial shunt.   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   Patient Profile     60 y.o. female with history of HTN admitted on 11/08/2020 post out of hospital cardiac arrest. Found unresponsive. Treated with CPR and several rounds of epinephrine with ROSC. No arrhythmia noted.  Assessment & Plan    Out of hospital cardiac arrest. Seen initially by Dr Irish Lack. No documented arrhythmia. No ST elevation on Ecg with LBBB. Not felt to warrant emergent cardiac cath. Troponin increased to  9000 c/w arrest. Ecg without serial changes. I don't think this is consistent with ACS. EF normal by Echo. Doubt acute ischemic or arrhythmic cause. At this point with continued vent support.  Acute respiratory failure secondary to #1. Per CCM. Failed vent wean. Required increased sedation last night . Acute embolic/watershed CVA  with right hemiparesis. Appreciate Neuro input.TEE with evidence of AV vegetation. Per Neuro no indication for antithrombotic or antiplatelet therapy from stroke standpoint since related to SBE.  ASA is listed as allergy but unclear what type of reaction in the past.  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 Really don't hear much of a murmur on exam. Blood cultures negative but obtained after receiving Vanc. Will need 6 weeks of IV antibiotics. CT surgery consulted. May eventually need AVR but surgical risk would be high now with acute stroke. Unclear how this is related to her arrest.       For questions or updates, please contact [place] Please consult [email or web address] for contact info under        Signed, Brynlee Pennywell [place], MD  11/28/2020, 7:51 AM

## 2020-11-28 NOTE — Procedures (Signed)
Central Venous Catheter Insertion Procedure Note  Samantha Cooley  [id number]  1960-07-03  Date:11/28/20  Time:9:59 PM   Provider Performing:Davy Westmoreland Lacretia Nicks Mikey Bussing   Procedure: Insertion of Non-tunneled Central Venous [phone]) with US guidance ([id number])   Indication(s) Organ donation candidate at the direction of [employer]  Consent Risks of the procedure as well as the alternatives and risks of each were explained to the patient and/or caregiver.  Consent for the procedure was obtained and is signed in the bedside chart  Anesthesia Topical only with 1% lidocaine   Timeout Verified patient identification, verified procedure, site/side was marked, verified correct patient position, special equipment/implants available, medications/allergies/relevant history reviewed, required imaging and test results available.  Sterile Technique Maximal sterile technique including full sterile barrier drape, hand hygiene, sterile gown, sterile gloves, mask, hair covering, sterile ultrasound probe cover (if used).  Procedure Description Area of catheter insertion was cleaned with chlorhexidine and draped in sterile fashion.  With real-time ultrasound guidance a central venous catheter was placed into the right internal jugular vein. Nonpulsatile blood flow and easy flushing noted in all ports.  The catheter was sutured in place and sterile dressing applied.  Complications/Tolerance None; patient tolerated the procedure well. Chest X-ray is ordered to verify placement for internal jugular or subclavian cannulation.   Chest x-ray is not ordered for femoral cannulation.  EBL Minimal  Specimen(s) None   Samantha Cooley, AGACNP-BC [facility]  See Amion for personal pager PCCM on call pager [phone] until 7pm. Please call Elink 7p-7a. [phone]  11/28/2020 9:59 PM

## 2020-11-28 NOTE — Progress Notes (Signed)
Recruitment maneuver performed on patient per order and [employer].  Recruitments are to be performed Q6 hours. Patient tolerated the recruitment without any complications.

## 2020-11-28 NOTE — Progress Notes (Addendum)
NAME:  Samantha Cooley, MRN:  [id number], DOB:  [birth year], LOS: 6 ADMISSION DATE:  10/30/2020, CONSULTATION DATE:  11/08/2020 REFERRING MD:  [place] - EDP CHIEF COMPLAINT:  Cardiac arrest    History of Present Illness:  60 year old woman who presented to [facility] ED via EMS 9/30 status post PEA arrest.  PMHx significant for HTN, T2DM, anxiety, depression, insomnia.  History obtained in the emergency room. Patient was her usual state of health; she had been on phone with her significant other around 2 PM. Found at 2:50PM unresponsive and pulseless by significant other.  EMS was called and significant other initiated CPR.  EMS arrival noted to be around 2:55PM. Initial rhythm PEA.  ROSC ~15 minutes.   On arrival to the emergency room, patient was intubated, requiring high FiO2. Initially hypertensive, improved with Fentanyl.  Patient would open eyes, stick her tongue out, had equal muscle tone bilaterally and would lift arms spontaneously but could not follow commands with lower extremities.  Preliminary evaluation in the ER: Glucose 372 White blood cell count 18.3 creatinine 1.75 anion gap metabolic acidosis with serum bicarbonate of 11, lactate of greater than 9, troponin 588. Bedside ultrasound was negative for pericardial effusion RV function normal, LV function difficult to assess. Seen by Cardiology, did not think this was consistent with STEMI.  PCCM admitted to ICU for further management post-arrest.  Pertinent Medical History:  HTN, diabetes, anxiety, depression, insomnia.  Significant Hospital Events: Including procedures, antibiotic start and stop dates in addition to other pertinent events   9/30 admitted s/p arrest, intubated in field, seen by cardiology and felt not primary ST elevation event.  Suspected more metabolically driven.  On presentation had severe anion gap metabolic acidosis, hyperglycemia, and encephalopathy but was following some simple commands such as sticking tongue out and  nodding appropriately. Vanc/zosyn started. CT head neck and chest ordered.  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 10/2 Troponin uptrended to 9756, heparin gtt started for ?ACS. 10/3 Cardiology consult for ?ACS, further recs. New RUE weakness/neglect, MRI Brain with multiple areas of stroke (cardioembolic vs. watershed), no large vessel occlusion, Neuro consult. 10/4 TEE per Cardiology, stable neuro exam. Plan for wean, ?extubation post-TEE. 10/5 TEE demonstrating 1.5 x 0.5cm mobile mass on the non-coronary cusp of the AV. ID/TCTS consulted. Prepared for extubation, but upon sitting patient up for attempt, derecruited/desaturated to 70s and became hypertensive with briefly decreased mental status; recovered with bagging via ETT. Extubation aborted.  ID/ TCTS consulted  Interim History / Subjective:   Became agitated overnight, sitting up and trying to get OOB->desaturated requiring more sedation, prn paralytics, propofol added to fentanyl and precedex  Objective:  Blood pressure (!) 147/49, pulse (!) 59, temperature 99.6 F (37.6 C), temperature source Oral, resp. rate 16, height 5\' 4"  (1.626 m), weight 80.4 kg, SpO2 100 %.    Vent Mode: PRVC FiO2 (%):  [40 %-100 %] 50 % Set Rate:  [16 bmp] 16 bmp Vt Set:  [440 mL] 440 mL PEEP:  [8 cmH20] 8 cmH20 Pressure Support:  [10 cmH20] 10 cmH20 Plateau Pressure:  [18 cmH20-19 cmH20] 19 cmH20   Intake/Output Summary (Last 24 hours) at 11/28/2020 0851 Last data filed at 11/28/2020 0500 Gross per 24 hour  Intake 1866.98 ml  Output 4350 ml  Net -2483.02 ml   Filed Weights   11/24/20 0500 11/26/20 0440 11/27/20 0427  Weight: 80 kg 77.9 kg 80.4 kg   Physical Examination: On precedex 1.2, fentanyl 200, propofol 20 General:  critically ill female sedated on MV HEENT: MM pink/moist, ETT with bite block, right nare cortrak, pupils 3/sluggish,  Neuro: opens eyes to verbal, follows intermittent simple commands, no movement seen in RUE CV:  rr, no murmur PULM:  non labored on MV, rhonchi on right, coarse on left, some old bloody secretions, DP 10, plat 18 GI: soft, b+, NT, foley Extremities: warm/dry, no LE edema  Skin: no rashes  4.3L UOP/ 24hrs from diuresis  Remains -2.4L/ net +4.3L Tmax 100.6  Resolved Hospital Problem List:     Assessment & Plan:   PEA cardiac arrest Undifferentiated shock likely distributive following cardiac arrest Endocarditis with AV vegetation, 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 Initially seen by Dr. Eldridge Dace on admission for STEMI r/o; was not felt to be STEMI at that time (troponin 588). Troponin uptrend to 9756 10/2AM, prompting heparin gtt initiation for ?ACS. 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 - Cardiology following, appreciate recs - TEE demonstrating 1.5 x 0.5cm mobile mass on AV - ID consulted for endocarditis, recommend vanc/ceftriaxone at present with duration 6 weeks - TCTS consulted, would not offer/recommend surgical intervention until patient has recovered from acute stroke/PEA arrest and undergone rehabilitation  - remains hemodynamically stable off vasopressor support  Acute hypoxic respiratory failure and aspiration pneumonia requiring mechanical ventilation Prepared for extubation 10/4, but upon sitting patient up for attempt, derecruited/desaturated to 70s and became hypertensive with briefly decreased mental status; recovered with bagging via ETT. 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 Concern with inability to move RUE; able to move BLE and LUE on exam. CT Head 9/30 NAICA on admission. MRI Brain with multiple areas of ischemic stroke, cardioembolic etiology vs. watershed infarct. MRA Head/Neck without large vessel occlusion. - Neuro signed off 10/5 - Stroke prophylaxis; consider antiplatelet therapy, plavix when able - Goal MAP > 65, avoid hypotension - Neuroprotective measures: HOB > 30 degrees, normoglycemia, normothermia, electrolytes WNL - PT/OT/SLP once extubated  T2DM Stress hyperglycemia Beta-hydroxybutyrate negative. History of T2DM, home regimen includes metformin. - CBG Q4H - Basal Levemir BID - SSI  AKI, improving Suspect secondary to volume depletion and organ hypoperfusion. - continue diuresis, stable renal function - Trend BMP / urinary output - Replace electrolytes as indicated, KCL and Mag replete today  - Avoid nephrotoxic agents, ensure adequate renal perfusion   Best Practice: (right click and "Reselect all SmartList Selections" daily)   Diet/type: NPO, TF at goal  DVT prophylaxis: SCD GI prophylaxis: PPI Lines: N/A, still has right femoral trialysis catheter and right aline- will d/c today given PIVx2 Foley:  Yes, and it is still needed Code Status:  full code Last date of multidisciplinary goals of care discussion [Pending today with son and husband]  Critical care time: 30 minutes     Posey Boyer, ACNP [facility] 11/28/2020, 8:51 AM  See Amion for pager If no response to pager, please call PCCM consult  pager After 7:00 pm call Elink     Pulmonary critical care attending  This is a 60 year old woman, post PEA arrest.  Past medical history of hypertension diabetes depression insomnia.  Patient had found pulseless unresponsive.  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 Ultimately we need to recover from this before even consideration for aortic valve intervention. Pending decisions right now by patient's son there is consideration for comfort care   This patient is critically ill with multiple organ system failure; which, requires frequent high complexity decision making, assessment, support, evaluation, and titration of therapies. This was completed through the application of advanced monitoring technologies and extensive interpretation of multiple databases. During this encounter critical care time was devoted to patient care services described in this note for 32 minutes.   , DO [facility] 11/28/2020 1:33 PM

## 2020-11-28 NOTE — Progress Notes (Addendum)
[facility]   Reason for visit: Follow up on probable endocarditis  Interval History: blood cultures remain ngtd, now final.  Husband at bedside.  WBC 7.2.  Tmax 100.6.   Day 3 total antibiotics   Physical Exam: Constitutional:  Vitals:   11/28/20 0930 11/28/20 1000  BP:  (!) 145/61  Pulse: 63 62  Resp: 16 (!) 6  Temp:    SpO2: 94% 95%   patient appears in NAD Eyes: anicteric HENT: intubated Respiratory: respiratory effort on vent Cardiovascular: RRR   Review of Systems: Unable to be assessed due to patient factors  Lab Results  Component Value Date   WBC 7.2 11/28/2020   HGB 8.6 (L) 11/28/2020   HCT 24.8 (L) 11/28/2020   MCV 96.9 11/28/2020   PLT 141 (L) 11/28/2020    Lab Results  Component Value Date   CREATININE 1.26 (H) 11/28/2020   BUN 26 (H) 11/28/2020   NA 135 11/28/2020   K 3.9 11/28/2020   CL 101 11/28/2020   CO2 24 11/28/2020    Lab Results  Component Value Date   ALT 24 11/26/2020   AST 21 11/26/2020   ALKPHOS 80 11/26/2020     Microbiology: Recent Results (from the past 240 hour(s))  Resp Panel by RT-PCR (Flu A&B, Covid) Nasopharyngeal Swab     Status: None   Collection Time: 11/13/2020  4:22 PM   Specimen: Nasopharyngeal Swab; Nasopharyngeal(NP) swabs in vial transport medium  Result Value Ref Range Status   SARS Coronavirus 2 by RT PCR NEGATIVE NEGATIVE Final    Comment: (NOTE) SARS-CoV-2 target nucleic acids are NOT DETECTED.  The SARS-CoV-2 RNA is generally detectable in upper respiratory specimens during the acute phase of infection. The lowest concentration of SARS-CoV-2 viral copies this assay can detect is 138 copies/mL. A negative result does not preclude SARS-Cov-2 infection and should not be used as the sole basis for treatment or other patient management decisions. A negative result may occur with  improper specimen collection/handling, submission of specimen other than nasopharyngeal swab,  presence of viral mutation(s) within the areas targeted by this assay, and inadequate number of viral copies(<138 copies/mL). A negative result must be combined with clinical observations, patient history, and epidemiological information. The expected result is Negative.  Fact Sheet for Patients:  BloggerCourse.com  Fact Sheet for Healthcare Providers:  SeriousBroker.it  This test is no t yet approved or cleared by the [place] FDA and  has been authorized for detection and/or diagnosis of SARS-CoV-2 by FDA under an Emergency Use Authorization (EUA). This EUA will remain  in effect (meaning this test can be used) for the duration of the COVID-19 declaration under Section 564(b)(1) of the Act, 21 U.S.C.section 360bbb-3(b)(1), unless the authorization is terminated  or revoked sooner.       Influenza A by PCR NEGATIVE NEGATIVE Final   Influenza B by PCR NEGATIVE NEGATIVE Final    Comment: (NOTE) The Xpert Xpress SARS-CoV-2/FLU/RSV plus assay is intended as an aid in the diagnosis of influenza from Nasopharyngeal swab specimens and should not be used as a sole basis for treatment. Nasal washings and aspirates are unacceptable for Xpert Xpress SARS-CoV-2/FLU/RSV testing.  Fact Sheet for Patients: BloggerCourse.com  Fact Sheet for Healthcare Providers: SeriousBroker.it  This test is not yet approved or cleared by the [place] FDA and has been authorized for detection and/or diagnosis of SARS-CoV-2 by FDA under an Emergency Use Authorization (EUA). This EUA will remain in effect (  meaning this test can be used) for the duration of the COVID-19 declaration under Section 564(b)(1) of the Act, 21 U.S.C. section 360bbb-3(b)(1), unless the authorization is terminated or revoked.  Performed at [facility], [address] [place], [place] [address]   Culture, blood  (routine x 2)     Status: None   Collection Time: 11/18/2020  7:56 PM   Specimen: BLOOD RIGHT HAND  Result Value Ref Range Status   Specimen Description BLOOD RIGHT HAND  Final   Special Requests AEROBIC BOTTLE ONLY Blood Culture adequate volume  Final   Culture   Final    NO GROWTH 5 DAYS Performed at [facility], [address] [place], [place] [address]    Report Status 11/27/2020 FINAL  Final  Culture, blood (routine x 2)     Status: None   Collection Time: 10/29/2020  8:06 PM   Specimen: BLOOD RIGHT HAND  Result Value Ref Range Status   Specimen Description BLOOD RIGHT HAND  Final   Special Requests AEROBIC BOTTLE ONLY Blood Culture adequate volume  Final   Culture   Final    NO GROWTH 5 DAYS Performed at [facility], [address] [place], [place] [address]    Report Status 11/27/2020 FINAL  Final  MRSA Next Gen by PCR, Nasal     Status: None   Collection Time: 11/26/20 12:18 AM   Specimen: Nasal Mucosa; Nasal Swab  Result Value Ref Range Status   MRSA by PCR Next Gen NOT DETECTED NOT DETECTED Final    Comment: (NOTE) The GeneXpert MRSA Assay (FDA approved for NASAL specimens only), is one component of a comprehensive MRSA colonization surveillance program. It is not intended to diagnose MRSA infection nor to guide or monitor treatment for MRSA infections. Test performance is not FDA approved in patients less than 64 years old. Performed at [facility], [address], [place], [place] [address]   Culture, Respiratory w Gram Stain     Status: None (Preliminary result)   Collection Time: 11/27/20  9:00 AM   Specimen: Tracheal Aspirate; Respiratory  Result Value Ref Range Status   Specimen Description TRACHEAL ASPIRATE  Final   Special Requests NONE  Final   Gram Stain   Final    ABUNDANT WBC PRESENT, PREDOMINANTLY PMN RARE YEAST    Culture   Final    FEW YEAST CULTURE REINCUBATED FOR BETTER GROWTH Performed at [facility], [address], [place], [place] [address]    Report Status PENDING  Incomplete    Impression/Plan:  1. AV vegetation - no positive cultures and was on antibiotics, now stopped due to #3.   2.  CVA - embolic in nature and likely from #1  3.  Palliative care - discussion with family of one way extubation and emphasis on comfort.  Antibiotics stopped  No further excalation in care desired by family.   I will sign off call with questions

## 2020-11-28 NOTE — Progress Notes (Signed)
OT Cancellation Note  Patient Details Name: Samantha Cooley MRN: [id number] DOB: 1960/09/13   Cancelled Treatment:    Reason Eval/Treat Not Completed:  (OT orders discontinued. Signing off.)  Evern Bio 11/28/2020, 11:30 AM Martie Round, OTR/L Acute Rehabilitation Services Pager: [phone] Office: [phone]

## 2020-11-28 NOTE — Progress Notes (Signed)
  Interdisciplinary Goals of Care Family Meeting   Date carried out:: 11/28/2020  Location of the meeting: Conference room  Member's involved: Nurse Practitioner and Family Member or next of kin   Durable Power of Attorney or acting medical decision maker: patients son, Christiane Ha and his stepfather/ pt's husband, Onalee Hua    Discussion: We discussed goals of care for Samantha Cooley .  Follow-up discussion following GOC meeting held 10/5 with Stefano Gaul, PA. Updated family from yesterday.  Started to discuss options of continuing aggressive care, with recs for early trach in hopes to get patient ready to be surgical candidate.  However husband and son quickly stopped discussion and stated that she would never want a tracheostomy nor want the prolonged recovery then surgery it would require and ask for her to be made comfortable.  We discussed comfort care and one way extubation.  There is more family coming to visit patient tomorrow.  Family wish to transition then to comfort.  Husband does not want to stay but son plans on staying with patient after one-way extubation.  Ongoing emotional support offered.  Bedside RN, Rocky Link updated.  Orders changed to reflect discussion.  No escalation of care, labs, etc.   Code status: Full DNR  Disposition: In-patient comfort care / one way extubation 10/7 after remaining family visits  Time spent for the meeting: 20 mins      Posey Boyer, ACNP [facility] 11/28/2020, 11:11 AM  See Amion for pager If no response to pager, please call PCCM consult pager After 7:00 pm call Elink

## 2020-11-28 NOTE — Procedures (Signed)
Arterial Catheter Insertion Procedure Note  OLAMIDE LAHAIE  [id number]  04/23/1960  Date:11/28/20  Time:8:25 PM    Provider Performing: Davina Poke Laelia Angelo    Procedure: Insertion of Arterial Line (87867) without US guidance  Indication(s) Blood pressure monitoring and/or need for frequent ABGs  Consent Risks of the procedure as well as the alternatives and risks of each were explained to the patient and/or caregiver.  Consent for the procedure was obtained and is signed in the bedside chart  Anesthesia None   Time Out Verified patient identification, verified procedure, site/side was marked, verified correct patient position, special equipment/implants available, medications/allergies/relevant history reviewed, required imaging and test results available.   Sterile Technique Maximal sterile technique including full sterile barrier drape, hand hygiene, sterile gown, sterile gloves, mask, hair covering, sterile ultrasound probe cover (if used).   Procedure Description Area of catheter insertion was cleaned with chlorhexidine and draped in sterile fashion. Without real-time ultrasound guidance an arterial catheter was placed into the left radial artery.  Appropriate arterial tracings confirmed on monitor.     Complications/Tolerance None; patient tolerated the procedure well.   EBL Minimal   Specimen(s) None

## 2020-11-28 NOTE — Plan of Care (Signed)

## 2020-11-28 NOTE — Progress Notes (Signed)
SLP Cancellation Note  Patient Details Name: Samantha Cooley MRN: [id number] DOB: 1960/05/24   Cancelled treatment:       Reason Eval/Treat Not Completed: Other (comment) (Pt transitioned to comfort care and SLP orders cancelled by [facility] NP.)  Yvone Neu I. Vear Clock, MS, CCC-SLP Acute Rehabilitation Services Office number [phone] Pager [phone]  [name] 11/28/2020, 1:46 PM

## 2020-11-28 NOTE — Progress Notes (Signed)
PT Cancellation Note  Patient Details Name: Samantha Cooley MRN: [id number] DOB: 15-Feb-1961   Cancelled Treatment:    Reason Eval/Treat Not Completed: Medical issues which prohibited therapy;Patient not medically ready as she remains intubated and sedated this morning. Per discussion with RN, there is discussion of pt transitioning to comfort measures, will follow to see final decision of goals of care.   Vickki Muff, PT, DPT   Acute Rehabilitation Department Pager #: [phone]   Ronnie Derby 11/28/2020, 10:54 AM

## 2020-11-28 NOTE — Progress Notes (Signed)
Nutrition Brief Note  Chart reviewed. Pt now transitioning to comfort care. TF have been discontinued No further nutrition interventions planned at this time.  Please re-consult as needed.   Romelle Starcher MS, RDN, LDN, CNSC Registered Dietitian III Clinical Nutrition RD Pager and On-Call Pager Number Located in [place]

## 2020-11-29 ENCOUNTER — Inpatient Hospital Stay (HOSPITAL_COMMUNITY): Payer: Self-pay

## 2020-11-29 ENCOUNTER — Encounter (HOSPITAL_COMMUNITY): Payer: Self-pay | Admitting: Certified Registered"

## 2020-11-29 ENCOUNTER — Encounter (HOSPITAL_COMMUNITY): Admission: EM | Disposition: E | Payer: Self-pay | Source: Home / Self Care | Attending: Pulmonary Disease

## 2020-11-29 ENCOUNTER — Other Ambulatory Visit (HOSPITAL_COMMUNITY): Payer: Self-pay

## 2020-11-29 LAB — URINALYSIS, ROUTINE W REFLEX MICROSCOPIC
Bilirubin Urine: NEGATIVE
Glucose, UA: NEGATIVE mg/dL
Hgb urine dipstick: NEGATIVE
Ketones, ur: NEGATIVE mg/dL
Leukocytes,Ua: NEGATIVE
Nitrite: NEGATIVE
Protein, ur: NEGATIVE mg/dL
Specific Gravity, Urine: 1.025 (ref 1.005–1.030)
pH: 5 (ref 5.0–8.0)

## 2020-11-29 LAB — BASIC METABOLIC PANEL
Anion gap: 9 (ref 5–15)
BUN: 24 mg/dL — ABNORMAL HIGH (ref 8–23)
CO2: 26 mmol/L (ref 22–32)
Calcium: 8.2 mg/dL — ABNORMAL LOW (ref 8.9–10.3)
Chloride: 102 mmol/L (ref 98–111)
Creatinine, Ser: 1.1 mg/dL — ABNORMAL HIGH (ref 0.44–1.00)
GFR, Estimated: 57 mL/min — ABNORMAL LOW (ref >60)
Glucose, Bld: 175 mg/dL — ABNORMAL HIGH (ref 70–99)
Potassium: 3.9 mmol/L (ref 3.5–5.1)
Sodium: 137 mmol/L (ref 135–145)

## 2020-11-29 LAB — PROTIME-INR
INR: 1 (ref 0.8–1.2)
INR: 1.1 (ref 0.8–1.2)
INR: 1 (ref 0.8–1.2)
Prothrombin Time: 13.6 seconds (ref 11.4–15.2)
Prothrombin Time: 13.8 seconds (ref 11.4–15.2)
Prothrombin Time: 13.4 seconds (ref 11.4–15.2)

## 2020-11-29 LAB — HEPATIC FUNCTION PANEL
ALT: 38 U/L (ref 0–44)
AST: 53 U/L — ABNORMAL HIGH (ref 15–41)
Albumin: 2.2 g/dL — ABNORMAL LOW (ref 3.5–5.0)
Alkaline Phosphatase: 190 U/L — ABNORMAL HIGH (ref 38–126)
Bilirubin, Direct: 0.3 mg/dL — ABNORMAL HIGH (ref 0.0–0.2)
Indirect Bilirubin: 0.5 mg/dL (ref 0.3–0.9)
Total Bilirubin: 0.8 mg/dL (ref 0.3–1.2)
Total Protein: 5.1 g/dL — ABNORMAL LOW (ref 6.5–8.1)

## 2020-11-29 LAB — COMPREHENSIVE METABOLIC PANEL
ALT: 36 U/L (ref 0–44)
ALT: 36 U/L (ref 0–44)
AST: 53 U/L — ABNORMAL HIGH (ref 15–41)
AST: 43 U/L — ABNORMAL HIGH (ref 15–41)
Albumin: 2.2 g/dL — ABNORMAL LOW (ref 3.5–5.0)
Albumin: 2.2 g/dL — ABNORMAL LOW (ref 3.5–5.0)
Alkaline Phosphatase: 191 U/L — ABNORMAL HIGH (ref 38–126)
Alkaline Phosphatase: 187 U/L — ABNORMAL HIGH (ref 38–126)
Anion gap: 9 (ref 5–15)
Anion gap: 10 (ref 5–15)
BUN: 25 mg/dL — ABNORMAL HIGH (ref 8–23)
BUN: 22 mg/dL (ref 8–23)
CO2: 24 mmol/L (ref 22–32)
CO2: 25 mmol/L (ref 22–32)
Calcium: 8.3 mg/dL — ABNORMAL LOW (ref 8.9–10.3)
Calcium: 8.2 mg/dL — ABNORMAL LOW (ref 8.9–10.3)
Chloride: 103 mmol/L (ref 98–111)
Chloride: 101 mmol/L (ref 98–111)
Creatinine, Ser: 1 mg/dL (ref 0.44–1.00)
Creatinine, Ser: 1 mg/dL (ref 0.44–1.00)
GFR, Estimated: >60 mL/min (ref >60)
GFR, Estimated: >60 mL/min (ref >60)
Glucose, Bld: 146 mg/dL — ABNORMAL HIGH (ref 70–99)
Glucose, Bld: 166 mg/dL — ABNORMAL HIGH (ref 70–99)
Potassium: 3.7 mmol/L (ref 3.5–5.1)
Potassium: 4 mmol/L (ref 3.5–5.1)
Sodium: 136 mmol/L (ref 135–145)
Sodium: 136 mmol/L (ref 135–145)
Total Bilirubin: 1.2 mg/dL (ref 0.3–1.2)
Total Bilirubin: 0.9 mg/dL (ref 0.3–1.2)
Total Protein: 5.2 g/dL — ABNORMAL LOW (ref 6.5–8.1)
Total Protein: 5.1 g/dL — ABNORMAL LOW (ref 6.5–8.1)

## 2020-11-29 LAB — POCT I-STAT 7, (LYTES, BLD GAS, ICA,H+H)
Acid-Base Excess: 2 mmol/L (ref 0.0–2.0)
Bicarbonate: 26.4 mmol/L (ref 20.0–28.0)
Calcium, Ion: 1.17 mmol/L (ref 1.15–1.40)
HCT: 25 % — ABNORMAL LOW (ref 36.0–46.0)
Hemoglobin: 8.5 g/dL — ABNORMAL LOW (ref 12.0–15.0)
O2 Saturation: 94 %
Patient temperature: 99.9
Potassium: 3.8 mmol/L (ref 3.5–5.1)
Sodium: 137 mmol/L (ref 135–145)
TCO2: 28 mmol/L (ref 22–32)
pCO2 arterial: 39.5 mmHg (ref 32.0–48.0)
pH, Arterial: 7.436 (ref 7.350–7.450)
pO2, Arterial: 73 mmHg — ABNORMAL LOW (ref 83.0–108.0)

## 2020-11-29 LAB — GLUCOSE, CAPILLARY
Glucose-Capillary: 131 mg/dL — ABNORMAL HIGH (ref 70–99)
Glucose-Capillary: 171 mg/dL — ABNORMAL HIGH (ref 70–99)
Glucose-Capillary: 167 mg/dL — ABNORMAL HIGH (ref 70–99)
Glucose-Capillary: 148 mg/dL — ABNORMAL HIGH (ref 70–99)

## 2020-11-29 LAB — CBC
HCT: 23.5 % — ABNORMAL LOW (ref 36.0–46.0)
HCT: 24.6 % — ABNORMAL LOW (ref 36.0–46.0)
HCT: 25.2 % — ABNORMAL LOW (ref 36.0–46.0)
Hemoglobin: 8 g/dL — ABNORMAL LOW (ref 12.0–15.0)
Hemoglobin: 8.2 g/dL — ABNORMAL LOW (ref 12.0–15.0)
Hemoglobin: 8.3 g/dL — ABNORMAL LOW (ref 12.0–15.0)
MCH: 33.6 pg (ref 26.0–34.0)
MCH: 32.9 pg (ref 26.0–34.0)
MCH: 32.8 pg (ref 26.0–34.0)
MCHC: 34 g/dL (ref 30.0–36.0)
MCHC: 33.3 g/dL (ref 30.0–36.0)
MCHC: 32.9 g/dL (ref 30.0–36.0)
MCV: 98.7 fL (ref 80.0–100.0)
MCV: 98.8 fL (ref 80.0–100.0)
MCV: 99.6 fL (ref 80.0–100.0)
Platelets: 172 10*3/uL (ref 150–400)
Platelets: 181 10*3/uL (ref 150–400)
Platelets: 240 10*3/uL (ref 150–400)
RBC: 2.38 MIL/uL — ABNORMAL LOW (ref 3.87–5.11)
RBC: 2.49 MIL/uL — ABNORMAL LOW (ref 3.87–5.11)
RBC: 2.53 MIL/uL — ABNORMAL LOW (ref 3.87–5.11)
RDW: 13.4 % (ref 11.5–15.5)
RDW: 13.5 % (ref 11.5–15.5)
RDW: 13.6 % (ref 11.5–15.5)
WBC: 6.9 10*3/uL (ref 4.0–10.5)
WBC: 7.1 10*3/uL (ref 4.0–10.5)
WBC: 7.2 10*3/uL (ref 4.0–10.5)
nRBC: 0 % (ref 0.0–0.2)
nRBC: 0 % (ref 0.0–0.2)
nRBC: 0 % (ref 0.0–0.2)

## 2020-11-29 LAB — AMYLASE
Amylase: 59 U/L (ref 28–100)
Amylase: 48 U/L (ref 28–100)

## 2020-11-29 LAB — MAGNESIUM
Magnesium: 2 mg/dL (ref 1.7–2.4)
Magnesium: 2.1 mg/dL (ref 1.7–2.4)

## 2020-11-29 LAB — PHOSPHORUS
Phosphorus: 4.5 mg/dL (ref 2.5–4.6)
Phosphorus: 4.6 mg/dL (ref 2.5–4.6)

## 2020-11-29 LAB — CULTURE, RESPIRATORY W GRAM STAIN

## 2020-11-29 LAB — RESP PANEL BY RT-PCR (FLU A&B, COVID) ARPGX2
Influenza A by PCR: NEGATIVE
Influenza B by PCR: NEGATIVE
SARS Coronavirus 2 by RT PCR: NEGATIVE

## 2020-11-29 LAB — LIPASE, BLOOD
Lipase: 96 U/L — ABNORMAL HIGH (ref 11–51)
Lipase: 77 U/L — ABNORMAL HIGH (ref 11–51)

## 2020-11-29 IMAGING — CT CT CHEST W/O CM
2 of 4 series · 14 of 46 positions shown, 16 images · non-contrast
Comparison: Chest and abdomen from the previous 2 days, CT from
[DATE]

CLINICAL DATA: Organ donor workup

EXAM:
CT CHEST, ABDOMEN AND PELVIS WITHOUT CONTRAST
TECHNIQUE: Multidetector CT imaging of the chest, abdomen and pelvis was
performed following the standard protocol without IV contrast.

[Series 3: cap without · axial · non-contrast · 0.75mm/px · z∈[+793,+1358]mm · 11 of 133 slices shown, 13 images]
[im 10/133  soft-tissue]
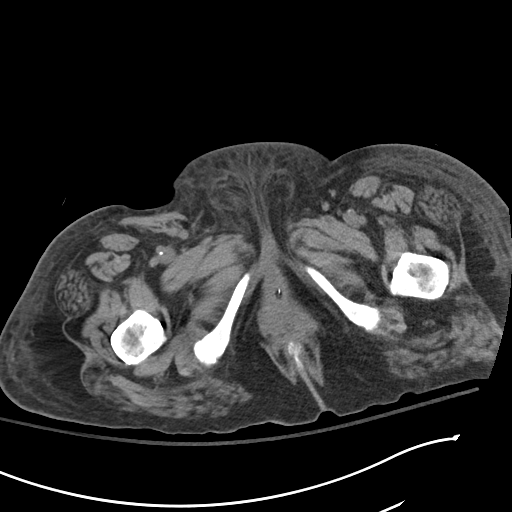
[im 10/133  bone]
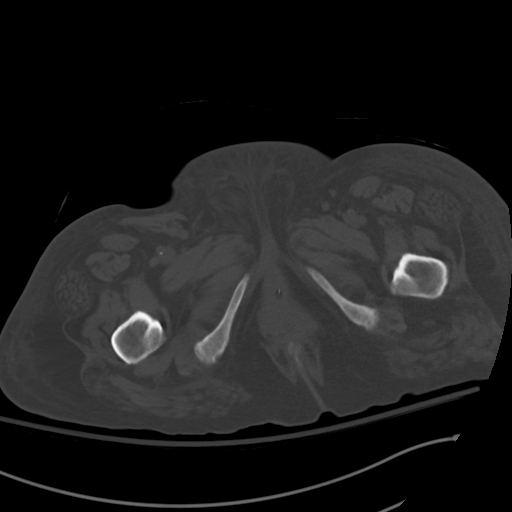
[im 19/133  soft-tissue]
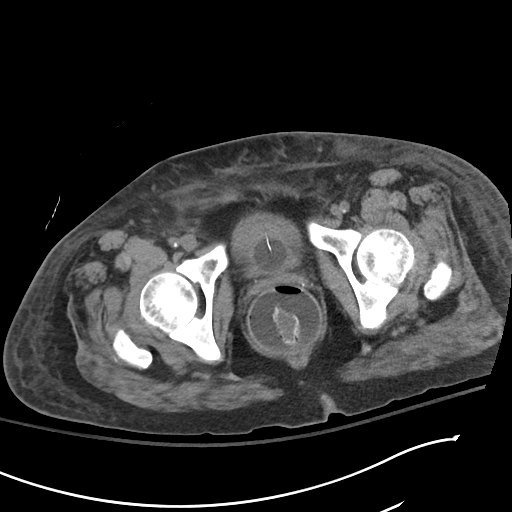
[im 29/133  soft-tissue]
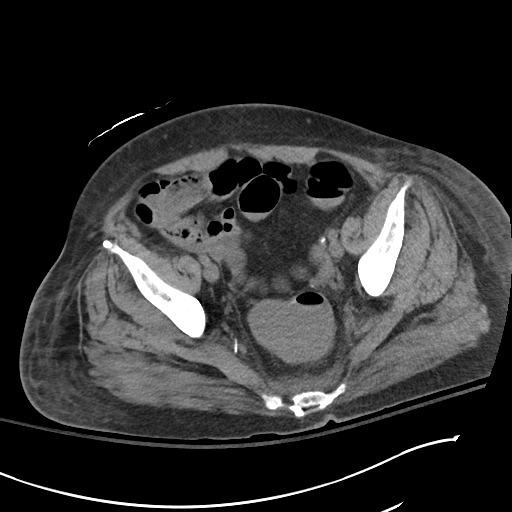
[im 48/133  soft-tissue]
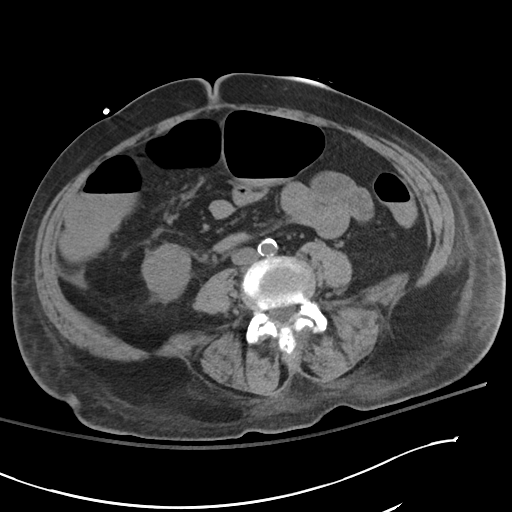
[im 57/133  soft-tissue]
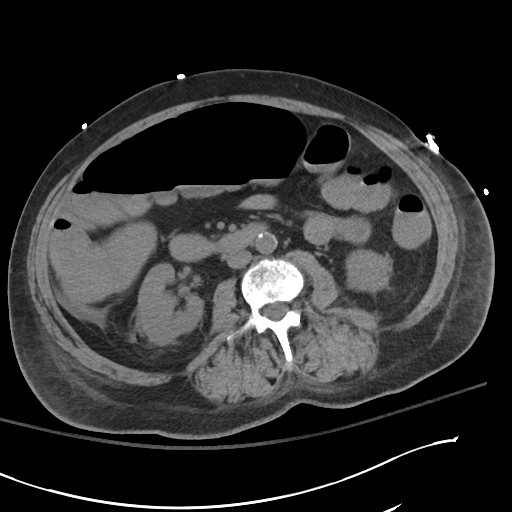
[im 67/133  soft-tissue]
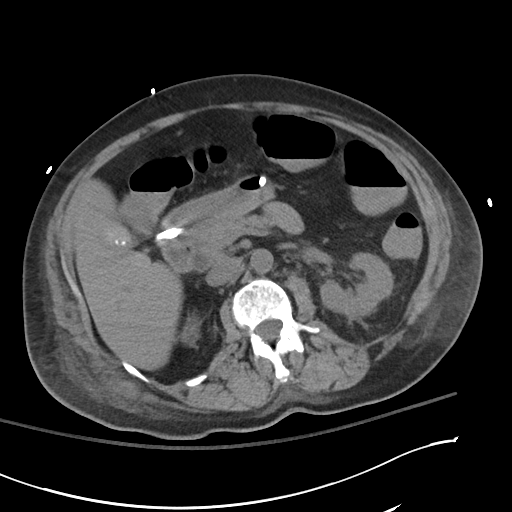
[im 76/133  soft-tissue]
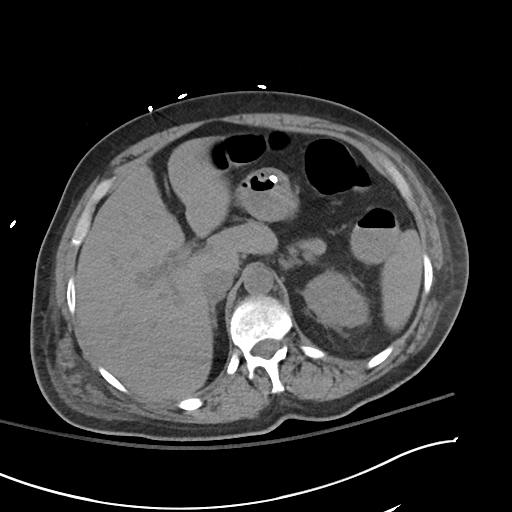
[im 85/133  soft-tissue]
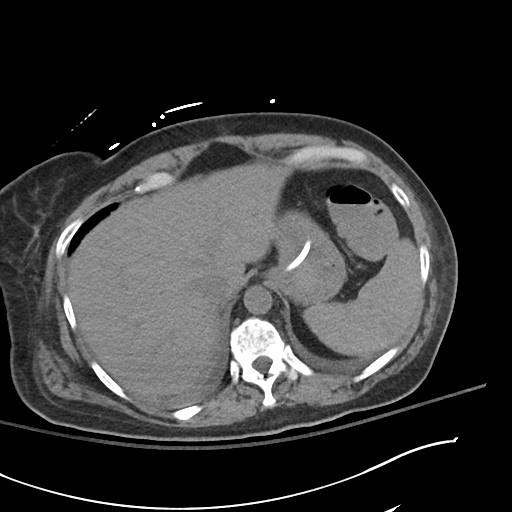
[im 104/133  soft-tissue]
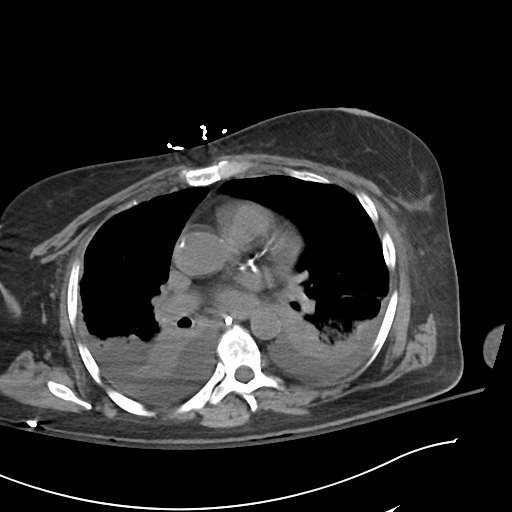
[im 104/133  bone]
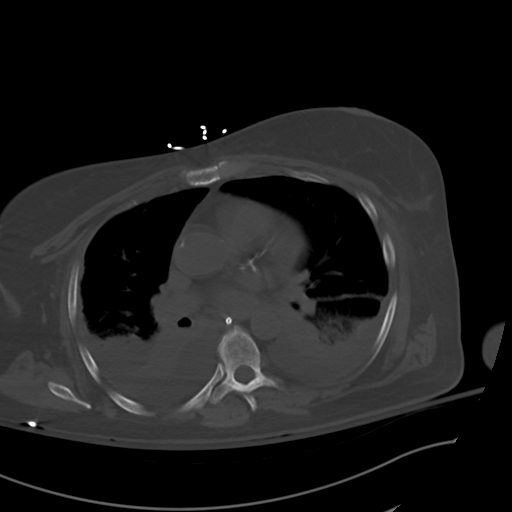
[im 114/133  soft-tissue]
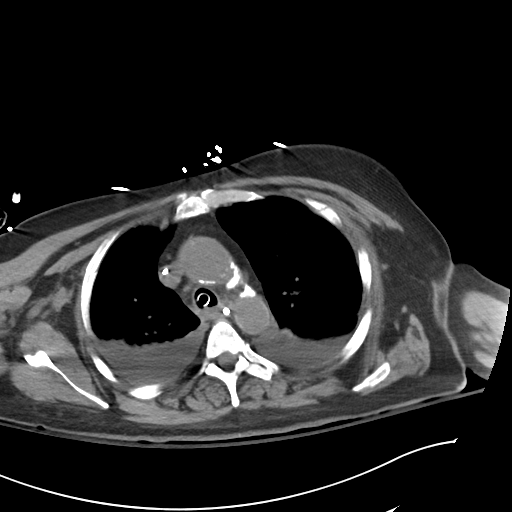
[im 123/133  soft-tissue]
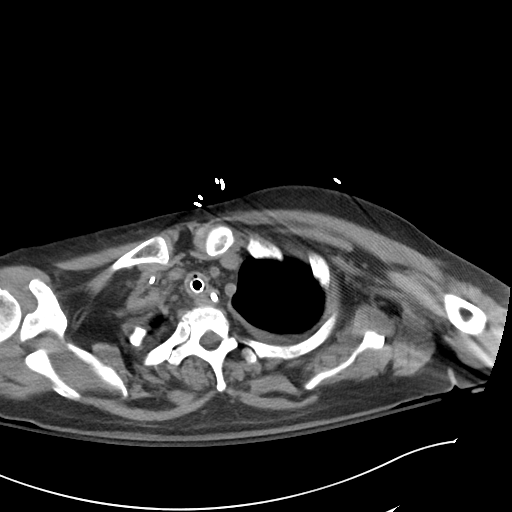

[Series 6: cor · coronal · 0.82mm/px · 3 of 96 slices shown]
[im 32/96  soft-tissue]
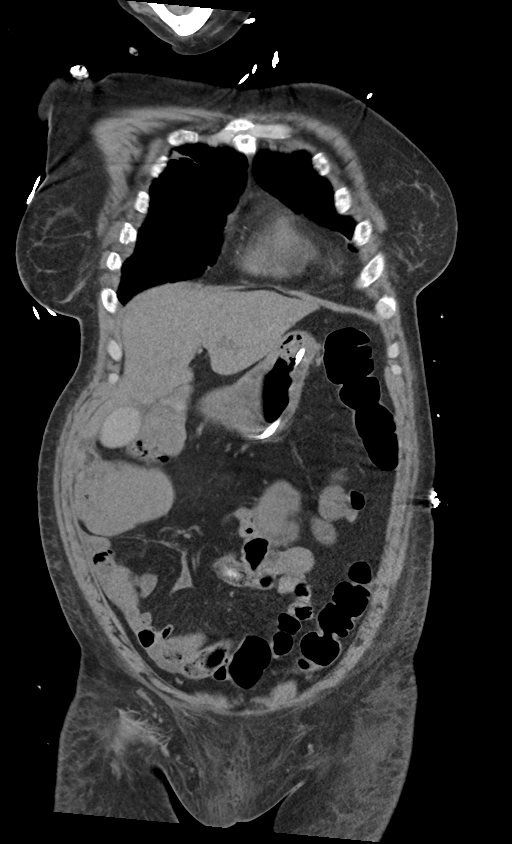
[im 43/96  soft-tissue]
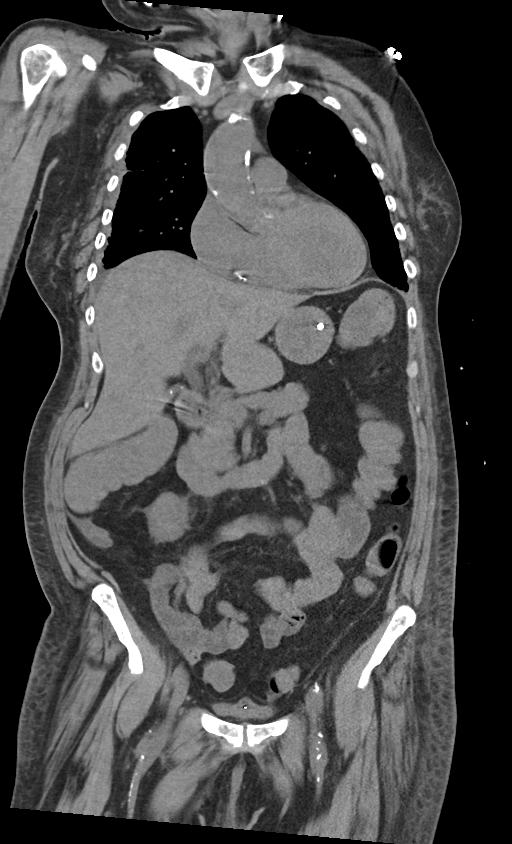
[im 53/96  soft-tissue]
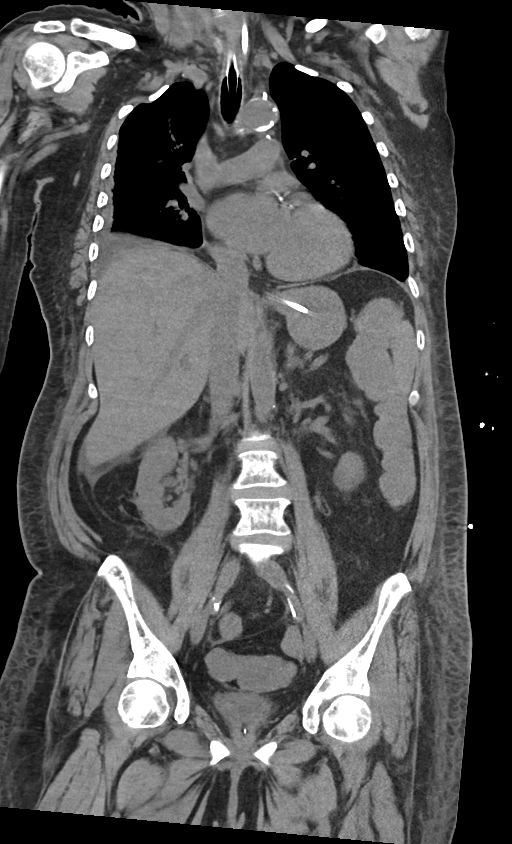

[14 of 46 positions shown; findings below may reference images not displayed]

FINDINGS: CT CHEST FINDINGS

Cardiovascular: Somewhat limited due to lack of IV contrast.
Atherosclerotic calcifications of the thoracic aorta are noted.
Coronary calcifications are seen. Heart is mildly enlarged in size.
Pulmonary artery is not significantly enlarged.

Mediastinum/Nodes: Thoracic inlet is within normal limits.
Endotracheal tube and gastric catheter are noted. No sizable
mediastinal or hilar adenopathy is seen. The esophagus as visualized
is within normal limits.

Lungs/Pleura: Lungs demonstrate bilateral pleural effusions right
greater than left with associated lower lobe consolidation. The
upper lobes are well aerated. No sizable parenchymal nodule is
noted.

Musculoskeletal: Degenerative changes of the thoracic spine are
noted. No rib abnormality is seen.

CT ABDOMEN PELVIS FINDINGS

Hepatobiliary: Liver is within normal limits. Gallbladder is well
distended with evidence of vicarious excretion of contrast material.
A few small gallstones are noted in the dependent portion of the
gallbladder. Multiple punctate densities are noted in the distal
aspect of the common bile duct best visualized on the coronal
imaging consistent with choledocholithiasis.

Pancreas: Pancreas is within normal limits.

Spleen: Normal in size without focal abnormality.

Adrenals/Urinary Tract: Adrenal glands are unremarkable. Kidneys
demonstrate a normal appearance without renal calculi or obstructive
changes. The bladder is decompressed by Foley catheter.

Stomach/Bowel: Rectal tube is noted in place. No obstructive or
inflammatory changes of the colon are seen. The cecum is somewhat
mobile in the mid anterior abdomen. The appendix is not well
visualized and may have been surgically removed. No inflammatory
changes are seen. Small bowel and stomach are within normal limits.
Weighted feeding catheter extends into the proximal duodenum.

Vascular/Lymphatic: Atherosclerotic calcifications of the aorta are
noted. No aneurysmal dilatation is seen. No significant
lymphadenopathy is noted.

Reproductive: Status post hysterectomy. No adnexal masses.

Other: No abdominal wall hernia or abnormality. No abdominopelvic
ascites. Mild changes of anasarca are noted.

Musculoskeletal: No acute or significant osseous findings.
IMPRESSION: CT of the chest: Mild cardiomegaly with coronary calcifications.

Bilateral pleural effusions right greater than left with associated
lower lobe consolidation. This has progressed in the interval from
the prior CT.

CT of the abdomen and pelvis: Cholelithiasis with evidence of
choledocholithiasis. No biliary ductal dilatation is noted.

Mild changes of anasarca.

No other focal abnormality in the abdomen and pelvis is seen.

Aortic Atherosclerosis ([KK]-[KK]).

## 2020-11-29 SURGERY — SURGICAL PROCUREMENT, ORGAN
Anesthesia: General

## 2020-11-29 SURGERY — SURGICAL PROCUREMENT, ORGAN
Anesthesia: Choice

## 2020-11-29 MED ORDER — ACETAMINOPHEN 325 MG PO TABS: 650.0000 mg | ORAL_TABLET | Freq: Four times a day (QID) | ORAL | Status: DC | PRN

## 2020-11-29 MED ORDER — DIPHENHYDRAMINE HCL 50 MG/ML IJ SOLN: 25.0000 mg | INTRAMUSCULAR | Status: DC | PRN

## 2020-11-29 MED ORDER — HEPARIN SODIUM (PORCINE) 1000 UNIT/ML IJ SOLN
8000.0000 [IU] | Freq: Once | INTRAMUSCULAR | Status: AC
  Administered 2020-11-30: 8000 [IU] via INTRAVENOUS
  Filled 2020-11-29: qty 8

## 2020-11-29 MED ORDER — DEXTROSE 5 % IV SOLN: INTRAVENOUS | Status: DC

## 2020-11-29 MED ORDER — MORPHINE SULFATE (PF) 2 MG/ML IV SOLN: 2.0000 mg | INTRAVENOUS | Status: DC | PRN

## 2020-11-29 MED ORDER — GLYCOPYRROLATE 1 MG PO TABS
1.0000 mg | ORAL_TABLET | ORAL | Status: DC | PRN
  Filled 2020-11-29: qty 1

## 2020-11-29 MED ORDER — HYDRALAZINE HCL 20 MG/ML IJ SOLN
10.0000 mg | INTRAMUSCULAR | Status: DC | PRN
  Administered 2020-11-29: 10 mg via INTRAVENOUS
  Filled 2020-11-29: qty 1

## 2020-11-29 MED ORDER — GLYCOPYRROLATE 0.2 MG/ML IJ SOLN
0.2000 mg | INTRAMUSCULAR | Status: DC | PRN
  Administered 2020-11-30: 0.2 mg via INTRAVENOUS
  Filled 2020-11-29: qty 1

## 2020-11-29 MED ORDER — PHENYLEPHRINE HCL-NACL 20-0.9 MG/250ML-% IV SOLN
0.0000 ug/min | INTRAVENOUS | Status: DC
  Administered 2020-11-29: 40 ug/min via INTRAVENOUS
  Filled 2020-11-29: qty 250

## 2020-11-29 MED ORDER — STERILE WATER FOR IRRIGATION IR SOLN
Status: DC | PRN
  Administered 2020-11-29: 1000 mL

## 2020-11-29 MED ORDER — PIPERACILLIN-TAZOBACTAM 3.375 G IVPB
3.3750 g | Freq: Three times a day (TID) | INTRAVENOUS | Status: DC
  Administered 2020-11-29: 3.375 g via INTRAVENOUS
  Filled 2020-11-29: qty 50

## 2020-11-29 MED ORDER — MORPHINE 100MG IN NS 100ML (1MG/ML) PREMIX INFUSION
0.0000 mg/h | INTRAVENOUS | Status: DC
  Administered 2020-11-29: 2 mg/h via INTRAVENOUS
  Administered 2020-11-30: 12 mg/h via INTRAVENOUS
  Filled 2020-11-29 (×2): qty 100

## 2020-11-29 MED ORDER — MORPHINE BOLUS VIA INFUSION
5.0000 mg | INTRAVENOUS | Status: DC | PRN
Start: 2020-11-29 — End: 2020-11-30
  Filled 2020-11-29: qty 5

## 2020-11-29 MED ORDER — ACETAMINOPHEN 650 MG RE SUPP: 650.0000 mg | Freq: Four times a day (QID) | RECTAL | Status: DC | PRN

## 2020-11-29 MED ORDER — MIDAZOLAM BOLUS VIA INFUSION (WITHDRAWAL LIFE SUSTAINING TX)
2.0000 mg | INTRAVENOUS | Status: DC | PRN
  Filled 2020-11-29: qty 2

## 2020-11-29 MED ORDER — MIDAZOLAM HCL 2 MG/2ML IJ SOLN: 1.0000 mg | INTRAMUSCULAR | Status: DC | PRN

## 2020-11-29 MED ORDER — MIDAZOLAM-SODIUM CHLORIDE 100-0.9 MG/100ML-% IV SOLN
0.0000 mg/h | INTRAVENOUS | Status: DC
Start: 2020-11-29 — End: 2020-11-30
  Administered 2020-11-29: 2 mg/h via INTRAVENOUS
  Administered 2020-11-30: 10 mg/h via INTRAVENOUS
  Filled 2020-11-29 (×2): qty 100

## 2020-11-29 MED ORDER — GLYCOPYRROLATE 0.2 MG/ML IJ SOLN: 0.2000 mg | INTRAMUSCULAR | Status: DC | PRN

## 2020-11-29 MED ORDER — POLYVINYL ALCOHOL 1.4 % OP SOLN
1.0000 [drp] | Freq: Four times a day (QID) | OPHTHALMIC | Status: DC | PRN
  Filled 2020-11-29: qty 15

## 2020-11-29 MED ORDER — 0.9 % SODIUM CHLORIDE (POUR BTL) OPTIME
TOPICAL | Status: DC | PRN
  Administered 2020-11-29 (×4): 1000 mL

## 2020-11-29 NOTE — Care Plan (Addendum)
GOALS OF CARE DISCUSSION   The Clinical status was relayed to patient's husband at bedside  in detail.   Updated and notified of patients medical condition.    Explained the course of therapy and the modalities  Signs of brain damage Recommend follow up NEUROLOGY RECS  He was clear that patient would not want tracheostomy/PEG tube and being nursing home.  They would like to proceed with palliative extubation, if she suffers we will proceed with comfort care     Family are satisfied with Plan of action and management. All questions answered    Addendum: Spoke with patient after stopping sedation and patient's son at bedside, they are in agreement with palliative extubation, and not to perform tracheostomy.  Patient remain DNR on comfort care.     Cheri Fowler MD [facility] See Amion for pager If no response to pager, please call [phone] until 7pm After 7pm, Please call E-link [phone]

## 2020-11-29 NOTE — Progress Notes (Signed)
RRT tried recruitment maneuver. Pt did not tolerate and had an episode of bradycardia. RRT immediately stopped the maneuver and Pt HR returned to baseline.

## 2020-11-29 NOTE — Progress Notes (Signed)
Pt ws transported to CT. No complications and vitals are stable. Vent returned to previous parameters.

## 2020-11-29 NOTE — Progress Notes (Signed)
Pt opens eyes to voice, responds with vigorous nods and head shakes to yes/no questions. Squeezes with left hand twice to command but not right.

## 2020-11-29 NOTE — Progress Notes (Signed)
NAME:  Samantha Cooley, MRN:  [id number], DOB:  May 19, 1960, LOS: 7 ADMISSION DATE:  11/12/2020, CONSULTATION DATE:  10/26/2020 REFERRING MD:  [facility] - EDP CHIEF COMPLAINT:  Cardiac arrest    History of Present Illness:  60 year old woman who presented to [facility] ED via EMS 9/30 status post PEA arrest.  PMHx significant for HTN, T2DM, anxiety, depression, insomnia.  History obtained in the emergency room. Patient was her usual state of health; she had been on phone with her significant other around 2 PM. Found at 2:50PM unresponsive and pulseless by significant other.  EMS was called and significant other initiated CPR.  EMS arrival noted to be around 2:55PM. Initial rhythm PEA.  ROSC ~15 minutes.   On arrival to the emergency room, patient was intubated, requiring high FiO2. Initially hypertensive, improved with Fentanyl.  Patient would open eyes, stick her tongue out, had equal muscle tone bilaterally and would lift arms spontaneously but could not follow commands with lower extremities.  Preliminary evaluation in the ER: Glucose 372 White blood cell count 18.3 creatinine 1.75 anion gap metabolic acidosis with serum bicarbonate of 11, lactate of greater than 9, troponin 588. Bedside ultrasound was negative for pericardial effusion RV function normal, LV function difficult to assess. Seen by Cardiology, did not think this was consistent with STEMI.  PCCM admitted to ICU for further management post-arrest.  Pertinent Medical History:  HTN, diabetes, anxiety, depression, insomnia.  Significant Hospital Events: Including procedures, antibiotic start and stop dates in addition to other pertinent events   9/30 admitted s/p arrest, intubated in field, seen by cardiology and felt not primary ST elevation event.  Suspected more metabolically driven.  On presentation had severe anion gap metabolic acidosis, hyperglycemia, and encephalopathy but was following some simple commands such as sticking tongue out and  nodding appropriately. Vanc/zosyn started. CT head neck and chest ordered.  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 10/2 Troponin uptrended to 9756, heparin gtt started for ?ACS. 10/3 Cardiology consult for ?ACS, further recs. New RUE weakness/neglect, MRI Brain with multiple areas of stroke (cardioembolic vs. watershed), no large vessel occlusion, Neuro consult. 10/4 TEE per Cardiology, stable neuro exam. Plan for wean, ?extubation post-TEE. 10/5 TEE demonstrating 1.5 x 0.5cm mobile mass on the non-coronary cusp of the AV. ID/TCTS consulted. Prepared for extubation, but upon sitting patient up for attempt, derecruited/desaturated to 70s and became hypertensive with briefly decreased mental status; recovered with bagging via ETT. Extubation aborted.  ID/ TCTS consulted 10/6 agitated at times, remains on precedex, following simple commands, still RUE hemiplegia, changed to DNR with plans for comfort and one-way extubation 10/7 after family arrive  Interim History / Subjective:   Family decided on comfort 10/6 and one-way extubation planned for today after remaining family visit.  Family did consent to [facility], with ongoing workup for DCD.  R CVL placed last pm.   Remains on propofol 25, fentanyl 200, and precedex added back this morning, off since 0300.   Objective:  Blood pressure (!) 146/53, pulse 78, temperature 99.7 F (37.6 C), temperature source Axillary, resp. rate 16, height 5\' 4"  (1.626 m), weight 80.4 kg, SpO2 98 %.    Vent Mode: PRVC FiO2 (%):  [40 %-50 %] 40 % Set Rate:  [16 bmp] 16 bmp Vt Set:  [440 mL] 440 mL PEEP:  [5 cmH20-8 cmH20] 5 cmH20 Plateau Pressure:  [19 cmH20] 19 cmH20   Intake/Output Summary (Last 24 hours) at 2020/12/19 1030 Last data filed at 2020/12/19  0600 Gross per 24 hour  Intake 1199.31 ml  Output 1273 ml  Net -73.69 ml   Filed Weights   11/24/20 0500 11/26/20 0440 11/27/20 0427  Weight: 80 kg 77.9 kg 80.4 kg   Physical  Examination: General:  Adult female lying in bed in NAD, intubated/ mechanically vented HEENT: MM pink/moist, ETT/ OGT, pupils 3/reactive Neuro: opens eyes to verbal, follows simple commands on LUE/ LLL, RUE flaccid, RLE hemiparesis/ flicker with noxious CV: rr, R IJ cvl, left radial line remains in place PULM:  non labored, CTA GI: soft, bs+, foley  Extremities: warm/dry, no LE edema  Skin: no rashes    2.7L UOP/ 24hrs  Remains -1.1L/ net +3.2L Tmax 100  Resolved Hospital Problem List:   Shock  Assessment & Plan:   PEA cardiac arrest Endocarditis with AV vegetation, 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  Stopped 10/6 given change in GOCs to comfort/ DNR - TCTS consulted, would not offer/recommend surgical intervention until patient has recovered from acute stroke/PEA arrest and undergone rehabilitation   Acute hypoxic respiratory failure and aspiration pneumonia requiring mechanical ventilation - continue full MV support.  Failed SBT 2/2 bradypnea - Given multiple strokes, would likely need trach for recovery/ rehab.  Family voiced 10/6 that she would not want trach.   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 MRI Brain with multiple areas of ischemic stroke, cardioembolic etiology vs. watershed infarct. MRA Head/Neck without large vessel occlusion.  Neuro signed off 10/5 - supportive care.  High risk for ongoing strokes given IE w/ AV vegetation  T2DM Stress hyperglycemia Beta-hydroxybutyrate negative. History of T2DM, home regimen includes metformin.  AKI, improving Suspect secondary to volume depletion and organ hypoperfusion. - continue foley.  UOP/ sCr improving   Best Practice: (right click and "Reselect all SmartList Selections" daily)   Diet/type:  NPO DVT prophylaxis: SCD GI prophylaxis: PPI Lines: R IJ CVC (10/6) Foley:  Yes, and it is still needed Code Status:  DNR Last date of multidisciplinary goals of care discussion: Husband updated.  See Dr. Diannia Ruder separate note.  Family with plans to proceed with one-way extubation and comfort care later today.    Critical care time: 30 minutes    Posey Boyer, ACNP [facility] 12-26-20, 10:30 AM  See Amion for pager If no response to pager, please call PCCM consult pager After 7:00 pm call Elink

## 2020-11-29 NOTE — Progress Notes (Signed)
eLink Physician-Brief Progress Note Patient Name: Samantha Cooley DOB: 04-16-1960 MRN: [id number]   Date of Service  13-Dec-2020  HPI/Events of Note  Hypotension - BP = 90/45 with MAP = 61. Patient has CVL, however, no CVP transducer is hooked up.  eICU Interventions  Plan: Phenylephrine IV infusion. Titrate to MAP >= 65.     Intervention Category Major Interventions: Hypotension - evaluation and management  Raunel Dimartino Eugene 2020/12/13, 8:22 PM

## 2020-11-29 NOTE — Progress Notes (Signed)
100cc fentanyl wasted with Maureen Ralphs , [facility] ICU RN

## 2020-11-29 NOTE — Progress Notes (Signed)
eLink Physician-Brief Progress Note Patient Name: Samantha Cooley DOB: 30-Aug-1960 MRN: [id number]   Date of Service  12-01-2020  HPI/Events of Note  Spoke with son, Audrea Muscat, about the family's desire to proceed with palliative extubation and comfort care and allow his mother to pass with comfort and dignity. This procedure is to be carried out in the OR with the intention of organ donation after death. The family understands this plan and is in agreement with organ donation as well.   eICU Interventions  Will proceed with comfort care via the PCCM Termination of life sustaining measures protocol.     Intervention Category Major Interventions: Other:  Lenell Antu 01-Dec-2020, 10:15 PM

## 2020-11-29 NOTE — Progress Notes (Signed)
Progress Note  Patient Name: Samantha Cooley Date of Encounter: 12/23/2020  [facility] Cardiologist: Elouise Munroe, MD   Subjective   Patient awakens on vent. Right hemiparesis.  Inpatient Medications    Scheduled Meds:  chlorhexidine gluconate (MEDLINE KIT)  15 mL Mouth Rinse BID   Chlorhexidine Gluconate Cloth  6 each Topical Daily   clonazePAM  0.5 mg Per Tube BID   ipratropium-albuterol  3 mL Nebulization BID   mouth rinse  15 mL Mouth Rinse 10 times per day   sodium chloride flush  10-40 mL Intracatheter Q12H   Continuous Infusions:  sodium chloride     sodium chloride     sodium chloride     dexmedetomidine (PRECEDEX) IV infusion Stopped (11/28/2020 0325)   fentaNYL infusion INTRAVENOUS 200 mcg/hr (11/28/2020 0600)   piperacillin-tazobactam 100 mL/hr at 12/18/2020 0600   propofol (DIPRIVAN) infusion 15 mcg/kg/min (12/21/2020 0600)   PRN Meds: Place/Maintain arterial line **AND** sodium chloride, acetaminophen (TYLENOL) oral liquid 160 mg/5 mL, dextrose, fentaNYL, LORazepam, midazolam, sodium chloride flush   Vital Signs    Vitals:   12/04/2020 0500 12/16/2020 0600 12/07/2020 0653 12/10/2020 0744  BP: (!) 112/55 (!) 187/79  (!) 124/57  Pulse: (!) 54 96 63 65  Resp:    16  Temp:    99.7 F (37.6 C)  TempSrc:    Axillary  SpO2: 100% 99% 98% 98%  Weight:      Height:        Intake/Output Summary (Last 24 hours) at 12/13/2020 0808 Last data filed at 12/08/2020 0600 Gross per 24 hour  Intake 1696.03 ml  Output 2873 ml  Net -1176.97 ml    Last 3 Weights 11/27/2020 11/26/2020 11/24/2020  Weight (lbs) 177 lb 4 oz 171 lb 11.8 oz 176 lb 5.9 oz  Weight (kg) 80.4 kg 77.9 kg 80 kg      Telemetry    NSR - Personally Reviewed  ECG    NSR with LBBB - Personally Reviewed  Physical Exam   GEN: No acute distress.  On vent Neck: No JVD Cardiac: RRR, no murmurs, rubs, or gallops.  Respiratory: Clear to auscultation bilaterally. GI: Soft, nontender, distended MS: No  edema; No deformity. Neuro:  right hemiparesis Psych: Normal affect   Labs    High Sensitivity Troponin:   Recent Labs  Lab 11/08/2020 1610 10/28/2020 2020 11/23/20 0758 11/24/20 0357  TROPONINIHS 588* 1,684* 9,756* 2,818*      Chemistry Recent Labs  Lab 11/25/20 0341 11/26/20 0323 11/27/20 0340 11/27/20 1508 11/28/20 0511 11/28/20 2118 12/04/2020 0655  NA 132* 134*   < > 137 135 137  --   K 3.9 3.6   < > 4.2 3.9 3.9  --   CL 104 102   < > 102 101 101  --   CO2 19* 23   < > 22 24 26   --   GLUCOSE 184* 110*   < > 144* 200* 159*  --   BUN 18 14   < > 17 26* 27*  --   CREATININE 1.12* 1.12*   < > 1.21* 1.26* 1.15*  --   CALCIUM 7.4* 7.8*   < > 8.3* 8.3* 8.4*  --   MG  --   --    < >  --  1.8 2.1 2.0  PROT 4.8* 5.1*  --   --   --  5.2*  --   ALBUMIN 2.4* 2.3*  --   --   --  2.2*  --   AST 31 21  --   --   --  30  --   ALT 33 24  --   --   --  27  --   ALKPHOS 81 80  --   --   --  193*  --   BILITOT 0.7 0.8  --   --   --  0.8  --   GFRNONAA 56* 56*   < > 51* 48* 54*  --   ANIONGAP 9 9   < > 13 10 10   --    < > = values in this interval not displayed.     Lipids  Recent Labs  Lab 11/25/20 1857 11/26/20 0323 11/28/20 0511  CHOL 139  --   --   TRIG 111   < > 153*  HDL 59  --   --   LDLCALC 58  --   --   CHOLHDL 2.4  --   --    < > = values in this interval not displayed.     Hematology Recent Labs  Lab 11/28/20 0511 12/15/2020 0143 11/28/2020 0431  WBC 7.2 6.9 7.1  RBC 2.56* 2.38* 2.49*  HGB 8.6* 8.0* 8.2*  HCT 24.8* 23.5* 24.6*  MCV 96.9 98.7 98.8  MCH 33.6 33.6 32.9  MCHC 34.7 34.0 33.3  RDW 13.5 13.4 13.5  PLT 141* 172 181    Thyroid No results for input(s): TSH, FREET4 in the last 168 hours.  BNPNo results for input(s): BNP, PROBNP in the last 168 hours.  DDimer No results for input(s): DDIMER in the last 168 hours.   Radiology    CT ABDOMEN PELVIS WO CONTRAST  Result Date: 12/01/2020 CLINICAL DATA:  Organ donor workup EXAM: CT CHEST, ABDOMEN AND  PELVIS WITHOUT CONTRAST TECHNIQUE: Multidetector CT imaging of the chest, abdomen and pelvis was performed following the standard protocol without IV contrast. COMPARISON:  Chest and abdomen from the previous 2 days, CT from 10/27/2020 FINDINGS: CT CHEST FINDINGS Cardiovascular: Somewhat limited due to lack of IV contrast. Atherosclerotic calcifications of the thoracic aorta are noted. Coronary calcifications are seen. Heart is mildly enlarged in size. Pulmonary artery is not significantly enlarged. Mediastinum/Nodes: Thoracic inlet is within normal limits. Endotracheal tube and gastric catheter are noted. No sizable mediastinal or hilar adenopathy is seen. The esophagus as visualized is within normal limits. Lungs/Pleura: Lungs demonstrate bilateral pleural effusions right greater than left with associated lower lobe consolidation. The upper lobes are well aerated. No sizable parenchymal nodule is noted. Musculoskeletal: Degenerative changes of the thoracic spine are noted. No rib abnormality is seen. CT ABDOMEN PELVIS FINDINGS Hepatobiliary: Liver is within normal limits. Gallbladder is well distended with evidence of vicarious excretion of contrast material. A few small gallstones are noted in the dependent portion of the gallbladder. Multiple punctate densities are noted in the distal aspect of the common bile duct best visualized on the coronal imaging consistent with choledocholithiasis. Pancreas: Pancreas is within normal limits. Spleen: Normal in size without focal abnormality. Adrenals/Urinary Tract: Adrenal glands are unremarkable. Kidneys demonstrate a normal appearance without renal calculi or obstructive changes. The bladder is decompressed by Foley catheter. Stomach/Bowel: Rectal tube is noted in place. No obstructive or inflammatory changes of the colon are seen. The cecum is somewhat mobile in the mid anterior abdomen. The appendix is not well visualized and may have been surgically removed. No  inflammatory changes are seen. Small bowel and stomach are within normal limits. Weighted  feeding catheter extends into the proximal duodenum. Vascular/Lymphatic: Atherosclerotic calcifications of the aorta are noted. No aneurysmal dilatation is seen. No significant lymphadenopathy is noted. Reproductive: Status post hysterectomy. No adnexal masses. Other: No abdominal wall hernia or abnormality. No abdominopelvic ascites. Mild changes of anasarca are noted. Musculoskeletal: No acute or significant osseous findings. IMPRESSION: CT of the chest: Mild cardiomegaly with coronary calcifications. Bilateral pleural effusions right greater than left with associated lower lobe consolidation. This has progressed in the interval from the prior CT. CT of the abdomen and pelvis: Cholelithiasis with evidence of choledocholithiasis. No biliary ductal dilatation is noted. Mild changes of anasarca. No other focal abnormality in the abdomen and pelvis is seen. Aortic Atherosclerosis (ICD10-I70.0). Electronically Signed   By: Inez Catalina M.D.   On: 12/15/2020 01:00   CT CHEST WO CONTRAST  Result Date: 12/05/2020 CLINICAL DATA:  Organ donor workup EXAM: CT CHEST, ABDOMEN AND PELVIS WITHOUT CONTRAST TECHNIQUE: Multidetector CT imaging of the chest, abdomen and pelvis was performed following the standard protocol without IV contrast. COMPARISON:  Chest and abdomen from the previous 2 days, CT from 11/03/2020 FINDINGS: CT CHEST FINDINGS Cardiovascular: Somewhat limited due to lack of IV contrast. Atherosclerotic calcifications of the thoracic aorta are noted. Coronary calcifications are seen. Heart is mildly enlarged in size. Pulmonary artery is not significantly enlarged. Mediastinum/Nodes: Thoracic inlet is within normal limits. Endotracheal tube and gastric catheter are noted. No sizable mediastinal or hilar adenopathy is seen. The esophagus as visualized is within normal limits. Lungs/Pleura: Lungs demonstrate bilateral pleural  effusions right greater than left with associated lower lobe consolidation. The upper lobes are well aerated. No sizable parenchymal nodule is noted. Musculoskeletal: Degenerative changes of the thoracic spine are noted. No rib abnormality is seen. CT ABDOMEN PELVIS FINDINGS Hepatobiliary: Liver is within normal limits. Gallbladder is well distended with evidence of vicarious excretion of contrast material. A few small gallstones are noted in the dependent portion of the gallbladder. Multiple punctate densities are noted in the distal aspect of the common bile duct best visualized on the coronal imaging consistent with choledocholithiasis. Pancreas: Pancreas is within normal limits. Spleen: Normal in size without focal abnormality. Adrenals/Urinary Tract: Adrenal glands are unremarkable. Kidneys demonstrate a normal appearance without renal calculi or obstructive changes. The bladder is decompressed by Foley catheter. Stomach/Bowel: Rectal tube is noted in place. No obstructive or inflammatory changes of the colon are seen. The cecum is somewhat mobile in the mid anterior abdomen. The appendix is not well visualized and may have been surgically removed. No inflammatory changes are seen. Small bowel and stomach are within normal limits. Weighted feeding catheter extends into the proximal duodenum. Vascular/Lymphatic: Atherosclerotic calcifications of the aorta are noted. No aneurysmal dilatation is seen. No significant lymphadenopathy is noted. Reproductive: Status post hysterectomy. No adnexal masses. Other: No abdominal wall hernia or abnormality. No abdominopelvic ascites. Mild changes of anasarca are noted. Musculoskeletal: No acute or significant osseous findings. IMPRESSION: CT of the chest: Mild cardiomegaly with coronary calcifications. Bilateral pleural effusions right greater than left with associated lower lobe consolidation. This has progressed in the interval from the prior CT. CT of the abdomen and  pelvis: Cholelithiasis with evidence of choledocholithiasis. No biliary ductal dilatation is noted. Mild changes of anasarca. No other focal abnormality in the abdomen and pelvis is seen. Aortic Atherosclerosis (ICD10-I70.0). Electronically Signed   By: Inez Catalina M.D.   On: 12/22/2020 01:00   DG Chest Port 1 View  Result Date: 11/28/2020 CLINICAL DATA:  Post central line placement. EXAM: PORTABLE CHEST 1 VIEW COMPARISON:  Chest x-ray 11/27/2020. FINDINGS: Right-sided central venous catheter tip projects over the mid SVC. Enteric tube extends below the diaphragm. Endotracheal tube tip is 9 mm above the carina. Small pleural effusions and bibasilar opacities are stable. There is no pneumothorax. Cardiomediastinal silhouette is unchanged, the heart is enlarged. No acute fractures are seen. IMPRESSION: 1. Right-sided central venous catheter tip projects over the mid SVC. 2. Enteric tube extends below the diaphragm. 3. Endotracheal tube tip 9 mm above carina. 4. Stable bilateral pleural effusions and bibasilar infiltrates. Electronically Signed   By: Ronney Asters M.D.   On: 11/28/2020 22:13   DG CHEST PORT 1 VIEW  Result Date: 11/27/2020 CLINICAL DATA:  Cardiac arrest, hypoxia. EXAM: PORTABLE CHEST 1 VIEW COMPARISON:  November 26, 2020. FINDINGS: Stable cardiomediastinal silhouette. Endotracheal tube is in good position. No pneumothorax is noted. Stable bibasilar atelectasis or edema is noted with associated pleural effusions. Bony thorax is unremarkable. IMPRESSION: Stable bibasilar atelectasis or edema with associated pleural effusions. Electronically Signed   By: Marijo Conception M.D.   On: 11/27/2020 13:16   DG Abd Portable 1V  Result Date: 11/27/2020 CLINICAL DATA:  Feeding tube placement EXAM: PORTABLE ABDOMEN - 1 VIEW COMPARISON:  None. FINDINGS: Feeding tube tip is in the gastric antrum. IMPRESSION: Feeding tube tip is in the gastric antrum. Electronically Signed   By: Nelson Chimes M.D.   On:  11/27/2020 16:18    Cardiac Studies   Echo: IMPRESSIONS     1. No mural thrombus - filling defects in the LV cavity noted with  Definity contrast, suspect related to hypertrophied papillary muscle and  subchordal structures. There also appears to be color flow across the  basal septum concerning for a membranous  VSD or possible eccentric AI jet. Left ventricular ejection fraction, by  estimation, is 55 to 60%. The left ventricle has normal function. The left  ventricle has no regional wall motion abnormalities. There is moderate  left ventricular hypertrophy. Left  ventricular diastolic parameters are consistent with Grade I diastolic  dysfunction (impaired relaxation).   2. Right ventricular systolic function is low normal. The right  ventricular size is normal.   3. The mitral valve was not well visualized. No evidence of mitral valve  regurgitation.   4. The aortic valve was not well visualized. There is moderate  calcification of the aortic valve. Aortic valve regurgitation is mild.  Mild to moderate aortic valve sclerosis/calcification is present, without  any evidence of aortic stenosis. Aortic  regurgitation PHT measures 495 msec. Aortic valve mean gradient measures  4.0 mmHg.   Comparison(s): No prior Echocardiogram.   TEE 11/26/20: IMPRESSIONS     1. Left ventricular ejection fraction, by estimation, is 55 to 60%. The  left ventricle has normal function. There is moderate left ventricular  hypertrophy.   2. Right ventricular systolic function is normal. The right ventricular  size is normal.   3. No left atrial/left atrial appendage thrombus was detected.   4. The mitral valve is grossly normal. Mild mitral valve regurgitation.  No evidence of mitral stenosis.   5. There is a 1.5 x 0.5 cm mobile mass associated with the non-coronary  cusp of the aortic valve with thickening of the remaining leaflets, this  is suggestive of vegetation. The aortic valve is tricuspid.  Aortic valve  regurgitation is severe and  eccentric with multiple jets and a wide vena contracta.   6. Agitated  saline contrast bubble study was negative, with no evidence  of any interatrial shunt.   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   Patient Profile     60 y.o. female with history of HTN admitted on 10/27/2020 post out of hospital cardiac arrest. Found unresponsive. Treated with CPR and several rounds of epinephrine with ROSC. No arrhythmia noted.  Assessment & Plan    Out of hospital cardiac arrest. Seen initially by Dr Irish Lack. No documented arrhythmia. No ST elevation on Ecg with LBBB. Not felt to warrant emergent cardiac cath. Troponin increased to 9000 c/w arrest. Ecg without serial changes. I don't think this is consistent with ACS. EF normal by Echo. Doubt acute ischemic or arrhythmic cause. At this point with continued vent support.  Acute respiratory failure secondary to #1. Per CCM. Failed vent wean. No plans for trach due to family wishes Acute embolic/watershed CVA  with right hemiparesis. Appreciate Neuro input.TEE with evidence of AV vegetation. Per Neuro no indication for antithrombotic or antiplatelet therapy from stroke standpoint since related to SBE.  ASA is listed as allergy but unclear what type of reaction in the past.  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 Really don't hear much of a murmur on exam. Blood cultures negative but obtained after receiving Vanc.   Per family discussion moving to comfort care and extubation. Antibiotics held. Will sign off. Please let us know if we can be of assistance.  [facility] will sign off.   Medication Recommendations:  per primary team Other  recommendations (labs, testing, etc):  none Follow up as an outpatient:  none      For questions or updates, please contact [facility] Please consult [email or web address] for contact info under        Signed, Anum Palecek [place], MD  12/02/2020, 8:08 AM

## 2020-11-30 LAB — URINE CULTURE: Culture: NO GROWTH

## 2020-12-03 LAB — CULTURE, BLOOD (ROUTINE X 2)
Culture: NO GROWTH
Culture: NO GROWTH
Special Requests: ADEQUATE
Special Requests: ADEQUATE

## 2020-12-24 NOTE — Procedures (Signed)
Extubation Procedure Note  Patient Details:   Name: Samantha Cooley DOB: Jun 23, 1960 MRN: [id number]   Airway Documentation:    Vent end date: 11/29/2020 Vent end time: 0017   Evaluation  O2 sats: stable throughout Complications: No apparent complications Patient did tolerate procedure well. Bilateral Breath Sounds: Rhonchi   No  Pt was taken to OR as a donor. Pt's ICU bed was wheeled into room 5, RRT extubated Pt at 0017.  Davina Poke Jeronica Stlouis 12/09/2020, 12:25 AM

## 2020-12-24 NOTE — Death Summary Note (Signed)
DEATH SUMMARY   Patient Details  Name: Samantha Cooley MRN: [id number] DOB: [birth year]  Admission/Discharge Information   Admit Date:  12/13/20  Date of Death: Date of Death: 12-21-20  Time of Death: Time of Death: 0627  Length of Stay: 8  Referring Physician: Richardean Chimera, MD   Reason(s) for Hospitalization  S/p PEA cardiac arrest Aortic valve endocarditis Acute hypoxic respiratory failure Aspiration pneumonia Multiple acute strokes Acute hypoxic/ischemic encephalopathy Acute kidney injury  Diagnoses  Preliminary cause of death:   Comfort care per patient wishes due to multiple acute strokes and hypoxic/ischemic encephalopathy Secondary Diagnoses (including complications and co-morbidities):  Active Problems:   Cardiac arrest (HCC)   Shock (HCC)   Cerebral embolism with cerebral infarction   Brief Hospital Course (including significant findings, care, treatment, and services provided and events leading to death)  Samantha Cooley is a 60 y.o. year old female who presented to [facility] ED via EMS 12-14-2022 status post PEA arrest.  PMHx significant for HTN, T2DM, anxiety, depression, insomnia.   History obtained in the emergency room. Patient was her usual state of health; she had been on phone with her significant other around 2 PM. Found at 2:50PM unresponsive and pulseless by significant other.  EMS was called and significant other initiated CPR.  EMS arrival noted to be around 2:55PM. Initial rhythm PEA.  ROSC ~15 minutes.    On arrival to the emergency room, patient was intubated, requiring high FiO2. Initially hypertensive, improved with Fentanyl.  Patient would open eyes, stick her tongue out, had equal muscle tone bilaterally and would lift arms spontaneously but could not follow commands with lower extremities.   Preliminary evaluation in the ER: Glucose 372 White blood cell count 18.3 creatinine 1.75 anion gap metabolic acidosis with serum bicarbonate of 11, lactate of greater than  9, troponin 588. Bedside ultrasound was negative for pericardial effusion RV function normal, LV function difficult to assess. Seen by Cardiology, did not think this was consistent with STEMI.  Echocardiogram was done which showed normal EF, patient was noted to have vegetation on aortic valve, cultures were negative.  MRI brain was done which showed bilateral multiple strokes.  Patient remain intubated, she was started on antibiotic for aspiration pneumonia, she developed acute kidney injury which improved over time, she was given multiple spontaneous breathing trials, she failed.  Patient did not wanted tracheostomy and she had clear wishes that she would like to be independent, did not wanted to be nursing home on trach and PEG.  After discussion with family per patient wishes family decided to proceed with palliative extubation, if she fails then proceed with comfort care.  Patient was palliatively extubated on 12/22/22, she immediately started struggling, she was started on IV fentanyl and was given Versed infusion to keep her calm and comfortable, she passed on December 22, 2022 at 6:27 AM and was declared dead.    Pertinent Labs and Studies  Significant Diagnostic Studies CT ABDOMEN PELVIS WO CONTRAST  Result Date: Dec 20, 2020 CLINICAL DATA:  Organ donor workup EXAM: CT CHEST, ABDOMEN AND PELVIS WITHOUT CONTRAST TECHNIQUE: Multidetector CT imaging of the chest, abdomen and pelvis was performed following the standard protocol without IV contrast. COMPARISON:  Chest and abdomen from the previous 2 days, CT from 12-13-2020 FINDINGS: CT CHEST FINDINGS Cardiovascular: Somewhat limited due to lack of IV contrast. Atherosclerotic calcifications of the thoracic aorta are noted. Coronary calcifications are seen. Heart is mildly enlarged in size. Pulmonary artery is not significantly enlarged. Mediastinum/Nodes: Thoracic inlet is  within normal limits. Endotracheal tube and gastric catheter are noted. No sizable mediastinal or  hilar adenopathy is seen. The esophagus as visualized is within normal limits. Lungs/Pleura: Lungs demonstrate bilateral pleural effusions right greater than left with associated lower lobe consolidation. The upper lobes are well aerated. No sizable parenchymal nodule is noted. Musculoskeletal: Degenerative changes of the thoracic spine are noted. No rib abnormality is seen. CT ABDOMEN PELVIS FINDINGS Hepatobiliary: Liver is within normal limits. Gallbladder is well distended with evidence of vicarious excretion of contrast material. A few small gallstones are noted in the dependent portion of the gallbladder. Multiple punctate densities are noted in the distal aspect of the common bile duct best visualized on the coronal imaging consistent with choledocholithiasis. Pancreas: Pancreas is within normal limits. Spleen: Normal in size without focal abnormality. Adrenals/Urinary Tract: Adrenal glands are unremarkable. Kidneys demonstrate a normal appearance without renal calculi or obstructive changes. The bladder is decompressed by Foley catheter. Stomach/Bowel: Rectal tube is noted in place. No obstructive or inflammatory changes of the colon are seen. The cecum is somewhat mobile in the mid anterior abdomen. The appendix is not well visualized and may have been surgically removed. No inflammatory changes are seen. Small bowel and stomach are within normal limits. Weighted feeding catheter extends into the proximal duodenum. Vascular/Lymphatic: Atherosclerotic calcifications of the aorta are noted. No aneurysmal dilatation is seen. No significant lymphadenopathy is noted. Reproductive: Status post hysterectomy. No adnexal masses. Other: No abdominal wall hernia or abnormality. No abdominopelvic ascites. Mild changes of anasarca are noted. Musculoskeletal: No acute or significant osseous findings. IMPRESSION: CT of the chest: Mild cardiomegaly with coronary calcifications. Bilateral pleural effusions right greater than  left with associated lower lobe consolidation. This has progressed in the interval from the prior CT. CT of the abdomen and pelvis: Cholelithiasis with evidence of choledocholithiasis. No biliary ductal dilatation is noted. Mild changes of anasarca. No other focal abnormality in the abdomen and pelvis is seen. Aortic Atherosclerosis (ICD10-I70.0). Electronically Signed   By: Alcide Clever M.D.   On: 12/17/2020 01:00   CT HEAD WO CONTRAST ( )  Result Date: 11/16/2020 CLINICAL DATA:  Cardiac arrest EXAM: CT HEAD WITHOUT CONTRAST CT CERVICAL SPINE WITHOUT CONTRAST TECHNIQUE: Multidetector CT imaging of the head and cervical spine was performed following the standard protocol without intravenous contrast. Multiplanar CT image reconstructions of the cervical spine were also generated. COMPARISON:  None. FINDINGS: CT HEAD FINDINGS Brain: No evidence of acute infarction, hemorrhage, hydrocephalus, extra-axial collection or mass lesion/mass effect. Vascular: No hyperdense vessel or unexpected calcification. Skull: Normal. Negative for fracture or focal lesion. Sinuses/Orbits: No acute finding. Other: None. CT CERVICAL SPINE FINDINGS Alignment: Normal. Skull base and vertebrae: No acute fracture. No primary bone lesion or focal pathologic process. Soft tissues and spinal canal: No prevertebral fluid or swelling. No visible canal hematoma. Disc levels: Mild disc space height loss and osteophytosis of the lower cervical levels. Upper chest: Please see separately dictated examination of the chest. Other: None. IMPRESSION: 1. No acute intracranial pathology. 2. No fracture or static subluxation of the cervical spine. 3. Mild multilevel cervical disc degenerative disease. Electronically Signed   By: Jearld Lesch M.D.   On: 10/29/2020 18:25   CT CHEST WO CONTRAST  Result Date: 12/17/2020 CLINICAL DATA:  Organ donor workup EXAM: CT CHEST, ABDOMEN AND PELVIS WITHOUT CONTRAST TECHNIQUE: Multidetector CT imaging of the  chest, abdomen and pelvis was performed following the standard protocol without IV contrast. COMPARISON:  Chest and abdomen from the  previous 2 days, CT from 10/29/2020 FINDINGS: CT CHEST FINDINGS Cardiovascular: Somewhat limited due to lack of IV contrast. Atherosclerotic calcifications of the thoracic aorta are noted. Coronary calcifications are seen. Heart is mildly enlarged in size. Pulmonary artery is not significantly enlarged. Mediastinum/Nodes: Thoracic inlet is within normal limits. Endotracheal tube and gastric catheter are noted. No sizable mediastinal or hilar adenopathy is seen. The esophagus as visualized is within normal limits. Lungs/Pleura: Lungs demonstrate bilateral pleural effusions right greater than left with associated lower lobe consolidation. The upper lobes are well aerated. No sizable parenchymal nodule is noted. Musculoskeletal: Degenerative changes of the thoracic spine are noted. No rib abnormality is seen. CT ABDOMEN PELVIS FINDINGS Hepatobiliary: Liver is within normal limits. Gallbladder is well distended with evidence of vicarious excretion of contrast material. A few small gallstones are noted in the dependent portion of the gallbladder. Multiple punctate densities are noted in the distal aspect of the common bile duct best visualized on the coronal imaging consistent with choledocholithiasis. Pancreas: Pancreas is within normal limits. Spleen: Normal in size without focal abnormality. Adrenals/Urinary Tract: Adrenal glands are unremarkable. Kidneys demonstrate a normal appearance without renal calculi or obstructive changes. The bladder is decompressed by Foley catheter. Stomach/Bowel: Rectal tube is noted in place. No obstructive or inflammatory changes of the colon are seen. The cecum is somewhat mobile in the mid anterior abdomen. The appendix is not well visualized and may have been surgically removed. No inflammatory changes are seen. Small bowel and stomach are within normal  limits. Weighted feeding catheter extends into the proximal duodenum. Vascular/Lymphatic: Atherosclerotic calcifications of the aorta are noted. No aneurysmal dilatation is seen. No significant lymphadenopathy is noted. Reproductive: Status post hysterectomy. No adnexal masses. Other: No abdominal wall hernia or abnormality. No abdominopelvic ascites. Mild changes of anasarca are noted. Musculoskeletal: No acute or significant osseous findings. IMPRESSION: CT of the chest: Mild cardiomegaly with coronary calcifications. Bilateral pleural effusions right greater than left with associated lower lobe consolidation. This has progressed in the interval from the prior CT. CT of the abdomen and pelvis: Cholelithiasis with evidence of choledocholithiasis. No biliary ductal dilatation is noted. Mild changes of anasarca. No other focal abnormality in the abdomen and pelvis is seen. Aortic Atherosclerosis (ICD10-I70.0). Electronically Signed   By: Alcide Clever M.D.   On: 12/14/2020 01:00   CT Angio Chest Pulmonary Embolism (PE) W or WO Contrast  Result Date: 11/07/2020 CLINICAL DATA:  Cardiac arrest, PE suspected EXAM: CT ANGIOGRAPHY CHEST WITH CONTRAST TECHNIQUE: Multidetector CT imaging of the chest was performed using the standard protocol during bolus administration of intravenous contrast. Multiplanar CT image reconstructions and MIPs were obtained to evaluate the vascular anatomy. CONTRAST:  56mL OMNIPAQUE IOHEXOL 350 MG/ML SOLN COMPARISON:  None. FINDINGS: Cardiovascular: Satisfactory opacification of the pulmonary arteries to the segmental level. No evidence of pulmonary embolism. Normal heart size. Three-vessel coronary artery calcifications. No pericardial effusion. Aortic atherosclerosis. 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 Mediastinum/Nodes: No enlarged mediastinal, hilar, or axillary lymph nodes. Thyroid gland, trachea, and esophagus demonstrate no significant  findings. Lungs/Pleura: Endotracheal intubation. Endotracheal tube tip is at the ostium of the right mainstem bronchus (series 6, image 55). Moderate bilateral pleural effusions and associated atelectasis or consolidation. Diffuse bilateral interlobular septal thickening and ground-glass airspace opacity. Mild underlying centrilobular emphysema. Diffuse bilateral bronchial wall thickening. Upper Abdomen: No acute abnormality. Musculoskeletal: No chest wall abnormality. No acute or significant osseous findings. Review of the MIP images  confirms the above findings. IMPRESSION: 1.  Negative examination for pulmonary embolism. 2. Moderate bilateral pleural effusions and associated atelectasis or consolidation. Diffuse bilateral interlobular septal thickening and ground-glass airspace opacity. Diffuse bilateral bronchial wall thickening. Constellation of findings is consistent with pulmonary edema and or infection. 3. Endotracheal intubation. Endotracheal tube tip is at the ostium of the right mainstem bronchus. Recommend slight retraction. 4.  Mild underlying centrilobular emphysema. 5.  Coronary artery disease. 6. 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 Recommend annual imaging followup by CTA or MRA. This recommendation follows 2010 ACCF/AHA/AATS/ACR/ASA/SCA/SCAI/SIR/STS/SVM Guidelines for the Diagnosis and Management of Patients with Thoracic Aortic Disease. Circulation. 2010; 121: Z610-R604. Aortic aneurysm NOS (ICD10-I71.9) Aortic Atherosclerosis (ICD10-I70.0). Electronically Signed   By: Jearld Lesch M.D.   On: 12-17-20 18:42   CT Cervical Spine Wo Contrast  Result Date: 12/17/2020 CLINICAL DATA:  Cardiac arrest EXAM: CT HEAD WITHOUT CONTRAST CT CERVICAL SPINE WITHOUT CONTRAST TECHNIQUE: Multidetector CT imaging of the head and cervical spine was performed following the standard protocol without intravenous contrast. Multiplanar CT image reconstructions of the  cervical spine were also generated. COMPARISON:  None. FINDINGS: CT HEAD FINDINGS Brain: No evidence of acute infarction, hemorrhage, hydrocephalus, extra-axial collection or mass lesion/mass effect. Vascular: No hyperdense vessel or unexpected calcification. Skull: Normal. Negative for fracture or focal lesion. Sinuses/Orbits: No acute finding. Other: None. CT CERVICAL SPINE FINDINGS Alignment: Normal. Skull base and vertebrae: No acute fracture. No primary bone lesion or focal pathologic process. Soft tissues and spinal canal: No prevertebral fluid or swelling. No visible canal hematoma. Disc levels: Mild disc space height loss and osteophytosis of the lower cervical levels. Upper chest: Please see separately dictated examination of the chest. Other: None. IMPRESSION: 1. No acute intracranial pathology. 2. No fracture or static subluxation of the cervical spine. 3. Mild multilevel cervical disc degenerative disease. Electronically Signed   By: Jearld Lesch M.D.   On: 12-17-20 18:25   MR ANGIO HEAD WO CONTRAST  Result Date: 11/25/2020 CLINICAL DATA:  Neuro deficit, stroke suspected EXAM: MRI HEAD WITHOUT CONTRAST MRA HEAD WITHOUT CONTRAST MRA NECK WITHOUT AND WITH CONTRAST TECHNIQUE: Multiplanar, multi-echo pulse sequences of the brain and surrounding structures were acquired without intravenous contrast. Angiographic images of the Circle of Willis were acquired using MRA technique without intravenous contrast. Angiographic images of the neck were acquired using MRA technique without and with intravenous contrast. Carotid stenosis measurements (when applicable) are obtained utilizing NASCET criteria, using the distal internal carotid diameter as the denominator. CONTRAST:  8mL GADAVIST GADOBUTROL 1 MMOL/ML IV SOLN COMPARISON:  And prior MRI, correlation is made with CT head 12/17/20 FINDINGS: MRI HEAD FINDINGS Brain: Areas of restricted diffusion in the cortex and white matter bilateral frontal, parietal,  occipital, posterior temporal lobes, as well as the bilateral cerebellar hemispheres. Additional punctate focus of restricted diffusion in the left caudate head. Some of this is in a watershed distribution (series 5, image 94), while other areas are more focal. The cortical areas are associated with gyral swelling (series 12, images 15 and 20, for example). Areas of curvilinear susceptibility in the right frontal lobe in the area of infarct (series 15, image 36), likely hemorrhage. No extra-axial fluid collection. No mass, mass effect, or midline shift. Remote left thalamic lacunar infarct. Vascular: See MRA, below. Skull and upper cervical spine: Normal marrow signal. Sinuses/Orbits: Air-fluid levels in the right greater than left maxillary sinus, with trace fluid in the left sphenoid sinus. NG  tube seen in the nasal cavity. Partially visualized endotracheal tube in the oral cavity. The orbits are unremarkable. Other: Fluid in the bilateral mastoid air cells. MRA HEAD FINDINGS Anterior circulation: Both internal carotid arteries are patent to the termini, without stenosis or other abnormality; mildly decreased signal in the proximal cavernous carotid is felt to be artifactual. A1 segments patent. Anterior cerebral arteries are patent to their distal aspects. No significant M1 stenosis or occlusion. Normal MCA bifurcations. Distal MCA branches well perfused and symmetric. Posterior circulation: Vertebral arteries patent to the vertebrobasilar junction without stenosis. Focal area of poor flow visualization in the basilar artery (series 5, image 73), which may be artifactual. The basilar is otherwise patent to its distal aspect. Superior cerebral arteries patent bilaterally. PCAs well perfused to their distal aspects without stenosis.Posterior communicating arteries are visualized bilaterally. Anatomic variants: None significant. MRA NECK FINDINGS Aortic arch: Normal three-vessel arch. No significant stenosis or  aneurysm. Right carotid system: Patent, without hemodynamically significant stenosis, occlusion, or aneurysm. Plaque at the bifurcation. Left carotid system: Patent, without hemodynamically significant stenosis, occlusion, or aneurysm. Plaque at the bifurcation. Vertebral arteries: Limited visualization of the origin of the right vertebral artery secondary to artifact. The vertebral arteries are otherwise patent, without significant stenosis, occlusion, or aneurysm. Other: None IMPRESSION: 1. Multiple areas of restricted diffusion throughout the bilateral cerebral and cerebellar hemispheres, some of which are in a watershed pattern, while others are more likely to be embolic, given different vascular territories. Likely petechial hemorrhage in a right frontal lobe cortical infarct, but no evidence of significant hemorrhagic transformation or midline shift. 2. No large vessel occlusion in the intracranial vasculature. 3. No hemodynamically significant stenosis in the neck. Multiple attempts were made to contact a provider taking care of this patient to convey these results. Electronically Signed   By: Wiliam Ke M.D.   On: 11/25/2020 17:32   MR ANGIO NECK W WO CONTRAST  Result Date: 11/25/2020 CLINICAL DATA:  Neuro deficit, stroke suspected EXAM: MRI HEAD WITHOUT CONTRAST MRA HEAD WITHOUT CONTRAST MRA NECK WITHOUT AND WITH CONTRAST TECHNIQUE: Multiplanar, multi-echo pulse sequences of the brain and surrounding structures were acquired without intravenous contrast. Angiographic images of the Circle of Willis were acquired using MRA technique without intravenous contrast. Angiographic images of the neck were acquired using MRA technique without and with intravenous contrast. Carotid stenosis measurements (when applicable) are obtained utilizing NASCET criteria, using the distal internal carotid diameter as the denominator. CONTRAST:  8mL GADAVIST GADOBUTROL 1 MMOL/ML IV SOLN COMPARISON:  And prior MRI,  correlation is made with CT head 10/30/2020 FINDINGS: MRI HEAD FINDINGS Brain: Areas of restricted diffusion in the cortex and white matter bilateral frontal, parietal, occipital, posterior temporal lobes, as well as the bilateral cerebellar hemispheres. Additional punctate focus of restricted diffusion in the left caudate head. Some of this is in a watershed distribution (series 5, image 94), while other areas are more focal. The cortical areas are associated with gyral swelling (series 12, images 15 and 20, for example). Areas of curvilinear susceptibility in the right frontal lobe in the area of infarct (series 15, image 36), likely hemorrhage. No extra-axial fluid collection. No mass, mass effect, or midline shift. Remote left thalamic lacunar infarct. Vascular: See MRA, below. Skull and upper cervical spine: Normal marrow signal. Sinuses/Orbits: Air-fluid levels in the right greater than left maxillary sinus, with trace fluid in the left sphenoid sinus. NG tube seen in the nasal cavity. Partially visualized endotracheal tube in the oral cavity. The  orbits are unremarkable. Other: Fluid in the bilateral mastoid air cells. MRA HEAD FINDINGS Anterior circulation: Both internal carotid arteries are patent to the termini, without stenosis or other abnormality; mildly decreased signal in the proximal cavernous carotid is felt to be artifactual. A1 segments patent. Anterior cerebral arteries are patent to their distal aspects. No significant M1 stenosis or occlusion. Normal MCA bifurcations. Distal MCA branches well perfused and symmetric. Posterior circulation: Vertebral arteries patent to the vertebrobasilar junction without stenosis. Focal area of poor flow visualization in the basilar artery (series 5, image 73), which may be artifactual. The basilar is otherwise patent to its distal aspect. Superior cerebral arteries patent bilaterally. PCAs well perfused to their distal aspects without stenosis.Posterior  communicating arteries are visualized bilaterally. Anatomic variants: None significant. MRA NECK FINDINGS Aortic arch: Normal three-vessel arch. No significant stenosis or aneurysm. Right carotid system: Patent, without hemodynamically significant stenosis, occlusion, or aneurysm. Plaque at the bifurcation. Left carotid system: Patent, without hemodynamically significant stenosis, occlusion, or aneurysm. Plaque at the bifurcation. Vertebral arteries: Limited visualization of the origin of the right vertebral artery secondary to artifact. The vertebral arteries are otherwise patent, without significant stenosis, occlusion, or aneurysm. Other: None IMPRESSION: 1. Multiple areas of restricted diffusion throughout the bilateral cerebral and cerebellar hemispheres, some of which are in a watershed pattern, while others are more likely to be embolic, given different vascular territories. Likely petechial hemorrhage in a right frontal lobe cortical infarct, but no evidence of significant hemorrhagic transformation or midline shift. 2. No large vessel occlusion in the intracranial vasculature. 3. No hemodynamically significant stenosis in the neck. Multiple attempts were made to contact a provider taking care of this patient to convey these results. Electronically Signed   By: Wiliam Ke M.D.   On: 11/25/2020 17:32   MR BRAIN WO CONTRAST  Result Date: 11/25/2020 CLINICAL DATA:  Neuro deficit, stroke suspected EXAM: MRI HEAD WITHOUT CONTRAST MRA HEAD WITHOUT CONTRAST MRA NECK WITHOUT AND WITH CONTRAST TECHNIQUE: Multiplanar, multi-echo pulse sequences of the brain and surrounding structures were acquired without intravenous contrast. Angiographic images of the Circle of Willis were acquired using MRA technique without intravenous contrast. Angiographic images of the neck were acquired using MRA technique without and with intravenous contrast. Carotid stenosis measurements (when applicable) are obtained utilizing  NASCET criteria, using the distal internal carotid diameter as the denominator. CONTRAST:  8mL GADAVIST GADOBUTROL 1 MMOL/ML IV SOLN COMPARISON:  And prior MRI, correlation is made with CT head 10/25/2020 FINDINGS: MRI HEAD FINDINGS Brain: Areas of restricted diffusion in the cortex and white matter bilateral frontal, parietal, occipital, posterior temporal lobes, as well as the bilateral cerebellar hemispheres. Additional punctate focus of restricted diffusion in the left caudate head. Some of this is in a watershed distribution (series 5, image 94), while other areas are more focal. The cortical areas are associated with gyral swelling (series 12, images 15 and 20, for example). Areas of curvilinear susceptibility in the right frontal lobe in the area of infarct (series 15, image 36), likely hemorrhage. No extra-axial fluid collection. No mass, mass effect, or midline shift. Remote left thalamic lacunar infarct. Vascular: See MRA, below. Skull and upper cervical spine: Normal marrow signal. Sinuses/Orbits: Air-fluid levels in the right greater than left maxillary sinus, with trace fluid in the left sphenoid sinus. NG tube seen in the nasal cavity. Partially visualized endotracheal tube in the oral cavity. The orbits are unremarkable. Other: Fluid in the bilateral mastoid air cells. MRA HEAD FINDINGS Anterior circulation: Both  internal carotid arteries are patent to the termini, without stenosis or other abnormality; mildly decreased signal in the proximal cavernous carotid is felt to be artifactual. A1 segments patent. Anterior cerebral arteries are patent to their distal aspects. No significant M1 stenosis or occlusion. Normal MCA bifurcations. Distal MCA branches well perfused and symmetric. Posterior circulation: Vertebral arteries patent to the vertebrobasilar junction without stenosis. Focal area of poor flow visualization in the basilar artery (series 5, image 73), which may be artifactual. The basilar is  otherwise patent to its distal aspect. Superior cerebral arteries patent bilaterally. PCAs well perfused to their distal aspects without stenosis.Posterior communicating arteries are visualized bilaterally. Anatomic variants: None significant. MRA NECK FINDINGS Aortic arch: Normal three-vessel arch. No significant stenosis or aneurysm. Right carotid system: Patent, without hemodynamically significant stenosis, occlusion, or aneurysm. Plaque at the bifurcation. Left carotid system: Patent, without hemodynamically significant stenosis, occlusion, or aneurysm. Plaque at the bifurcation. Vertebral arteries: Limited visualization of the origin of the right vertebral artery secondary to artifact. The vertebral arteries are otherwise patent, without significant stenosis, occlusion, or aneurysm. Other: None IMPRESSION: 1. Multiple areas of restricted diffusion throughout the bilateral cerebral and cerebellar hemispheres, some of which are in a watershed pattern, while others are more likely to be embolic, given different vascular territories. Likely petechial hemorrhage in a right frontal lobe cortical infarct, but no evidence of significant hemorrhagic transformation or midline shift. 2. No large vessel occlusion in the intracranial vasculature. 3. No hemodynamically significant stenosis in the neck. Multiple attempts were made to contact a provider taking care of this patient to convey these results. Electronically Signed   By: Wiliam Ke M.D.   On: 11/25/2020 17:32   DG Chest Port 1 View  Result Date: 11/28/2020 CLINICAL DATA:  Post central line placement. EXAM: PORTABLE CHEST 1 VIEW COMPARISON:  Chest x-ray 11/27/2020. FINDINGS: Right-sided central venous catheter tip projects over the mid SVC. Enteric tube extends below the diaphragm. Endotracheal tube tip is 9 mm above the carina. Small pleural effusions and bibasilar opacities are stable. There is no pneumothorax. Cardiomediastinal silhouette is unchanged, the  heart is enlarged. No acute fractures are seen. IMPRESSION: 1. Right-sided central venous catheter tip projects over the mid SVC. 2. Enteric tube extends below the diaphragm. 3. Endotracheal tube tip 9 mm above carina. 4. Stable bilateral pleural effusions and bibasilar infiltrates. Electronically Signed   By: Darliss Cheney M.D.   On: 11/28/2020 22:13   DG CHEST PORT 1 VIEW  Result Date: 11/27/2020 CLINICAL DATA:  Cardiac arrest, hypoxia. EXAM: PORTABLE CHEST 1 VIEW COMPARISON:  November 26, 2020. FINDINGS: Stable cardiomediastinal silhouette. Endotracheal tube is in good position. No pneumothorax is noted. Stable bibasilar atelectasis or edema is noted with associated pleural effusions. Bony thorax is unremarkable. IMPRESSION: Stable bibasilar atelectasis or edema with associated pleural effusions. Electronically Signed   By: Lupita Raider M.D.   On: 11/27/2020 13:16   DG Chest Port 1 View  Result Date: 11/26/2020 CLINICAL DATA:  Hypoxia. EXAM: PORTABLE CHEST 1 VIEW COMPARISON:  November 24, 2020. FINDINGS: Stable cardiomediastinal silhouette. Endotracheal tube is in good position. Bibasilar atelectasis or edema is noted with associated pleural effusions. Bony thorax is unremarkable. IMPRESSION: Bibasilar atelectasis or edema is noted with associated pleural effusions. Electronically Signed   By: Lupita Raider M.D.   On: 11/26/2020 15:41   DG CHEST PORT 1 VIEW  Result Date: 11/24/2020 CLINICAL DATA:  Pulmonary edema EXAM: PORTABLE CHEST 1 VIEW COMPARISON:  November 23, 2020 FINDINGS: The ETT is in good position. The NG tube terminates below today's film. No pneumothorax. Bilateral pleural effusions with underlying atelectasis, mildly more pronounced in the interval. No change in the cardiomediastinal silhouette. No other abnormalities. IMPRESSION: Bilateral pleural effusions with underlying atelectasis are mildly more pronounced in the interval. No other acute abnormalities or changes. Electronically  Signed   By: Gerome Sam III M.D.   On: 11/24/2020 12:37   DG Chest Port 1 View  Result Date: 11/23/2020 CLINICAL DATA:  Shortness of breath EXAM: PORTABLE CHEST 1 VIEW COMPARISON:  November 22, 2020 FINDINGS: The ET tube terminates 1.5 cm above the carina. The NG tube terminates in the stomach. No pneumothorax. Bibasilar pulmonary opacities identified. No overt edema. No other interval changes. IMPRESSION: 1. Support apparatus as above. ET tube terminates 1.5 cm above the carina. Recommend withdrawing 1 cm. 2. Bibasilar opacities could represent atelectasis, pneumonia, or layering effusions with underlying atelectasis. Effusions were not seen on yesterday's CT scan. These results will be called to the ordering clinician or representative by the Radiologist Assistant, and communication documented in the PACS or [employer]. Electronically Signed   By: Gerome Sam III M.D.   On: 11/23/2020 07:43   DG CHEST PORT 1 VIEW  Result Date: 11/07/2020 CLINICAL DATA:  Endotracheal tube repositioned. EXAM: PORTABLE CHEST 1 VIEW COMPARISON:  Chest x-ray 11/02/2020. FINDINGS: Endotracheal tube tip is just at the level of the carina. Enteric tube extends below the diaphragm. The cardiomediastinal silhouette is stable, the heart is mildly enlarged. Diffuse interstitial opacities are again seen. There are increasing airspace opacities throughout the left mid lung. Costophrenic angles are clear. There is no pneumothorax. IMPRESSION: 1. Endotracheal tube tip at the level of the carina. Recommend repositioning. 2. Findings suspicious for pulmonary edema, increasing on the left. Electronically Signed   By: Darliss Cheney M.D.   On: 11/14/2020 17:05   DG Chest Portable 1 View  Result Date: 11/21/2020 CLINICAL DATA:  Cardiac arrest, ETT placement EXAM: PORTABLE CHEST 1 VIEW COMPARISON:  None. FINDINGS: Cardiomegaly. Diffuse bilateral interstitial pulmonary opacity. Endotracheal tube is positioned with tip at the  carina. Esophagogastric tube with tip and side port below the diaphragm. IMPRESSION: 1. Endotracheal tube is positioned with tip at the carina. Recommend retraction. 2. Esophagogastric tube with tip and side port below the diaphragm. 3. Diffuse bilateral interstitial pulmonary opacity, likely edema in the setting of cardiomegaly. Electronically Signed   By: Jearld Lesch M.D.   On: 10/26/2020 16:28   DG Abd Portable 1V  Result Date: 11/27/2020 CLINICAL DATA:  Feeding tube placement EXAM: PORTABLE ABDOMEN - 1 VIEW COMPARISON:  None. FINDINGS: Feeding tube tip is in the gastric antrum. IMPRESSION: Feeding tube tip is in the gastric antrum. Electronically Signed   By: Paulina Fusi M.D.   On: 11/27/2020 16:18   ECHOCARDIOGRAM COMPLETE  Result Date: 11/23/2020    ECHOCARDIOGRAM REPORT   Patient Name:   Samantha Cooley [facility] Date of Exam: 11/23/2020 Medical Rec #:  [id number]      Height:       64.0 in Accession #:    [id number]     Weight:       160.3 lb Date of Birth:  [birth year]       BSA:          1.781 m Patient Age:    60 years       BP:           99/51 mmHg Patient  Gender: F              HR:           57 bpm. Exam Location:  Inpatient Procedure: 2D Echo, Cardiac Doppler, Color Doppler and Intracardiac            Opacification Agent Indications:    Cardiac arrest  History:        Patient has no prior history of Echocardiogram examinations.                 Risk Factors:Hypertension and Diabetes.  Sonographer:    Ross Ludwig RDCS (AE) Referring Phys: [id number] VINEET SOOD  Sonographer Comments: Echo performed with patient supine and on artificial respirator, Technically difficult study due to poor echo windows, suboptimal parasternal window and suboptimal apical window. IMPRESSIONS  1. No mural thrombus - filling defects in the LV cavity noted with Definity contrast, suspect related to hypertrophied papillary muscle and subchordal structures. There also appears to be color flow across the basal septum concerning for a  membranous VSD or possible eccentric AI jet. Left ventricular ejection fraction, by estimation, is 55 to 60%. The left ventricle has normal function. The left ventricle has no regional wall motion abnormalities. There is moderate left ventricular hypertrophy. Left ventricular diastolic parameters are consistent with Grade I diastolic dysfunction (impaired relaxation).  2. Right ventricular systolic function is low normal. The right ventricular size is normal.  3. The mitral valve was not well visualized. No evidence of mitral valve regurgitation.  4. The aortic valve was not well visualized. There is moderate calcification of the aortic valve. Aortic valve regurgitation is mild. Mild to moderate aortic valve sclerosis/calcification is present, without any evidence of aortic stenosis. Aortic regurgitation PHT measures 495 msec. Aortic valve mean gradient measures 4.0 mmHg. Comparison(s): No prior Echocardiogram. Conclusion(s)/Recommendation(s): Findings concerning for aortic valve disease with AI and possible VSD, would recommend Transesophageal Echocardiogram for clarification given the limited echo windows with this study. FINDINGS  Left Ventricle: No mural thrombus - filling defects in the LV cavity noted with Definity contrast, suspect related to hypertrophied papillary muscle and subchordal structures. There also appears to be color flow across the basal septum concerning for a membranous VSD or possible eccentric AI jet. Left ventricular ejection fraction, by estimation, is 55 to 60%. The left ventricle has normal function. The left ventricle has no regional wall motion abnormalities. Definity contrast agent was given IV to delineate the left ventricular endocardial borders. The left ventricular internal cavity size was normal in size. There is moderate left ventricular hypertrophy. Left ventricular diastolic parameters are consistent with Grade I diastolic dysfunction (impaired relaxation). Indeterminate  filling pressures. Right Ventricle: The right ventricular size is normal. No increase in right ventricular wall thickness. Right ventricular systolic function is low normal. Left Atrium: Left atrial size was normal in size. Right Atrium: Right atrial size was normal in size. Pericardium: There is no evidence of pericardial effusion. Mitral Valve: The mitral valve was not well visualized. No evidence of mitral valve regurgitation. Tricuspid Valve: The tricuspid valve is not well visualized. Tricuspid valve regurgitation is not demonstrated. Aortic Valve: The aortic valve was not well visualized. There is moderate calcification of the aortic valve. Aortic valve regurgitation is mild. Aortic regurgitation PHT measures 495 msec. Mild to moderate aortic valve sclerosis/calcification is present,  without any evidence of aortic stenosis. Aortic valve mean gradient measures 4.0 mmHg. Aortic valve peak gradient measures 8.2 mmHg. Aortic valve area, by VTI measures 1.86  cm. Pulmonic Valve: The pulmonic valve was not well visualized. Pulmonic valve regurgitation is trivial. Aorta: The ascending aorta was not well visualized. Venous: IVC assessment for right atrial pressure unable to be performed due to mechanical ventilation. IAS/Shunts: No atrial level shunt detected by color flow Doppler.  LEFT VENTRICLE PLAX 2D LVIDd:         4.00 cm  Diastology LVIDs:         2.80 cm  LV e' medial:    5.22 cm/s LV PW:         1.40 cm  LV E/e' medial:  11.1 LV IVS:        1.30 cm  LV e' lateral:   5.00 cm/s LVOT diam:     2.00 cm  LV E/e' lateral: 11.6 LV SV:         51 LV SV Index:   29 LVOT Area:     3.14 cm  RIGHT VENTRICLE             IVC RV Basal diam:  2.60 cm     IVC diam: 2.10 cm RV S prime:     10.00 cm/s TAPSE (M-mode): 1.4 cm LEFT ATRIUM           Index       RIGHT ATRIUM           Index LA diam:      2.80 cm 1.57 cm/m  RA Area:     14.20 cm LA Vol (A2C): 18.1 ml 10.16 ml/m RA Volume:   40.50 ml  22.74 ml/m LA Vol (A4C):  33.5 ml 18.81 ml/m  AORTIC VALVE AV Area (Vmax):    2.00 cm AV Area (Vmean):   1.88 cm AV Area (VTI):     1.86 cm AV Vmax:           143.00 cm/s AV Vmean:          95.500 cm/s AV VTI:            0.274 m AV Peak Grad:      8.2 mmHg AV Mean Grad:      4.0 mmHg LVOT Vmax:         91.00 cm/s LVOT Vmean:        57.000 cm/s LVOT VTI:          0.162 m LVOT/AV VTI ratio: 0.59 AI PHT:            495 msec  AORTA Ao Root diam: 3.10 cm MITRAL VALVE MV Area (PHT): 1.71 cm    SHUNTS MV Decel Time: 444 msec    Systemic VTI:  0.16 m MV E velocity: 58.20 cm/s  Systemic Diam: 2.00 cm MV A velocity: 83.80 cm/s MV E/A ratio:  0.69 Zoila Shutter MD Electronically signed by Zoila Shutter MD Signature Date/Time: 11/23/2020/4:53:16 PM    Final    ECHO TEE  Result Date: 11/26/2020    TRANSESOPHOGEAL ECHO REPORT   Patient Name:   Samantha Cooley [facility] Date of Exam: 11/26/2020 Medical Rec #:  [id number]      Height:       64.0 in Accession #:    [id number]     Weight:       171.7 lb Date of Birth:  [birth year]       BSA:          1.834 m Patient Age:    60 years       BP:  153/68 mmHg Patient Gender: F              HR:           61 bpm. Exam Location:  Inpatient Procedure: Transesophageal Echo, 3D Echo, Color Doppler, Cardiac Doppler and            Saline Contrast Bubble Study Indications:     Stroke  History:         Patient has prior history of Echocardiogram examinations, most                  recent 11/23/2020. Risk Factors:Hypertension and Diabetes.  Sonographer:     Eulah Pont RDCS Referring Phys:  [id number] Lisette Abu HILTY Diagnosing Phys: Zoila Shutter MD PROCEDURE: After discussion of the risks and benefits of a TEE, an informed consent was obtained from the patient. The transesophogeal probe was passed without difficulty through the esophogus of the patient. Sedation performed by different physician. The patient's vital signs; including heart rate, blood pressure, and oxygen saturation; remained stable throughout the procedure.  The patient developed no complications during the procedure. IMPRESSIONS  1. Left ventricular ejection fraction, by estimation, is 55 to 60%. The left ventricle has normal function. There is moderate left ventricular hypertrophy.  2. Right ventricular systolic function is normal. The right ventricular size is normal.  3. No left atrial/left atrial appendage thrombus was detected.  4. The mitral valve is grossly normal. Mild mitral valve regurgitation. No evidence of mitral stenosis.  5. There is a 1.5 x 0.5 cm mobile mass associated with the non-coronary cusp of the aortic valve with thickening of the remaining leaflets, this is suggestive of vegetation. The aortic valve is tricuspid. Aortic valve regurgitation is severe and eccentric with multiple jets and a wide vena contracta.  6. Agitated saline contrast bubble study was negative, with no evidence of any interatrial shunt. 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 FINDINGS  Left Ventricle: Left ventricular ejection fraction, by estimation, is 55 to 60%. The left ventricle has normal function. The left ventricular internal cavity size was normal in size. There is moderate left ventricular hypertrophy. Right Ventricle: The right ventricular size is normal. No increase in right ventricular wall thickness. Right ventricular systolic function is normal. Left Atrium: Left atrial size was normal in size. No left atrial/left atrial appendage thrombus was detected. Right Atrium: Right atrial size was normal in size. Pericardium: There is no evidence of pericardial effusion. Mitral Valve: The mitral valve is grossly normal. Mild mitral valve regurgitation. No evidence of mitral valve stenosis. Tricuspid Valve: The tricuspid valve is grossly normal. Tricuspid  valve regurgitation is trivial. Aortic Valve: There is a 1.5 x 0.5 cm mobile mass associated with the non-coronary cusp of the aortic valve with thickening of the remaining leaflets, this is suggestive of vegetation. The aortic valve is tricuspid. Aortic valve regurgitation is severe. Pulmonic Valve: The pulmonic valve was grossly normal. Pulmonic valve regurgitation is trivial. Aorta: The aortic root and ascending aorta are structurally normal, with no evidence of dilitation. IAS/Shunts: No atrial level shunt detected by color flow Doppler. Agitated saline contrast was given intravenously to evaluate for intracardiac shunting. Agitated saline contrast bubble study was negative, with no evidence of any interatrial shunt. Zoila Shutter MD Electronically signed by Zoila Shutter MD Signature Date/Time: 11/26/2020/2:41:09 PM  Final     Microbiology Recent Results (from the past 240 hour(s))  Resp Panel by RT-PCR (Flu A&B, Covid) Nasopharyngeal Swab     Status: None   Collection Time: 10/25/2020  4:22 PM   Specimen: Nasopharyngeal Swab; Nasopharyngeal(NP) swabs in vial transport medium  Result Value Ref Range Status   SARS Coronavirus 2 by RT PCR NEGATIVE NEGATIVE Final    Comment: (NOTE) SARS-CoV-2 target nucleic acids are NOT DETECTED.  The SARS-CoV-2 RNA is generally detectable in upper respiratory specimens during the acute phase of infection. The lowest concentration of SARS-CoV-2 viral copies this assay can detect is 138 copies/mL. A negative result does not preclude SARS-Cov-2 infection and should not be used as the sole basis for treatment or other patient management decisions. A negative result may occur with  improper specimen collection/handling, submission of specimen other than nasopharyngeal swab, presence of viral mutation(s) within the areas targeted by this assay, and inadequate number of viral copies(<138 copies/mL). A negative result must be combined with clinical observations,  patient history, and epidemiological information. The expected result is Negative.  Fact Sheet for Patients:  BloggerCourse.com  Fact Sheet for Healthcare Providers:  SeriousBroker.it  This test is no t yet approved or cleared by the [place] FDA and  has been authorized for detection and/or diagnosis of SARS-CoV-2 by FDA under an Emergency Use Authorization (EUA). This EUA will remain  in effect (meaning this test can be used) for the duration of the COVID-19 declaration under Section 564(b)(1) of the Act, 21 U.S.C.section 360bbb-3(b)(1), unless the authorization is terminated  or revoked sooner.       Influenza A by PCR NEGATIVE NEGATIVE Final   Influenza B by PCR NEGATIVE NEGATIVE Final    Comment: (NOTE) The Xpert Xpress SARS-CoV-2/FLU/RSV plus assay is intended as an aid in the diagnosis of influenza from Nasopharyngeal swab specimens and should not be used as a sole basis for treatment. Nasal washings and aspirates are unacceptable for Xpert Xpress SARS-CoV-2/FLU/RSV testing.  Fact Sheet for Patients: BloggerCourse.com  Fact Sheet for Healthcare Providers: SeriousBroker.it  This test is not yet approved or cleared by the [place] FDA and has been authorized for detection and/or diagnosis of SARS-CoV-2 by FDA under an Emergency Use Authorization (EUA). This EUA will remain in effect (meaning this test can be used) for the duration of the COVID-19 declaration under Section 564(b)(1) of the Act, 21 U.S.C. section 360bbb-3(b)(1), unless the authorization is terminated or revoked.  Performed at [facility], [address] [place], [place] [address]   Culture, blood (routine x 2)     Status: None   Collection Time: 11/07/2020  7:56 PM   Specimen: BLOOD RIGHT HAND  Result Value Ref Range Status   Specimen Description BLOOD RIGHT HAND  Final   Special  Requests AEROBIC BOTTLE ONLY Blood Culture adequate volume  Final   Culture   Final    NO GROWTH 5 DAYS Performed at [facility], [address] [place], [place] [address]    Report Status 11/27/2020 FINAL  Final  Culture, blood (routine x 2)     Status: None   Collection Time: 10/27/2020  8:06 PM   Specimen: BLOOD RIGHT HAND  Result Value Ref Range Status   Specimen Description BLOOD RIGHT HAND  Final   Special Requests AEROBIC BOTTLE ONLY Blood Culture adequate volume  Final   Culture   Final    NO GROWTH 5 DAYS Performed at [facility], [address], [place], [place] [address]    Report Status 11/27/2020 FINAL  Final  MRSA Next Gen by PCR, Nasal     Status: None   Collection Time: 11/26/20 12:18 AM   Specimen: Nasal Mucosa; Nasal Swab  Result Value Ref Range Status   MRSA by PCR Next Gen NOT DETECTED NOT DETECTED Final    Comment: (NOTE) The GeneXpert MRSA Assay (FDA approved for NASAL specimens only), is one component of a comprehensive MRSA colonization surveillance program. It is not intended to diagnose MRSA infection nor to guide or monitor treatment for MRSA infections. Test performance is not FDA approved in patients less than 8 years old. Performed at [facility], [address], [place], [place] [address]   Culture, Respiratory w Gram Stain     Status: None   Collection Time: 11/27/20  9:00 AM   Specimen: Tracheal Aspirate; Respiratory  Result Value Ref Range Status   Specimen Description TRACHEAL ASPIRATE  Final   Special Requests NONE  Final   Gram Stain   Final    ABUNDANT WBC PRESENT, PREDOMINANTLY PMN RARE YEAST Performed at [facility], [address] [place], [place] [address]    Culture FEW CANDIDA DUBLINIENSIS  Final   Report Status 12/08/2020 FINAL  Final  Culture, blood (routine x 2)     Status: None (Preliminary result)   Collection Time: 11/28/20  7:43 PM   Specimen: BLOOD  Result Value Ref Range Status   Specimen  Description BLOOD BLOOD LEFT HAND  Final   Special Requests   Final    BOTTLES DRAWN AEROBIC ONLY Blood Culture adequate volume   Culture   Final    NO GROWTH < 24 HOURS Performed at [facility], [address], [place], [place] [address]    Report Status PENDING  Incomplete  Culture, blood (routine x 2)     Status: None (Preliminary result)   Collection Time: 11/28/20  7:44 PM   Specimen: BLOOD  Result Value Ref Range Status   Specimen Description BLOOD BLOOD LEFT HAND  Final   Special Requests   Final    BOTTLES DRAWN AEROBIC ONLY Blood Culture adequate volume   Culture   Final    NO GROWTH < 24 HOURS Performed at [facility], [address], [place], [place] [address]    Report Status PENDING  Incomplete  Urine Culture     Status: None   Collection Time: 12/21/2020  4:03 AM   Specimen: Urine, Catheterized  Result Value Ref Range Status   Specimen Description URINE, CATHETERIZED  Final   Special Requests NONE  Final   Culture   Final    NO GROWTH Performed at [facility], [address], [place], [place] [address]    Report Status 12/21/2020 FINAL  Final  Resp Panel by RT-PCR (Flu A&B, Covid) Nasopharyngeal Swab     Status: None   Collection Time: 11/24/2020  5:36 PM   Specimen: Nasopharyngeal Swab; Nasopharyngeal(NP) swabs in vial transport medium  Result Value Ref Range Status   SARS Coronavirus 2 by RT PCR NEGATIVE NEGATIVE Final    Comment: (NOTE) SARS-CoV-2 target nucleic acids are NOT DETECTED.  The SARS-CoV-2 RNA is generally detectable in upper respiratory specimens during the acute phase of infection. The lowest concentration of SARS-CoV-2 viral copies this assay can detect is 138 copies/mL. A negative result does not preclude SARS-Cov-2 infection and should not be  used as the sole basis for treatment or other patient management decisions. A negative result may occur with  improper specimen collection/handling, submission of specimen  other than nasopharyngeal swab, presence of viral mutation(s) within the areas targeted by this assay, and inadequate number of viral copies(<138 copies/mL). A negative result must be combined with clinical observations, patient history, and epidemiological information. The expected result is Negative.  Fact Sheet for Patients:  BloggerCourse.com  Fact Sheet for Healthcare Providers:  SeriousBroker.it  This test is no t yet approved or cleared by the [place] FDA and  has been authorized for detection and/or diagnosis of SARS-CoV-2 by FDA under an Emergency Use Authorization (EUA). This EUA will remain  in effect (meaning this test can be used) for the duration of the COVID-19 declaration under Section 564(b)(1) of the Act, 21 U.S.C.section 360bbb-3(b)(1), unless the authorization is terminated  or revoked sooner.       Influenza A by PCR NEGATIVE NEGATIVE Final   Influenza B by PCR NEGATIVE NEGATIVE Final    Comment: (NOTE) The Xpert Xpress SARS-CoV-2/FLU/RSV plus assay is intended as an aid in the diagnosis of influenza from Nasopharyngeal swab specimens and should not be used as a sole basis for treatment. Nasal washings and aspirates are unacceptable for Xpert Xpress SARS-CoV-2/FLU/RSV testing.  Fact Sheet for Patients: BloggerCourse.com  Fact Sheet for Healthcare Providers: SeriousBroker.it  This test is not yet approved or cleared by the [place] FDA and has been authorized for detection and/or diagnosis of SARS-CoV-2 by FDA under an Emergency Use Authorization (EUA). This EUA will remain in effect (meaning this test can be used) for the duration of the COVID-19 declaration under Section 564(b)(1) of the Act, 21 U.S.C. section 360bbb-3(b)(1), unless the authorization is terminated or revoked.  Performed at [facility], [address], [place],  [place] [address]     Lab Basic Metabolic Panel: Recent Labs  Lab 11/24/20 [phone] 11/24/20 1645 11/25/20 0341 11/27/20 0340 11/27/20 1508 11/28/20 0511 11/28/20 2118 11/25/2020 0655 12/11/2020 1233 12/11/2020 1258 12/09/2020 1800 12/09/2020 1810  NA 132*  --    < > 135   < > 135 137 136 137 137  --  136  K 4.1  --    < > 4.2   < > 3.9 3.9 3.7 3.9 3.8  --  4.0  CL 106  --    < > 106   < > 101 101 103 102  --   --  101  CO2 16*  --    < > 22   < > --   --  25  GLUCOSE 178*  --    < > 117*   < > 200* 159* 146* 175*  --   --  166*  BUN 21  --    < > 16   < > 26* 27* 25* 24*  --   --  22  CREATININE 1.30*  --    < > 0.98   < > 1.26* 1.15* 1.00 1.10*  --   --  1.00  CALCIUM 6.9*  --    < > 7.9*   < > 8.3* 8.4* 8.3* 8.2*  --   --  8.2*  MG 1.6* 1.9  --  2.1  --  1.8 2.1 2.0  --   --  2.1  --   PHOS 1.9* 2.8  --   --   --   --  4.7*  4.5  --   --  4.6  --    < > = values in this interval not displayed.   Liver Function Tests: Recent Labs  Lab 11/26/20 0323 11/28/20 2118 12/23/2020 0655 12/19/2020 1233 12/14/2020 1810  AST 21 30 53* 53* 43*  ALT 24 27 36 38 36  ALKPHOS 80 193* 191* 190* 187*  BILITOT 0.8 0.8 1.2 0.8 0.9  PROT 5.1* 5.2* 5.2* 5.1* 5.1*  ALBUMIN 2.3* 2.2* 2.2* 2.2* 2.2*   Recent Labs  Lab 11/28/20 2118 11/24/2020 0655 12/15/2020 1810  LIPASE 90* 96* 77*  AMYLASE 53 59 48   No results for input(s): AMMONIA in the last 168 hours. CBC: Recent Labs  Lab 11/24/20 0357 11/24/20 2134 11/25/20 0341 11/26/20 0323 11/27/20 0340 11/28/20 0511 12/07/2020 0143 12/05/2020 0431 12/13/2020 1258 11/23/2020 1700  WBC 9.5   < > 7.4 6.7 6.0 7.2 6.9 7.1  --  7.2  NEUTROABS 7.4  --  6.0 4.8 4.3 5.1  --   --   --   --   HGB 11.1*   < > 9.9* 9.2* 8.5* 8.6* 8.0* 8.2* 8.5* 8.3*  HCT 32.5*   < > 30.0* 28.1* 26.4* 24.8* 23.5* 24.6* 25.0* 25.2*  MCV 99.4   < > 101.0* 100.7* 101.9* 96.9 98.7 98.8  --  99.6  PLT PLATELET CLUMPS NOTED ON SMEAR, UNABLE TO ESTIMATE   < > 61* 61* 92* 141* 172 181   --  240   < > = values in this interval not displayed.   Cardiac Enzymes: No results for input(s): CKTOTAL, CKMB, CKMBINDEX, TROPONINI in the last 168 hours. Sepsis Labs: Recent Labs  Lab 11/28/20 0511 11/24/2020 0143 12/11/2020 0431 11/28/2020 1700  WBC 7.2 6.9 7.1 7.2    Procedures/Operations     Sharad Vaneaton 12/02/20, 1:39 PM

## 2020-12-24 NOTE — Progress Notes (Signed)
Versed  wasted withness with Gevena Barre RN, morphine wasted with Gevena Barre RN

## 2020-12-24 NOTE — Progress Notes (Signed)
Patient Is asystole in 2 leads, no spontaneous breathing. No pulses auscultated or palpated. Son at bedside. Verified with  Gevena Barre RN

## 2020-12-24 DEATH — deceased
